# Patient Record
Sex: Female | Born: 1955
Health system: Southern US, Community
[De-identification: ages and names within clinical notes are randomized; demographics above are authoritative.]

## PROBLEM LIST (undated history)

## (undated) DIAGNOSIS — N950 Postmenopausal bleeding: Secondary | ICD-10-CM

## (undated) DIAGNOSIS — R9389 Abnormal findings on diagnostic imaging of other specified body structures: Secondary | ICD-10-CM

## (undated) DIAGNOSIS — Z780 Asymptomatic menopausal state: Secondary | ICD-10-CM

## (undated) DIAGNOSIS — C801 Malignant (primary) neoplasm, unspecified: Secondary | ICD-10-CM

## (undated) DIAGNOSIS — Z9221 Personal history of antineoplastic chemotherapy: Secondary | ICD-10-CM

## (undated) DIAGNOSIS — Z9889 Other specified postprocedural states: Secondary | ICD-10-CM

## (undated) DIAGNOSIS — J383 Other diseases of vocal cords: Secondary | ICD-10-CM

## (undated) DIAGNOSIS — B009 Herpesviral infection, unspecified: Secondary | ICD-10-CM

## (undated) DIAGNOSIS — Z8719 Personal history of other diseases of the digestive system: Secondary | ICD-10-CM

## (undated) DIAGNOSIS — F419 Anxiety disorder, unspecified: Secondary | ICD-10-CM

## (undated) DIAGNOSIS — M8580 Other specified disorders of bone density and structure, unspecified site: Secondary | ICD-10-CM

## (undated) DIAGNOSIS — R112 Nausea with vomiting, unspecified: Secondary | ICD-10-CM

## (undated) DIAGNOSIS — Z923 Personal history of irradiation: Secondary | ICD-10-CM

## (undated) HISTORY — DX: Other diseases of vocal cords: J38.3

## (undated) HISTORY — PX: ANAL FISTULECTOMY: SHX1139

## (undated) HISTORY — PX: KNEE SURGERY: SHX244

## (undated) HISTORY — PX: ENDOMETRIAL BIOPSY: SHX622

## (undated) HISTORY — DX: Abnormal findings on diagnostic imaging of other specified body structures: R93.89

## (undated) HISTORY — PX: OTHER SURGICAL HISTORY: SHX169

## (undated) HISTORY — DX: Other specified disorders of bone density and structure, unspecified site: M85.80

## (undated) HISTORY — DX: Asymptomatic menopausal state: Z78.0

## (undated) HISTORY — DX: Anxiety disorder, unspecified: F41.9

## (undated) HISTORY — DX: Postmenopausal bleeding: N95.0

## (undated) HISTORY — DX: Herpesviral infection, unspecified: B00.9

---

## 2005-01-12 ENCOUNTER — Ambulatory Visit: Payer: Self-pay | Admitting: Gynecology

## 2005-10-20 DIAGNOSIS — Z780 Asymptomatic menopausal state: Secondary | ICD-10-CM

## 2005-10-20 HISTORY — DX: Asymptomatic menopausal state: Z78.0

## 2006-06-18 ENCOUNTER — Ambulatory Visit: Payer: Self-pay | Admitting: Gynecology

## 2006-06-18 ENCOUNTER — Encounter (INDEPENDENT_AMBULATORY_CARE_PROVIDER_SITE_OTHER): Payer: Self-pay | Admitting: Gynecology

## 2006-06-29 ENCOUNTER — Ambulatory Visit: Payer: Self-pay | Admitting: Family Medicine

## 2006-07-13 ENCOUNTER — Ambulatory Visit: Payer: Self-pay | Admitting: Gynecology

## 2007-09-16 ENCOUNTER — Ambulatory Visit: Payer: Self-pay | Admitting: Gynecology

## 2007-09-16 ENCOUNTER — Encounter: Payer: Self-pay | Admitting: Family Medicine

## 2007-10-10 ENCOUNTER — Ambulatory Visit: Payer: Self-pay | Admitting: Gynecology

## 2009-03-02 ENCOUNTER — Ambulatory Visit: Payer: Self-pay | Admitting: Family Medicine

## 2009-03-02 ENCOUNTER — Encounter: Payer: Self-pay | Admitting: Family Medicine

## 2009-03-18 ENCOUNTER — Ambulatory Visit: Payer: Self-pay

## 2010-09-08 ENCOUNTER — Ambulatory Visit: Payer: BC Managed Care – PPO | Admitting: Obstetrics & Gynecology

## 2010-09-08 DIAGNOSIS — Z01419 Encounter for gynecological examination (general) (routine) without abnormal findings: Secondary | ICD-10-CM

## 2010-09-08 DIAGNOSIS — Z1272 Encounter for screening for malignant neoplasm of vagina: Secondary | ICD-10-CM
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
## 2010-10-04 NOTE — Assessment & Plan Note (Signed)
NAMEMANPREET, KEMMER               ACCOUNT NO.:  [id number]   MEDICAL RECORD NO.:  [id number]          PATIENT TYPE:  POB   LOCATION:  [facility] at [facility]         FACILITY:  [facility]   PHYSICIAN:  Ginger Carne, MD DATE OF BIRTH:  March 03, 1956   DATE OF SERVICE:  09/16/2007                                  CLINIC NOTE   Ms. Kimberly Clarke returns today for routine gynecological evaluation.  Her  principal complaint has been hot flashes with her last menses about 6-8  months ago. Preceding this time frame, she was having menses about every  3 months.  She also has vaginal dryness.  She has done some reading  regarding hormone replacement therapy and requested further information.  I had an extensive discussion with said patient regarding pros and cons  of hormone replacement therapy. Literature provided to said patient.  I  will order an FSH, LH and TSH as well, and she will get back to [place] if  she is interested she has no specific cardiac, GU or GI complaints.  She  is due for mammogram which will be scheduled.   The patient's physical examination revealed no specific findings, and  the reader is referred to the check off list.  The patient also was re-  prescribed Xanax 0.25 mg up to one a day as needed for anxiety, #30 with  four refills.  She was also counseled regarding osteoporosis and  recommendation to at least begin with 500 mg daily of calcium carbonate  or calcium citrate.  If she decides not to proceed with hormone  replacement therapy, I will discuss with the patient the option of  bisphosphonates and increasing her calcium to 500 mg with vitamin D  twice a day.           ______________________________  Ginger Carne, MD     SHB/MEDQ  D:  09/16/2007  T:  09/16/2007  Job:  [id number]

## 2010-10-12 ENCOUNTER — Other Ambulatory Visit (INDEPENDENT_AMBULATORY_CARE_PROVIDER_SITE_OTHER): Payer: BC Managed Care – PPO | Admitting: Family Medicine

## 2010-10-12 DIAGNOSIS — N950 Postmenopausal bleeding: Secondary | ICD-10-CM

## 2010-12-05 ENCOUNTER — Ambulatory Visit: Payer: Self-pay

## 2010-12-09 NOTE — Assessment & Plan Note (Signed)
Kimberly Clarke, Kimberly Clarke NO.:  [id number]  MEDICAL RECORD NO.:  [id number]          PATIENT TYPE:  LOCATION:  [facility] at [facility]           FACILITY:  PHYSICIAN:  Tinnie Gens, MD        DATE OF BIRTH:  [birth year]  DATE OF SERVICE:  10/12/2010                                 CLINIC NOTE  CHIEF COMPLAINT:  Postmenopausal bleeding.  HISTORY OF PRESENT ILLNESS:  The patient is a 55 year old nulliparous patient who has previously seen several weeks ago and had postmenopausal bleeding in February 2012.  She has been on HRT for 5 years and then had come off with that.  On her annual visit, she was noted to have a polyp coming from the cervix.  Biopsy of this revealed to be an endometrial polyp that was benign in nature.  She had bleeding from this for several days following removal of that, and we scheduled her back for completion of full endometrial sampling.  On exam, her vitals are as noted in the chart.  She is a well-developed and well-nourished female in no acute stress.  Abdomen soft, nontender, and nondistended.  GU:  Normal external female genitalia.  BUS is normal.  Vagina is pink and rugated. Cervix is nulliparous.  There are no lesions anymore.  PROCEDURE:  Cervix is cleaned with Betadine x2, grasped anteriorly with a single-tooth tenaculum.  It sounded to approximately 6 cm.  Pipelle was passed twice with minimal amount of tissue obtained probably from atrophy.  IMPRESSION:  Postmenopausal bleeding likely related to endometrial polyp, which has been removed, status post endometrial sampling today.  PLAN:  We will call the patient with results of this biopsy.          ______________________________ Tinnie Gens, MD    TP/MEDQ  D:  10/12/2010  T:  10/13/2010  Job:  [id number]
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
## 2012-03-04 ENCOUNTER — Ambulatory Visit (INDEPENDENT_AMBULATORY_CARE_PROVIDER_SITE_OTHER): Payer: BC Managed Care – PPO | Admitting: Family Medicine

## 2012-03-04 ENCOUNTER — Encounter: Payer: Self-pay | Admitting: Family Medicine

## 2012-03-04 VITALS — BP 115/78 | HR 75 | Ht 68.0 in | Wt 157.0 lb

## 2012-03-04 DIAGNOSIS — Z01419 Encounter for gynecological examination (general) (routine) without abnormal findings: Secondary | ICD-10-CM

## 2012-03-04 DIAGNOSIS — Z124 Encounter for screening for malignant neoplasm of cervix: Secondary | ICD-10-CM

## 2012-03-04 DIAGNOSIS — F411 Generalized anxiety disorder: Secondary | ICD-10-CM

## 2012-03-04 DIAGNOSIS — Z1151 Encounter for screening for human papillomavirus (HPV): Secondary | ICD-10-CM

## 2012-03-04 DIAGNOSIS — F419 Anxiety disorder, unspecified: Secondary | ICD-10-CM

## 2012-03-04 MED ORDER — ALPRAZOLAM 0.5 MG PO TABS: 0.2500 mg | ORAL_TABLET | Freq: Every evening | ORAL | Status: DC | PRN

## 2012-03-04 NOTE — Patient Instructions (Signed)
Preventive Care for Adults, Female A healthy lifestyle and preventive care can promote health and wellness. Preventive health guidelines for women include the following key practices.  A routine yearly physical is a good way to check with your caregiver about your health and preventive screening. It is a chance to share any concerns and updates on your health, and to receive a thorough exam.  Visit your dentist for a routine exam and preventive care every 6 months. Brush your teeth twice a day and floss once a day. Good oral hygiene prevents tooth decay and gum disease.  The frequency of eye exams is based on your age, health, family medical history, use of contact lenses, and other factors. Follow your caregiver's recommendations for frequency of eye exams.  Eat a healthy diet. Foods like vegetables, fruits, whole grains, low-fat dairy products, and lean protein foods contain the nutrients you need without too many calories. Decrease your intake of foods high in solid fats, added sugars, and salt. Eat the right amount of calories for you.Get information about a proper diet from your caregiver, if necessary.  Regular physical exercise is one of the most important things you can do for your health. Most adults should get at least 150 minutes of moderate-intensity exercise (any activity that increases your heart rate and causes you to sweat) each week. In addition, most adults need muscle-strengthening exercises on 2 or more days a week.  Maintain a healthy weight. The body mass index (BMI) is a screening tool to identify possible weight problems. It provides an estimate of body fat based on height and weight. Your caregiver can help determine your BMI, and can help you achieve or maintain a healthy weight.For adults 20 years and older:  A BMI below 18.5 is considered underweight.  A BMI of 18.5 to 24.9 is normal.  A BMI of 25 to 29.9 is considered overweight.  A BMI of 30 and above is  considered obese.  Maintain normal blood lipids and cholesterol levels by exercising and minimizing your intake of saturated fat. Eat a balanced diet with plenty of fruit and vegetables. Blood tests for lipids and cholesterol should begin at age 20 and be repeated every 5 years. If your lipid or cholesterol levels are high, you are over 50, or you are at high risk for heart disease, you may need your cholesterol levels checked more frequently.Ongoing high lipid and cholesterol levels should be treated with medicines if diet and exercise are not effective.  If you smoke, find out from your caregiver how to quit. If you do not use tobacco, do not start.  If you are pregnant, do not drink alcohol. If you are breastfeeding, be very cautious about drinking alcohol. If you are not pregnant and choose to drink alcohol, do not exceed 1 drink per day. One drink is considered to be 12 ounces (355 mL) of beer, 5 ounces (148 mL) of wine, or 1.5 ounces (44 mL) of liquor.  Avoid use of street drugs. Do not share needles with anyone. Ask for help if you need support or instructions about stopping the use of drugs.  High blood pressure causes heart disease and increases the risk of stroke. Your blood pressure should be checked at least every 1 to 2 years. Ongoing high blood pressure should be treated with medicines if weight loss and exercise are not effective.  If you are 55 to 54 years old, ask your caregiver if you should take aspirin to prevent strokes.  Diabetes   screening involves taking a blood sample to check your fasting blood sugar level. This should be done once every 3 years, after age 45, if you are within normal weight and without risk factors for diabetes. Testing should be considered at a younger age or be carried out more frequently if you are overweight and have at least 1 risk factor for diabetes.  Breast cancer screening is essential preventive care for women. You should practice "breast  self-awareness." This means understanding the normal appearance and feel of your breasts and may include breast self-examination. Any changes detected, no matter how small, should be reported to a caregiver. Women in their 20s and 30s should have a clinical breast exam (CBE) by a caregiver as part of a regular health exam every 1 to 3 years. After age 40, women should have a CBE every year. Starting at age 40, women should consider having a mammography (breast X-ray test) every year. Women who have a family history of breast cancer should talk to their caregiver about genetic screening. Women at a high risk of breast cancer should talk to their caregivers about having magnetic resonance imaging (MRI) and a mammography every year.  The Pap test is a screening test for cervical cancer. A Pap test can show cell changes on the cervix that might become cervical cancer if left untreated. A Pap test is a procedure in which cells are obtained and examined from the lower end of the uterus (cervix).  Women should have a Pap test starting at age 21.  Between ages 21 and 29, Pap tests should be repeated every 2 years.  Beginning at age 30, you should have a Pap test every 3 years as long as the past 3 Pap tests have been normal.  Some women have medical problems that increase the chance of getting cervical cancer. Talk to your caregiver about these problems. It is especially important to talk to your caregiver if a new problem develops soon after your last Pap test. In these cases, your caregiver may recommend more frequent screening and Pap tests.  The above recommendations are the same for women who have or have not gotten the vaccine for human papillomavirus (HPV).  If you had a hysterectomy for a problem that was not cancer or a condition that could lead to cancer, then you no longer need Pap tests. Even if you no longer need a Pap test, a regular exam is a good idea to make sure no other problems are  starting.  If you are between ages 65 and 70, and you have had normal Pap tests going back 10 years, you no longer need Pap tests. Even if you no longer need a Pap test, a regular exam is a good idea to make sure no other problems are starting.  If you have had past treatment for cervical cancer or a condition that could lead to cancer, you need Pap tests and screening for cancer for at least 20 years after your treatment.  If Pap tests have been discontinued, risk factors (such as a new sexual partner) need to be reassessed to determine if screening should be resumed.  The HPV test is an additional test that may be used for cervical cancer screening. The HPV test looks for the virus that can cause the cell changes on the cervix. The cells collected during the Pap test can be tested for HPV. The HPV test could be used to screen women aged 30 years and older, and should   be used in women of any age who have unclear Pap test results. After the age of 30, women should have HPV testing at the same frequency as a Pap test.  Colorectal cancer can be detected and often prevented. Most routine colorectal cancer screening begins at the age of 50 and continues through age 75. However, your caregiver may recommend screening at an earlier age if you have risk factors for colon cancer. On a yearly basis, your caregiver may provide home test kits to check for hidden blood in the stool. Use of a small camera at the end of a tube, to directly examine the colon (sigmoidoscopy or colonoscopy), can detect the earliest forms of colorectal cancer. Talk to your caregiver about this at age 50, when routine screening begins. Direct examination of the colon should be repeated every 5 to 10 years through age 75, unless early forms of pre-cancerous polyps or small growths are found.  Hepatitis C blood testing is recommended for all people born from 1945 through 1965 and any individual with known risks for hepatitis C.  Practice  safe sex. Use condoms and avoid high-risk sexual practices to reduce the spread of sexually transmitted infections (STIs). STIs include gonorrhea, chlamydia, syphilis, trichomonas, herpes, HPV, and human immunodeficiency virus (HIV). Herpes, HIV, and HPV are viral illnesses that have no cure. They can result in disability, cancer, and death. Sexually active women aged 25 and younger should be checked for chlamydia. Older women with new or multiple partners should also be tested for chlamydia. Testing for other STIs is recommended if you are sexually active and at increased risk.  Osteoporosis is a disease in which the bones lose minerals and strength with aging. This can result in serious bone fractures. The risk of osteoporosis can be identified using a bone density scan. Women ages 65 and over and women at risk for fractures or osteoporosis should discuss screening with their caregivers. Ask your caregiver whether you should take a calcium supplement or vitamin D to reduce the rate of osteoporosis.  Menopause can be associated with physical symptoms and risks. Hormone replacement therapy is available to decrease symptoms and risks. You should talk to your caregiver about whether hormone replacement therapy is right for you.  Use sunscreen with sun protection factor (SPF) of 30 or more. Apply sunscreen liberally and repeatedly throughout the day. You should seek shade when your shadow is shorter than you. Protect yourself by wearing long sleeves, pants, a wide-brimmed hat, and sunglasses year round, whenever you are outdoors.  Once a month, do a whole body skin exam, using a mirror to look at the skin on your back. Notify your caregiver of new moles, moles that have irregular borders, moles that are larger than a pencil eraser, or moles that have changed in shape or color.  Stay current with required immunizations.  Influenza. You need a dose every fall (or winter). The composition of the flu vaccine  changes each year, so being vaccinated once is not enough.  Pneumococcal polysaccharide. You need 1 to 2 doses if you smoke cigarettes or if you have certain chronic medical conditions. You need 1 dose at age 65 (or older) if you have never been vaccinated.  Tetanus, diphtheria, pertussis (Tdap, Td). Get 1 dose of Tdap vaccine if you are younger than age 65, are over 65 and have contact with an infant, are a healthcare worker, are pregnant, or simply want to be protected from whooping cough. After that, you need a Td   booster dose every 10 years. Consult your caregiver if you have not had at least 3 tetanus and diphtheria-containing shots sometime in your life or have a deep or dirty wound.  HPV. You need this vaccine if you are a woman age 26 or younger. The vaccine is given in 3 doses over 6 months.  Measles, mumps, rubella (MMR). You need at least 1 dose of MMR if you were born in 1957 or later. You may also need a second dose.  Meningococcal. If you are age 19 to 21 and a first-year college student living in a residence hall, or have one of several medical conditions, you need to get vaccinated against meningococcal disease. You may also need additional booster doses.  Zoster (shingles). If you are age 60 or older, you should get this vaccine.  Varicella (chickenpox). If you have never had chickenpox or you were vaccinated but received only 1 dose, talk to your caregiver to find out if you need this vaccine.  Hepatitis A. You need this vaccine if you have a specific risk factor for hepatitis A virus infection or you simply wish to be protected from this disease. The vaccine is usually given as 2 doses, 6 to 18 months apart.  Hepatitis B. You need this vaccine if you have a specific risk factor for hepatitis B virus infection or you simply wish to be protected from this disease. The vaccine is given in 3 doses, usually over 6 months. Preventive Services / Frequency Ages 19 to 39  Blood  pressure check.** / Every 1 to 2 years.  Lipid and cholesterol check.** / Every 5 years beginning at age 20.  Clinical breast exam.** / Every 3 years for women in their 20s and 30s.  Pap test.** / Every 2 years from ages 21 through 29. Every 3 years starting at age 30 through age 65 or 70 with a history of 3 consecutive normal Pap tests.  HPV screening.** / Every 3 years from ages 30 through ages 65 to 70 with a history of 3 consecutive normal Pap tests.  Hepatitis C blood test.** / For any individual with known risks for hepatitis C.  Skin self-exam. / Monthly.  Influenza immunization.** / Every year.  Pneumococcal polysaccharide immunization.** / 1 to 2 doses if you smoke cigarettes or if you have certain chronic medical conditions.  Tetanus, diphtheria, pertussis (Tdap, Td) immunization. / A one-time dose of Tdap vaccine. After that, you need a Td booster dose every 10 years.  HPV immunization. / 3 doses over 6 months, if you are 26 and younger.  Measles, mumps, rubella (MMR) immunization. / You need at least 1 dose of MMR if you were born in 1957 or later. You may also need a second dose.  Meningococcal immunization. / 1 dose if you are age 19 to 21 and a first-year college student living in a residence hall, or have one of several medical conditions, you need to get vaccinated against meningococcal disease. You may also need additional booster doses.  Varicella immunization.** / Consult your caregiver.  Hepatitis A immunization.** / Consult your caregiver. 2 doses, 6 to 18 months apart.  Hepatitis B immunization.** / Consult your caregiver. 3 doses usually over 6 months. Ages 40 to 64  Blood pressure check.** / Every 1 to 2 years.  Lipid and cholesterol check.** / Every 5 years beginning at age 20.  Clinical breast exam.** / Every year after age 40.  Mammogram.** / Every year beginning at age 40   and continuing for as long as you are in good health. Consult with your  caregiver.  Pap test.** / Every 3 years starting at age 30 through age 65 or 70 with a history of 3 consecutive normal Pap tests.  HPV screening.** / Every 3 years from ages 30 through ages 65 to 70 with a history of 3 consecutive normal Pap tests.  Fecal occult blood test (FOBT) of stool. / Every year beginning at age 50 and continuing until age 75. You may not need to do this test if you get a colonoscopy every 10 years.  Flexible sigmoidoscopy or colonoscopy.** / Every 5 years for a flexible sigmoidoscopy or every 10 years for a colonoscopy beginning at age 50 and continuing until age 75.  Hepatitis C blood test.** / For all people born from 1945 through 1965 and any individual with known risks for hepatitis C.  Skin self-exam. / Monthly.  Influenza immunization.** / Every year.  Pneumococcal polysaccharide immunization.** / 1 to 2 doses if you smoke cigarettes or if you have certain chronic medical conditions.  Tetanus, diphtheria, pertussis (Tdap, Td) immunization.** / A one-time dose of Tdap vaccine. After that, you need a Td booster dose every 10 years.  Measles, mumps, rubella (MMR) immunization. / You need at least 1 dose of MMR if you were born in 1957 or later. You may also need a second dose.  Varicella immunization.** / Consult your caregiver.  Meningococcal immunization.** / Consult your caregiver.  Hepatitis A immunization.** / Consult your caregiver. 2 doses, 6 to 18 months apart.  Hepatitis B immunization.** / Consult your caregiver. 3 doses, usually over 6 months. Ages 65 and over  Blood pressure check.** / Every 1 to 2 years.  Lipid and cholesterol check.** / Every 5 years beginning at age 20.  Clinical breast exam.** / Every year after age 40.  Mammogram.** / Every year beginning at age 40 and continuing for as long as you are in good health. Consult with your caregiver.  Pap test.** / Every 3 years starting at age 30 through age 65 or 70 with a 3  consecutive normal Pap tests. Testing can be stopped between 65 and 70 with 3 consecutive normal Pap tests and no abnormal Pap or HPV tests in the past 10 years.  HPV screening.** / Every 3 years from ages 30 through ages 65 or 70 with a history of 3 consecutive normal Pap tests. Testing can be stopped between 65 and 70 with 3 consecutive normal Pap tests and no abnormal Pap or HPV tests in the past 10 years.  Fecal occult blood test (FOBT) of stool. / Every year beginning at age 50 and continuing until age 75. You may not need to do this test if you get a colonoscopy every 10 years.  Flexible sigmoidoscopy or colonoscopy.** / Every 5 years for a flexible sigmoidoscopy or every 10 years for a colonoscopy beginning at age 50 and continuing until age 75.  Hepatitis C blood test.** / For all people born from 1945 through 1965 and any individual with known risks for hepatitis C.  Osteoporosis screening.** / A one-time screening for women ages 65 and over and women at risk for fractures or osteoporosis.  Skin self-exam. / Monthly.  Influenza immunization.** / Every year.  Pneumococcal polysaccharide immunization.** / 1 dose at age 65 (or older) if you have never been vaccinated.  Tetanus, diphtheria, pertussis (Tdap, Td) immunization. / A one-time dose of Tdap vaccine if you are over   65 and have contact with an infant, are a healthcare worker, or simply want to be protected from whooping cough. After that, you need a Td booster dose every 10 years.  Varicella immunization.** / Consult your caregiver.  Meningococcal immunization.** / Consult your caregiver.  Hepatitis A immunization.** / Consult your caregiver. 2 doses, 6 to 18 months apart.  Hepatitis B immunization.** / Check with your caregiver. 3 doses, usually over 6 months. ** Family history and personal history of risk and conditions may change your caregiver's recommendations. Document Released: 07/04/2001 Document Revised: 07/31/2011  Document Reviewed: 10/03/2010 [employer] Patient Information 2013 [employer].  

## 2012-03-04 NOTE — Progress Notes (Signed)
  Subjective:     Kimberly Clarke is a 56 y.o. female and is here for a comprehensive physical exam. The patient reports no problems.  No cycles for some time.  Removed cervical polyp at last year's annual exam.  Has some hemorrhoids, on fiber and stool softener.  History   Social History  . Marital Status: Married    Spouse Name: N/A    Number of Children: N/A  . Years of Education: N/A   Occupational History  . Not on file.   Social History Main Topics  . Smoking status: Never Smoker   . Smokeless tobacco: Not on file  . Alcohol Use: Not on file     occasion  . Drug Use: No     In early 20  . Sexually Active: Yes -- Female partner(s)    Birth Control/ Protection: Post-menopausal   Other Topics Concern  . Not on file   Social History Narrative  . No narrative on file   Health Maintenance  Topic Date Due  . Pap Smear  02/29/1976  . Tetanus/tdap  02/28/1977  . Mammogram  02/29/2008  . Colonoscopy  02/29/2008  . Influenza Vaccine  01/21/2012    The following portions of the patient's history were reviewed and updated as appropriate: allergies, current medications, past family history, past medical history, past social history, past surgical history and problem list.  Review of Systems A comprehensive review of systems was negative.   Objective:    BP 115/78  Pulse 75  Ht 5\' 8"  (1.727 m)  Wt 157 lb (71.215 kg)  BMI 23.87 kg/m2 General appearance: alert, cooperative and appears stated age Neck: no adenopathy, supple, symmetrical, trachea midline and thyroid not enlarged, symmetric, no tenderness/mass/nodules Lungs: clear to auscultation bilaterally Breasts: normal appearance, no masses or tenderness Heart: regular rate and rhythm, S1, S2 normal, no murmur, click, rub or gallop Abdomen: soft, non-tender; bowel sounds normal; no masses,  no organomegaly Pelvic: cervix normal in appearance, external genitalia normal, no adnexal masses or tenderness, no cervical motion  tenderness, uterus normal size, shape, and consistency and vagina normal without discharge Extremities: extremities normal, atraumatic, no cyanosis or edema Pulses: 2+ and symmetric Skin: Skin color, texture, turgor normal. No rashes or lesions Lymph nodes: Cervical, supraclavicular, and axillary nodes normal. Neurologic: Grossly normal    Assessment:    Healthy female exam. Postmenopausal     Plan:    Pap smear Mammogram Referral for colonoscopy See After Visit Summary for Counseling Recommendations

## 2012-03-19 ENCOUNTER — Ambulatory Visit: Payer: Self-pay

## 2012-03-20 ENCOUNTER — Encounter: Payer: Self-pay | Admitting: Family Medicine

## 2013-03-11 ENCOUNTER — Telehealth: Payer: Self-pay | Admitting: *Deleted

## 2013-03-11 NOTE — Telephone Encounter (Signed)
Patient would like rx for xanax called back into her pharmacy as she never had to get it filled from her last visit and it has now expired.  It was last filled in 2012 according to the pharmacist.  She is very upset as her husband passed away suddenly about two months ago from a heart attack while they were at home.

## 2013-10-07 ENCOUNTER — Ambulatory Visit (INDEPENDENT_AMBULATORY_CARE_PROVIDER_SITE_OTHER): Payer: BC Managed Care – PPO | Admitting: Obstetrics & Gynecology

## 2013-10-07 ENCOUNTER — Encounter: Payer: Self-pay | Admitting: Obstetrics & Gynecology

## 2013-10-07 VITALS — BP 137/86 | HR 80 | Resp 20 | Ht 68.0 in | Wt 161.0 lb

## 2013-10-07 DIAGNOSIS — G4700 Insomnia, unspecified: Secondary | ICD-10-CM

## 2013-10-07 DIAGNOSIS — Z01419 Encounter for gynecological examination (general) (routine) without abnormal findings: Secondary | ICD-10-CM

## 2013-10-07 DIAGNOSIS — Z1151 Encounter for screening for human papillomavirus (HPV): Secondary | ICD-10-CM

## 2013-10-07 DIAGNOSIS — Z124 Encounter for screening for malignant neoplasm of cervix: Secondary | ICD-10-CM

## 2013-10-07 MED ORDER — ALPRAZOLAM 0.5 MG PO TABS: 0.2500 mg | ORAL_TABLET | Freq: Every evening | ORAL | Status: DC | PRN

## 2013-10-07 NOTE — Progress Notes (Signed)
    GYNECOLOGY CLINIC ANNUAL PREVENTATIVE CARE ENCOUNTER NOTE  Subjective:     Kimberly Clarke is a 58 y.o. G0P0000 female here for a routine annual gynecologic exam.  Current complaints: no GYN concerns.  Lost her husband in August 2014; having a hard time dealing with this. Requests Xanax refill for help with anxiety and insomnia. Went to grief counseling, declines any further help.   Gynecologic History No LMP recorded. Patient is postmenopausal. Last Pap: 03/04/12. Results were: normal Last mammogram: 2013. Results were: normal Has not scheduled colonoscopy yet, does not referral now.  The following portions of the patient's history were reviewed and updated as appropriate: allergies, current medications, past family history, past medical history, past social history, past surgical history and problem list.   Review of Systems Pertinent items are noted in HPI.    Objective:   BP 137/86  Pulse 80  Resp 20  Ht 5\' 8"  (1.727 m)  Wt 161 lb (73.029 kg)  BMI 24.49 kg/m2 GENERAL: Well-developed, well-nourished female in no acute distress.  HEENT: Normocephalic, atraumatic. Sclerae anicteric.  NECK: Supple. Normal thyroid.  LUNGS: Clear to auscultation bilaterally.  HEART: Regular rate and rhythm. BREASTS: Symmetric in size. No masses, skin changes, nipple drainage, or lymphadenopathy. ABDOMEN: Soft, nontender, nondistended. No organomegaly. PELVIC: Normal external female genitalia. Vagina is pink and rugated.  Normal discharge. Normal cervix contour. Pap smear obtained. Uterus is normal in size. No adnexal mass or tenderness.  EXTREMITIES: No cyanosis, clubbing, or edema, 2+ distal pulses.   Assessment:   Annual gynecologic examination   Plan:   Pap done, will follow up results and manage accordingly. Mammogram scheduled Xanax refilled. Patient told to call if she decides to see a counselor. Routine preventative health maintenance measures emphasized   Verita Schneiders, MD,  [place] [facility] [place], [place]

## 2013-10-07 NOTE — Patient Instructions (Addendum)
RE: MyChart  Dear Ms. Kimberly Clarke  We are excited to introduce MyChart, a new best-in-class service that provides you online access to important information in your electronic medical record. We want to make it easier for you to view your health information - all in one secure location - when and where you need it. We expect MyChart will enhance the quality of care and service we provide. Use the activation code below to enroll in MyChart online at [email or web address]  When you register for MyChart, you can:    View your test results.   Communicate securely with your physician's office.    View your medical history, allergies, medications, and immunizations.   Conveniently print information such as your medication lists.  If you are age 56 or older and want a member of your family to have access to your record, you must provide written consent by completing a proxy form available at our facility. Please speak to our clinical staff about guidelines regarding accounts for patients younger than age 88.  As you activate your MyChart account and need any technical assistance, please call the MyChart technical support line at [phone]) or email your question to [email or web address]. If you email your question(s), please include your name, a return phone number and the best time to reach you.  Thank you for using MyChart as your new health and wellness resource!  MyChart Activation Code:  [email or web address] Expires: 12/06/2013  2:31 PM      Preventive Care for Adults, Female A healthy lifestyle and preventive care can promote health and wellness. Preventive health guidelines for women include the following key practices.  A routine yearly physical is a good way to check with your health care provider about your health and preventive screening. It is a chance to share any concerns and updates on your health and to receive a thorough exam.  Visit your dentist for a  routine exam and preventive care every 6 months. Brush your teeth twice a day and floss once a day. Good oral hygiene prevents tooth decay and gum disease.  The frequency of eye exams is based on your age, health, family medical history, use of contact lenses, and other factors. Follow your health care provider's recommendations for frequency of eye exams.  Eat a healthy diet. Foods like vegetables, fruits, whole grains, low-fat dairy products, and lean protein foods contain the nutrients you need without too many calories. Decrease your intake of foods high in solid fats, added sugars, and salt. Eat the right amount of calories for you.Get information about a proper diet from your health care provider, if necessary.  Regular physical exercise is one of the most important things you can do for your health. Most adults should get at least 150 minutes of moderate-intensity exercise (any activity that increases your heart rate and causes you to sweat) each week. In addition, most adults need muscle-strengthening exercises on 2 or more days a week.  Maintain a healthy weight. The body mass index (BMI) is a screening tool to identify possible weight problems. It provides an estimate of body fat based on height and weight. Your health care provider can find your BMI, and can help you achieve or maintain a healthy weight.For adults 20 years and older:  A BMI below 18.5 is considered underweight.  A BMI of 18.5 to 24.9 is normal.  A BMI of 25 to 29.9 is considered overweight.  A BMI of 30 and above is considered obese.  Maintain normal blood  lipids and cholesterol levels by exercising and minimizing your intake of saturated fat. Eat a balanced diet with plenty of fruit and vegetables. Blood tests for lipids and cholesterol should begin at age 56 and be repeated every 5 years. If your lipid or cholesterol levels are high, you are over 50, or you are at high risk for heart disease, you may need your  cholesterol levels checked more frequently.Ongoing high lipid and cholesterol levels should be treated with medicines if diet and exercise are not working.  If you smoke, find out from your health care provider how to quit. If you do not use tobacco, do not start.  Lung cancer screening is recommended for adults aged 56 80 years who are at high risk for developing lung cancer because of a history of smoking. A yearly low-dose CT scan of the lungs is recommended for people who have at least a 30-pack-year history of smoking and are a current smoker or have quit within the past 15 years. A pack year of smoking is smoking an average of 1 pack of cigarettes a day for 1 year (for example: 1 pack a day for 30 years or 2 packs a day for 15 years). Yearly screening should continue until the smoker has stopped smoking for at least 15 years. Yearly screening should be stopped for people who develop a health problem that would prevent them from having lung cancer treatment.  If you are pregnant, do not drink alcohol. If you are breastfeeding, be very cautious about drinking alcohol. If you are not pregnant and choose to drink alcohol, do not have more than 1 drink per day. One drink is considered to be 12 ounces (355 mL) of beer, 5 ounces (148 mL) of wine, or 1.5 ounces (44 mL) of liquor.  Avoid use of street drugs. Do not share needles with anyone. Ask for help if you need support or instructions about stopping the use of drugs.  High blood pressure causes heart disease and increases the risk of stroke. Your blood pressure should be checked at least every 1 to 2 years. Ongoing high blood pressure should be treated with medicines if weight loss and exercise do not work.  If you are 56 56 years old, ask your health care provider if you should take aspirin to prevent strokes.  Diabetes screening involves taking a blood sample to check your fasting blood sugar level. This should be done once every 3 years, after  age 14, if you are within normal weight and without risk factors for diabetes. Testing should be considered at a younger age or be carried out more frequently if you are overweight and have at least 1 risk factor for diabetes.  Breast cancer screening is essential preventive care for women. You should practice "breast self-awareness." This means understanding the normal appearance and feel of your breasts and may include breast self-examination. Any changes detected, no matter how small, should be reported to a health care provider. Women in their 3s and 30s should have a clinical breast exam (CBE) by a health care provider as part of a regular health exam every 1 to 3 years. After age 80, women should have a CBE every year. Starting at age 30, women should consider having a mammogram (breast X-ray test) every year. Women who have a family history of breast cancer should talk to their health care provider about genetic screening. Women at a high risk of breast cancer should talk to their health care providers about  having an MRI and a mammogram every year.  Breast cancer gene (BRCA)-related cancer risk assessment is recommended for women who have family members with BRCA-related cancers. BRCA-related cancers include breast, ovarian, tubal, and peritoneal cancers. Having family members with these cancers may be associated with an increased risk for harmful changes (mutations) in the breast cancer genes BRCA1 and BRCA2. Results of the assessment will determine the need for genetic counseling and BRCA1 and BRCA2 testing.  The Pap test is a screening test for cervical cancer. A Pap test can show cell changes on the cervix that might become cervical cancer if left untreated. A Pap test is a procedure in which cells are obtained and examined from the lower end of the uterus (cervix).  Women should have a Pap test starting at age 25.  Between ages 28 and 33, Pap tests should be repeated every 2  years.  Beginning at age 27, you should have a Pap test every 3 years as long as the past 3 Pap tests have been normal.  Some women have medical problems that increase the chance of getting cervical cancer. Talk to your health care provider about these problems. It is especially important to talk to your health care provider if a new problem develops soon after your last Pap test. In these cases, your health care provider may recommend more frequent screening and Pap tests.  The above recommendations are the same for women who have or have not gotten the vaccine for human papillomavirus (HPV).  If you had a hysterectomy for a problem that was not cancer or a condition that could lead to cancer, then you no longer need Pap tests. Even if you no longer need a Pap test, a regular exam is a good idea to make sure no other problems are starting.  If you are between ages 29 and 45 years, and you have had normal Pap tests going back 10 years, you no longer need Pap tests. Even if you no longer need a Pap test, a regular exam is a good idea to make sure no other problems are starting.  If you have had past treatment for cervical cancer or a condition that could lead to cancer, you need Pap tests and screening for cancer for at least 20 years after your treatment.  If Pap tests have been discontinued, risk factors (such as a new sexual partner) need to be reassessed to determine if screening should be resumed.  The HPV test is an additional test that may be used for cervical cancer screening. The HPV test looks for the virus that can cause the cell changes on the cervix. The cells collected during the Pap test can be tested for HPV. The HPV test could be used to screen women aged 36 years and older, and should be used in women of any age who have unclear Pap test results. After the age of 60, women should have HPV testing at the same frequency as a Pap test.  Colorectal cancer can be detected and often  prevented. Most routine colorectal cancer screening begins at the age of 80 years and continues through age 72 years. However, your health care provider may recommend screening at an earlier age if you have risk factors for colon cancer. On a yearly basis, your health care provider may provide home test kits to check for hidden blood in the stool. Use of a small camera at the end of a tube, to directly examine the colon (sigmoidoscopy or  colonoscopy), can detect the earliest forms of colorectal cancer. Talk to your health care provider about this at age 46, when routine screening begins. Direct exam of the colon should be repeated every 5 10 years through age 27 years, unless early forms of pre-cancerous polyps or small growths are found.  People who are at an increased risk for hepatitis B should be screened for this virus. You are considered at high risk for hepatitis B if:  You were born in a country where hepatitis B occurs often. Talk with your health care provider about which countries are considered high risk.  Your parents were born in a high-risk country and you have not received a shot to protect against hepatitis B (hepatitis B vaccine).  You have HIV or AIDS.  You use needles to inject street drugs.  You live with, or have sex with, someone who has Hepatitis B.  You get hemodialysis treatment.  You take certain medicines for conditions like cancer, organ transplantation, and autoimmune conditions.  Hepatitis C blood testing is recommended for all people born from [birth year] through 1963 and any individual with known risks for hepatitis C.  Practice safe sex. Use condoms and avoid high-risk sexual practices to reduce the spread of sexually transmitted infections (STIs). STIs include gonorrhea, chlamydia, syphilis, trichomonas, herpes, HPV, and human immunodeficiency virus (HIV). Herpes, HIV, and HPV are viral illnesses that have no cure. They can result in disability, cancer, and death.  Sexually active women aged 17 years and younger should be checked for chlamydia. Older women with new or multiple partners should also be tested for chlamydia. Testing for other STIs is recommended if you are sexually active and at increased risk.  Osteoporosis is a disease in which the bones lose minerals and strength with aging. This can result in serious bone fractures or breaks. The risk of osteoporosis can be identified using a bone density scan. Women ages 38 years and over and women at risk for fractures or osteoporosis should discuss screening with their health care providers. Ask your health care provider whether you should take a calcium supplement or vitamin D to reduce the rate of osteoporosis.  Menopause can be associated with physical symptoms and risks. Hormone replacement therapy is available to decrease symptoms and risks. You should talk to your health care provider about whether hormone replacement therapy is right for you.  Use sunscreen. Apply sunscreen liberally and repeatedly throughout the day. You should seek shade when your shadow is shorter than you. Protect yourself by wearing long sleeves, pants, a wide-brimmed hat, and sunglasses year round, whenever you are outdoors.  Once a month, do a whole body skin exam, using a mirror to look at the skin on your back. Tell your health care provider of new moles, moles that have irregular borders, moles that are larger than a pencil eraser, or moles that have changed in shape or color.  Stay current with required vaccines (immunizations).  Influenza vaccine. All adults should be immunized every year.  Tetanus, diphtheria, and acellular pertussis (Td, Tdap) vaccine. Pregnant women should receive 1 dose of Tdap vaccine during each pregnancy. The dose should be obtained regardless of the length of time since the last dose. Immunization is preferred during the 27th 36th week of gestation. An adult who has not previously received Tdap or  who does not know her vaccine status should receive 1 dose of Tdap. This initial dose should be followed by tetanus and diphtheria toxoids (Td) booster doses every 10  years. Adults with an unknown or incomplete history of completing a 3-dose immunization series with Td-containing vaccines should begin or complete a primary immunization series including a Tdap dose. Adults should receive a Td booster every 10 years.  Varicella vaccine. An adult without evidence of immunity to varicella should receive 2 doses or a second dose if she has previously received 1 dose. Pregnant females who do not have evidence of immunity should receive the first dose after pregnancy. This first dose should be obtained before leaving the health care facility. The second dose should be obtained 4 8 weeks after the first dose.  Human papillomavirus (HPV) vaccine. Females aged 63 26 years who have not received the vaccine previously should obtain the 3-dose series. The vaccine is not recommended for use in pregnant females. However, pregnancy testing is not needed before receiving a dose. If a female is found to be pregnant after receiving a dose, no treatment is needed. In that case, the remaining doses should be delayed until after the pregnancy. Immunization is recommended for any person with an immunocompromised condition through the age of 95 years if she did not get any or all doses earlier. During the 3-dose series, the second dose should be obtained 4 8 weeks after the first dose. The third dose should be obtained 24 weeks after the first dose and 16 weeks after the second dose.  Zoster vaccine. One dose is recommended for adults aged 32 years or older unless certain conditions are present.  Measles, mumps, and rubella (MMR) vaccine. Adults born before [birth year] generally are considered immune to measles and mumps. Adults born in [birth year] or later should have 1 or more doses of MMR vaccine unless there is a contraindication to the  vaccine or there is laboratory evidence of immunity to each of the three diseases. A routine second dose of MMR vaccine should be obtained at least 28 days after the first dose for students attending postsecondary schools, health care workers, or international travelers. People who received inactivated measles vaccine or an unknown type of measles vaccine during 1963 1967 should receive 2 doses of MMR vaccine. People who received inactivated mumps vaccine or an unknown type of mumps vaccine before 1979 and are at high risk for mumps infection should consider immunization with 2 doses of MMR vaccine. For females of childbearing age, rubella immunity should be determined. If there is no evidence of immunity, females who are not pregnant should be vaccinated. If there is no evidence of immunity, females who are pregnant should delay immunization until after pregnancy. Unvaccinated health care workers born before [birth year] who lack laboratory evidence of measles, mumps, or rubella immunity or laboratory confirmation of disease should consider measles and mumps immunization with 2 doses of MMR vaccine or rubella immunization with 1 dose of MMR vaccine.  Pneumococcal 13-valent conjugate (PCV13) vaccine. When indicated, a person who is uncertain of her immunization history and has no record of immunization should receive the PCV13 vaccine. An adult aged 29 years or older who has certain medical conditions and has not been previously immunized should receive 1 dose of PCV13 vaccine. This PCV13 should be followed with a dose of pneumococcal polysaccharide (PPSV23) vaccine. The PPSV23 vaccine dose should be obtained at least 8 weeks after the dose of PCV13 vaccine. An adult aged 67 years or older who has certain medical conditions and previously received 1 or more doses of PPSV23 vaccine should receive 1 dose of PCV13. The PCV13 vaccine dose should be obtained  1 or more years after the last PPSV23 vaccine dose.  Pneumococcal  polysaccharide (PPSV23) vaccine. When PCV13 is also indicated, PCV13 should be obtained first. All adults aged 31 years and older should be immunized. An adult younger than age 17 years who has certain medical conditions should be immunized. Any person who resides in a nursing home or long-term care facility should be immunized. An adult smoker should be immunized. People with an immunocompromised condition and certain other conditions should receive both PCV13 and PPSV23 vaccines. People with human immunodeficiency virus (HIV) infection should be immunized as soon as possible after diagnosis. Immunization during chemotherapy or radiation therapy should be avoided. Routine use of PPSV23 vaccine is not recommended for American Indians, [place] Natives, or people younger than 65 years unless there are medical conditions that require PPSV23 vaccine. When indicated, people who have unknown immunization and have no record of immunization should receive PPSV23 vaccine. One-time revaccination 5 years after the first dose of PPSV23 is recommended for people aged 59 64 years who have chronic kidney failure, nephrotic syndrome, asplenia, or immunocompromised conditions. People who received 1 2 doses of PPSV23 before age 58 years should receive another dose of PPSV23 vaccine at age 33 years or later if at least 5 years have passed since the previous dose. Doses of PPSV23 are not needed for people immunized with PPSV23 at or after age 77 years.  Meningococcal vaccine. Adults with asplenia or persistent complement component deficiencies should receive 2 doses of quadrivalent meningococcal conjugate (MenACWY-D) vaccine. The doses should be obtained at least 2 months apart. Microbiologists working with certain meningococcal bacteria, [employer] recruits, people at risk during an outbreak, and people who travel to or live in countries with a high rate of meningitis should be immunized. A first-year college student up through age 48  years who is living in a residence hall should receive a dose if she did not receive a dose on or after her 16th birthday. Adults who have certain high-risk conditions should receive one or more doses of vaccine.  Hepatitis A vaccine. Adults who wish to be protected from this disease, have certain high-risk conditions, work with hepatitis A-infected animals, work in hepatitis A research labs, or travel to or work in countries with a high rate of hepatitis A should be immunized. Adults who were previously unvaccinated and who anticipate close contact with an international adoptee during the first 60 days after arrival in the [place] from a country with a high rate of hepatitis A should be immunized.  Hepatitis B vaccine. Adults who wish to be protected from this disease, have certain high-risk conditions, may be exposed to blood or other infectious body fluids, are household contacts or sex partners of hepatitis B positive people, are clients or workers in certain care facilities, or travel to or work in countries with a high rate of hepatitis B should be immunized.  Haemophilus influenzae type b (Hib) vaccine. A previously unvaccinated person with asplenia or sickle cell disease or having a scheduled splenectomy should receive 1 dose of Hib vaccine. Regardless of previous immunization, a recipient of a hematopoietic stem cell transplant should receive a 3-dose series 6 12 months after her successful transplant. Hib vaccine is not recommended for adults with HIV infection. Preventive Services / Frequency Ages 30 to 39years  Blood pressure check.** / Every 1 to 2 years.  Lipid and cholesterol check.** / Every 5 years beginning at age 26.  Clinical breast exam.** / Every 3 years for  women in their 54s and 8s.  BRCA-related cancer risk assessment.** / For women who have family members with a BRCA-related cancer (breast, ovarian, tubal, or peritoneal cancers).  Pap test.** / Every 2 years from  ages 7 through 33. Every 3 years starting at age 25 through age 82 or 32 with a history of 3 consecutive normal Pap tests.  HPV screening.** / Every 3 years from ages 6 through ages 4 to 80 with a history of 3 consecutive normal Pap tests.  Hepatitis C blood test.** / For any individual with known risks for hepatitis C.  Skin self-exam. / Monthly.  Influenza vaccine. / Every year.  Tetanus, diphtheria, and acellular pertussis (Tdap, Td) vaccine.** / Consult your health care provider. Pregnant women should receive 1 dose of Tdap vaccine during each pregnancy. 1 dose of Td every 10 years.  Varicella vaccine.** / Consult your health care provider. Pregnant females who do not have evidence of immunity should receive the first dose after pregnancy.  HPV vaccine. / 3 doses over 6 months, if 70 and younger. The vaccine is not recommended for use in pregnant females. However, pregnancy testing is not needed before receiving a dose.  Measles, mumps, rubella (MMR) vaccine.** / You need at least 1 dose of MMR if you were born in 1957 or later. You may also need a 2nd dose. For females of childbearing age, rubella immunity should be determined. If there is no evidence of immunity, females who are not pregnant should be vaccinated. If there is no evidence of immunity, females who are pregnant should delay immunization until after pregnancy.  Pneumococcal 13-valent conjugate (PCV13) vaccine.** / Consult your health care provider.  Pneumococcal polysaccharide (PPSV23) vaccine.** / 1 to 2 doses if you smoke cigarettes or if you have certain conditions.  Meningococcal vaccine.** / 1 dose if you are age 69 to 64 years and a Market researcher living in a residence hall, or have one of several medical conditions, you need to get vaccinated against meningococcal disease. You may also need additional booster doses.  Hepatitis A vaccine.** / Consult your health care provider.  Hepatitis B vaccine.** /  Consult your health care provider.  Haemophilus influenzae type b (Hib) vaccine.** / Consult your health care provider. Ages 82 to 64years  Blood pressure check.** / Every 1 to 2 years.  Lipid and cholesterol check.** / Every 5 years beginning at age 20 years.  Lung cancer screening. / Every year if you are aged 50 80 years and have a 30-pack-year history of smoking and currently smoke or have quit within the past 15 years. Yearly screening is stopped once you have quit smoking for at least 15 years or develop a health problem that would prevent you from having lung cancer treatment.  Clinical breast exam.** / Every year after age 39 years.  BRCA-related cancer risk assessment.** / For women who have family members with a BRCA-related cancer (breast, ovarian, tubal, or peritoneal cancers).  Mammogram.** / Every year beginning at age 28 years and continuing for as long as you are in good health. Consult with your health care provider.  Pap test.** / Every 3 years starting at age 49 years through age 46 or 34 years with a history of 3 consecutive normal Pap tests.  HPV screening.** / Every 3 years from ages 66 years through ages 36 to 58 years with a history of 3 consecutive normal Pap tests.  Fecal occult blood test (FOBT) of stool. / Every year  beginning at age 104 years and continuing until age 28 years. You may not need to do this test if you get a colonoscopy every 10 years.  Flexible sigmoidoscopy or colonoscopy.** / Every 5 years for a flexible sigmoidoscopy or every 10 years for a colonoscopy beginning at age 88 years and continuing until age 16 years.  Hepatitis C blood test.** / For all people born from [birth year] through 1963 and any individual with known risks for hepatitis C.  Skin self-exam. / Monthly.  Influenza vaccine. / Every year.  Tetanus, diphtheria, and acellular pertussis (Tdap/Td) vaccine.** / Consult your health care provider. Pregnant women should receive 1 dose of  Tdap vaccine during each pregnancy. 1 dose of Td every 10 years.  Varicella vaccine.** / Consult your health care provider. Pregnant females who do not have evidence of immunity should receive the first dose after pregnancy.  Zoster vaccine.** / 1 dose for adults aged 57 years or older.  Measles, mumps, rubella (MMR) vaccine.** / You need at least 1 dose of MMR if you were born in 1957 or later. You may also need a 2nd dose. For females of childbearing age, rubella immunity should be determined. If there is no evidence of immunity, females who are not pregnant should be vaccinated. If there is no evidence of immunity, females who are pregnant should delay immunization until after pregnancy.  Pneumococcal 13-valent conjugate (PCV13) vaccine.** / Consult your health care provider.  Pneumococcal polysaccharide (PPSV23) vaccine.** / 1 to 2 doses if you smoke cigarettes or if you have certain conditions.  Meningococcal vaccine.** / Consult your health care provider.  Hepatitis A vaccine.** / Consult your health care provider.  Hepatitis B vaccine.** / Consult your health care provider.  Haemophilus influenzae type b (Hib) vaccine.** / Consult your health care provider. Ages 69 years and over  Blood pressure check.** / Every 1 to 2 years.  Lipid and cholesterol check.** / Every 5 years beginning at age 10 years.  Lung cancer screening. / Every year if you are aged 45 80 years and have a 30-pack-year history of smoking and currently smoke or have quit within the past 15 years. Yearly screening is stopped once you have quit smoking for at least 15 years or develop a health problem that would prevent you from having lung cancer treatment.  Clinical breast exam.** / Every year after age 31 years.  BRCA-related cancer risk assessment.** / For women who have family members with a BRCA-related cancer (breast, ovarian, tubal, or peritoneal cancers).  Mammogram.** / Every year beginning at age 70  years and continuing for as long as you are in good health. Consult with your health care provider.  Pap test.** / Every 3 years starting at age 36 years through age 59 or 60 years with 3 consecutive normal Pap tests. Testing can be stopped between 65 and 70 years with 3 consecutive normal Pap tests and no abnormal Pap or HPV tests in the past 10 years.  HPV screening.** / Every 3 years from ages 34 years through ages 59 or 80 years with a history of 3 consecutive normal Pap tests. Testing can be stopped between 65 and 70 years with 3 consecutive normal Pap tests and no abnormal Pap or HPV tests in the past 10 years.  Fecal occult blood test (FOBT) of stool. / Every year beginning at age 62 years and continuing until age 59 years. You may not need to do this test if you get a colonoscopy every  10 years.  Flexible sigmoidoscopy or colonoscopy.** / Every 5 years for a flexible sigmoidoscopy or every 10 years for a colonoscopy beginning at age 54 years and continuing until age 58 years.  Hepatitis C blood test.** / For all people born from [birth year] through 1963 and any individual with known risks for hepatitis C.  Osteoporosis screening.** / A one-time screening for women ages 44 years and over and women at risk for fractures or osteoporosis.  Skin self-exam. / Monthly.  Influenza vaccine. / Every year.  Tetanus, diphtheria, and acellular pertussis (Tdap/Td) vaccine.** / 1 dose of Td every 10 years.  Varicella vaccine.** / Consult your health care provider.  Zoster vaccine.** / 1 dose for adults aged 33 years or older.  Pneumococcal 13-valent conjugate (PCV13) vaccine.** / Consult your health care provider.  Pneumococcal polysaccharide (PPSV23) vaccine.** / 1 dose for all adults aged 22 years and older.  Meningococcal vaccine.** / Consult your health care provider.  Hepatitis A vaccine.** / Consult your health care provider.  Hepatitis B vaccine.** / Consult your health care  provider.  Haemophilus influenzae type b (Hib) vaccine.** / Consult your health care provider. ** Family history and personal history of risk and conditions may change your health care provider's recommendations. Document Released: 07/04/2001 Document Revised: 02/26/2013 Document Reviewed: 10/03/2010 [facility] Patient Information 2014 [place], [place].

## 2014-01-23 ENCOUNTER — Ambulatory Visit: Payer: Self-pay | Admitting: Family Medicine

## 2014-04-13 ENCOUNTER — Other Ambulatory Visit: Payer: Self-pay | Admitting: *Deleted

## 2014-04-13 DIAGNOSIS — G4700 Insomnia, unspecified: Secondary | ICD-10-CM

## 2014-04-13 MED ORDER — ALPRAZOLAM 0.5 MG PO TABS: 0.2500 mg | ORAL_TABLET | Freq: Every evening | ORAL | Status: DC | PRN

## 2014-04-13 NOTE — Telephone Encounter (Signed)
Ok per Dr. Harolyn Rutherford to refill xanax for patient.

## 2015-03-23 ENCOUNTER — Ambulatory Visit (INDEPENDENT_AMBULATORY_CARE_PROVIDER_SITE_OTHER): Payer: Commercial Managed Care - HMO | Admitting: Family Medicine

## 2015-03-23 ENCOUNTER — Encounter: Payer: Self-pay | Admitting: Family Medicine

## 2015-03-23 VITALS — BP 133/79 | HR 51 | Ht 69.0 in | Wt 167.0 lb

## 2015-03-23 DIAGNOSIS — Z124 Encounter for screening for malignant neoplasm of cervix: Secondary | ICD-10-CM | POA: Diagnosis not present

## 2015-03-23 DIAGNOSIS — Z1151 Encounter for screening for human papillomavirus (HPV): Secondary | ICD-10-CM

## 2015-03-23 DIAGNOSIS — Z01419 Encounter for gynecological examination (general) (routine) without abnormal findings: Secondary | ICD-10-CM

## 2015-03-23 DIAGNOSIS — G4700 Insomnia, unspecified: Secondary | ICD-10-CM

## 2015-03-23 DIAGNOSIS — B009 Herpesviral infection, unspecified: Secondary | ICD-10-CM

## 2015-03-23 LAB — COMPREHENSIVE METABOLIC PANEL
ALT: 25 U/L (ref 6–29)
AST: 25 U/L (ref 10–35)
Albumin: 4.5 g/dL (ref 3.6–5.1)
Alkaline Phosphatase: 117 U/L (ref 33–130)
BILIRUBIN TOTAL: 0.4 mg/dL (ref 0.2–1.2)
BUN: 15 mg/dL (ref 7–25)
CALCIUM: 9.5 mg/dL (ref 8.6–10.4)
CO2: 29 mmol/L (ref 20–31)
Chloride: 103 mmol/L (ref 98–110)
Creat: 0.91 mg/dL (ref 0.50–1.05)
GLUCOSE: 89 mg/dL (ref 65–99)
POTASSIUM: 4.4 mmol/L (ref 3.5–5.3)
Sodium: 140 mmol/L (ref 135–146)
Total Protein: 7.4 g/dL (ref 6.1–8.1)

## 2015-03-23 LAB — CBC
HCT: 41.8 % (ref 36.0–46.0)
Hemoglobin: 14.3 g/dL (ref 12.0–15.0)
MCH: 29.9 pg (ref 26.0–34.0)
MCHC: 34.2 g/dL (ref 30.0–36.0)
MCV: 87.3 fL (ref 78.0–100.0)
MPV: 10.4 fL (ref 8.6–12.4)
PLATELETS: 335 10*3/uL (ref 150–400)
RBC: 4.79 MIL/uL (ref 3.87–5.11)
RDW: 13.2 % (ref 11.5–15.5)
WBC: 8.7 10*3/uL (ref 4.0–10.5)

## 2015-03-23 LAB — LIPID PANEL
CHOLESTEROL: 195 mg/dL (ref 125–200)
HDL: 52 mg/dL (ref >=46)
LDL CALC: 104 mg/dL (ref <130)
TRIGLYCERIDES: 193 mg/dL — AB (ref <150)
Total CHOL/HDL Ratio: 3.8 Ratio (ref <=5.0)
VLDL: 39 mg/dL — ABNORMAL HIGH (ref <30)

## 2015-03-23 MED ORDER — VALACYCLOVIR HCL 1 G PO TABS: 500.0000 mg | ORAL_TABLET | Freq: Every day | ORAL | Status: DC

## 2015-03-23 MED ORDER — ALPRAZOLAM 0.25 MG PO TABS: 0.2500 mg | ORAL_TABLET | Freq: Every evening | ORAL | Status: DC | PRN

## 2015-03-23 NOTE — Progress Notes (Signed)
Subjective:     Kimberly Clarke is a 59 y.o. female and is here for a comprehensive physical exam. The patient reports no problems. Her husband died of a massive heart attack a little more than 2 years ago.  She has started dating and has HSV. Has infrequent outbreaks, but is thinking of beginning a sexual relationship and is interested in suppression.  She continues to need Xanax 0.25 mg prn for her anxiety and needs a refill.  She is on Lexapro and has sought grief counseling.  She is tearful on exam today.  Social History   Social History  . Marital Status: Widowed    Spouse Name: N/A  . Number of Children: N/A  . Years of Education: N/A   Occupational History  . Not on file.   Social History Main Topics  . Smoking status: Never Smoker   . Smokeless tobacco: Not on file  . Alcohol Use: Not on file     Comment: occasion  . Drug Use: No     Comment: In early 66  . Sexual Activity:    Partners: Male    Birth Control/ Protection: Post-menopausal   Other Topics Concern  . Not on file   Social History Narrative   Health Maintenance  Topic Date Due  . Hepatitis C Screening  06/25/1955  . HIV Screening  02/28/1973  . TETANUS/TDAP  02/28/1977  . MAMMOGRAM  02/29/2008  . COLONOSCOPY  02/29/2008  . INFLUENZA VACCINE  12/21/2014  . PAP SMEAR  10/07/2016    The following portions of the patient's history were reviewed and updated as appropriate: allergies, current medications, past family history, past medical history, past social history, past surgical history and problem list.  Review of Systems Pertinent items noted in HPI and remainder of comprehensive ROS otherwise negative.   Objective:    BP 133/79 mmHg  Pulse 51  Ht 5\' 9"  (1.753 m)  Wt 167 lb (75.751 kg)  BMI 24.65 kg/m2 General appearance: alert, cooperative and appears stated age Head: Normocephalic, without obvious abnormality, atraumatic Neck: no adenopathy, supple, symmetrical, trachea midline and thyroid  not enlarged, symmetric, no tenderness/mass/nodules Lungs: clear to auscultation bilaterally Breasts: normal appearance, no masses or tenderness Heart: regular rate and rhythm, S1, S2 normal, no murmur, click, rub or gallop Abdomen: soft, non-tender; bowel sounds normal; no masses,  no organomegaly Pelvic: cervix normal in appearance, external genitalia normal, no adnexal masses or tenderness, no cervical motion tenderness, uterus normal size, shape, and consistency, vagina normal without discharge and vaginal atrophy Extremities: extremities normal, atraumatic, no cyanosis or edema Pulses: 2+ and symmetric Skin: Skin color, texture, turgor normal. No rashes or lesions Lymph nodes: Cervical, supraclavicular, and axillary nodes normal. Neurologic: Grossly normal    Assessment:    Healthy female exam.      Plan:   Problem List Items Addressed This Visit      Unprioritized   HSV-2 (herpes simplex virus 2) infection   Relevant Medications   valACYclovir (VALTREX) 1000 MG tablet   Insomnia   Relevant Medications   ALPRAZolam (XANAX) 0.25 MG tablet    Other Visit Diagnoses    Encounter for routine gynecological examination    -  Primary    Relevant Orders    Cytology - PAP    CBC    Comprehensive metabolic panel    Lipid panel    TSH    MM DIGITAL SCREENING BILATERAL    Ambulatory referral to Gastroenterology    HIV  antibody    Hepatitis C antibody    Screening for malignant neoplasm of cervix        Relevant Orders    Cytology - PAP      For colonoscopy Declines flu shot Has yearly mammogram at [facility]   See After Visit Summary for Counseling Recommendations

## 2015-03-23 NOTE — Patient Instructions (Signed)
Preventive Care for Adults, Female A healthy lifestyle and preventive care can promote health and wellness. Preventive health guidelines for women include the following key practices.  A routine yearly physical is a good way to check with your health care provider about your health and preventive screening. It is a chance to share any concerns and updates on your health and to receive a thorough exam.  Visit your dentist for a routine exam and preventive care every 6 months. Brush your teeth twice a day and floss once a day. Good oral hygiene prevents tooth decay and gum disease.  The frequency of eye exams is based on your age, health, family medical history, use of contact lenses, and other factors. Follow your health care provider's recommendations for frequency of eye exams.  Eat a healthy diet. Foods like vegetables, fruits, whole grains, low-fat dairy products, and lean protein foods contain the nutrients you need without too many calories. Decrease your intake of foods high in solid fats, added sugars, and salt. Eat the right amount of calories for you.Get information about a proper diet from your health care provider, if necessary.  Regular physical exercise is one of the most important things you can do for your health. Most adults should get at least 150 minutes of moderate-intensity exercise (any activity that increases your heart rate and causes you to sweat) each week. In addition, most adults need muscle-strengthening exercises on 2 or more days a week.  Maintain a healthy weight. The body mass index (BMI) is a screening tool to identify possible weight problems. It provides an estimate of body fat based on height and weight. Your health care provider can find your BMI and can help you achieve or maintain a healthy weight.For adults 20 years and older:  A BMI below 18.5 is considered underweight.  A BMI of 18.5 to 24.9 is normal.  A BMI of 25 to 29.9 is considered overweight.  A  BMI of 30 and above is considered obese.  Maintain normal blood lipids and cholesterol levels by exercising and minimizing your intake of saturated fat. Eat a balanced diet with plenty of fruit and vegetables. Blood tests for lipids and cholesterol should begin at age 45 and be repeated every 5 years. If your lipid or cholesterol levels are high, you are over 50, or you are at high risk for heart disease, you may need your cholesterol levels checked more frequently.Ongoing high lipid and cholesterol levels should be treated with medicines if diet and exercise are not working.  If you smoke, find out from your health care provider how to quit. If you do not use tobacco, do not start.  Lung cancer screening is recommended for adults aged 45-80 years who are at high risk for developing lung cancer because of a history of smoking. A yearly low-dose CT scan of the lungs is recommended for people who have at least a 30-pack-year history of smoking and are a current smoker or have quit within the past 15 years. A pack year of smoking is smoking an average of 1 pack of cigarettes a day for 1 year (for example: 1 pack a day for 30 years or 2 packs a day for 15 years). Yearly screening should continue until the smoker has stopped smoking for at least 15 years. Yearly screening should be stopped for people who develop a health problem that would prevent them from having lung cancer treatment.  If you are pregnant, do not drink alcohol. If you are  breastfeeding, be very cautious about drinking alcohol. If you are not pregnant and choose to drink alcohol, do not have more than 1 drink per day. One drink is considered to be 12 ounces (355 mL) of beer, 5 ounces (148 mL) of wine, or 1.5 ounces (44 mL) of liquor.  Avoid use of street drugs. Do not share needles with anyone. Ask for help if you need support or instructions about stopping the use of drugs.  High blood pressure causes heart disease and increases the risk  of stroke. Your blood pressure should be checked at least every 1 to 2 years. Ongoing high blood pressure should be treated with medicines if weight loss and exercise do not work.  If you are 55-79 years old, ask your health care provider if you should take aspirin to prevent strokes.  Diabetes screening is done by taking a blood sample to check your blood glucose level after you have not eaten for a certain period of time (fasting). If you are not overweight and you do not have risk factors for diabetes, you should be screened once every 3 years starting at age 45. If you are overweight or obese and you are 40-70 years of age, you should be screened for diabetes every year as part of your cardiovascular risk assessment.  Breast cancer screening is essential preventive care for women. You should practice "breast self-awareness." This means understanding the normal appearance and feel of your breasts and may include breast self-examination. Any changes detected, no matter how small, should be reported to a health care provider. Women in their 20s and 30s should have a clinical breast exam (CBE) by a health care provider as part of a regular health exam every 1 to 3 years. After age 40, women should have a CBE every year. Starting at age 40, women should consider having a mammogram (breast X-ray test) every year. Women who have a family history of breast cancer should talk to their health care provider about genetic screening. Women at a high risk of breast cancer should talk to their health care providers about having an MRI and a mammogram every year.  Breast cancer gene (BRCA)-related cancer risk assessment is recommended for women who have family members with BRCA-related cancers. BRCA-related cancers include breast, ovarian, tubal, and peritoneal cancers. Having family members with these cancers may be associated with an increased risk for harmful changes (mutations) in the breast cancer genes BRCA1 and  BRCA2. Results of the assessment will determine the need for genetic counseling and BRCA1 and BRCA2 testing.  Your health care provider may recommend that you be screened regularly for cancer of the pelvic organs (ovaries, uterus, and vagina). This screening involves a pelvic examination, including checking for microscopic changes to the surface of your cervix (Pap test). You may be encouraged to have this screening done every 3 years, beginning at age 21.  For women ages 30-65, health care providers may recommend pelvic exams and Pap testing every 3 years, or they may recommend the Pap and pelvic exam, combined with testing for human papilloma virus (HPV), every 5 years. Some types of HPV increase your risk of cervical cancer. Testing for HPV may also be done on women of any age with unclear Pap test results.  Other health care providers may not recommend any screening for nonpregnant women who are considered low risk for pelvic cancer and who do not have symptoms. Ask your health care provider if a screening pelvic exam is right for   you.  If you have had past treatment for cervical cancer or a condition that could lead to cancer, you need Pap tests and screening for cancer for at least 20 years after your treatment. If Pap tests have been discontinued, your risk factors (such as having a new sexual partner) need to be reassessed to determine if screening should resume. Some women have medical problems that increase the chance of getting cervical cancer. In these cases, your health care provider may recommend more frequent screening and Pap tests.  Colorectal cancer can be detected and often prevented. Most routine colorectal cancer screening begins at the age of 50 years and continues through age 75 years. However, your health care provider may recommend screening at an earlier age if you have risk factors for colon cancer. On a yearly basis, your health care provider may provide home test kits to check  for hidden blood in the stool. Use of a small camera at the end of a tube, to directly examine the colon (sigmoidoscopy or colonoscopy), can detect the earliest forms of colorectal cancer. Talk to your health care provider about this at age 50, when routine screening begins. Direct exam of the colon should be repeated every 5-10 years through age 75 years, unless early forms of precancerous polyps or small growths are found.  People who are at an increased risk for hepatitis B should be screened for this virus. You are considered at high risk for hepatitis B if:  You were born in a country where hepatitis B occurs often. Talk with your health care provider about which countries are considered high risk.  Your parents were born in a high-risk country and you have not received a shot to protect against hepatitis B (hepatitis B vaccine).  You have HIV or AIDS.  You use needles to inject street drugs.  You live with, or have sex with, someone who has hepatitis B.  You get hemodialysis treatment.  You take certain medicines for conditions like cancer, organ transplantation, and autoimmune conditions.  Hepatitis C blood testing is recommended for all people born from 1945 through 1965 and any individual with known risks for hepatitis C.  Practice safe sex. Use condoms and avoid high-risk sexual practices to reduce the spread of sexually transmitted infections (STIs). STIs include gonorrhea, chlamydia, syphilis, trichomonas, herpes, HPV, and human immunodeficiency virus (HIV). Herpes, HIV, and HPV are viral illnesses that have no cure. They can result in disability, cancer, and death.  You should be screened for sexually transmitted illnesses (STIs) including gonorrhea and chlamydia if:  You are sexually active and are younger than 24 years.  You are older than 24 years and your health care provider tells you that you are at risk for this type of infection.  Your sexual activity has changed  since you were last screened and you are at an increased risk for chlamydia or gonorrhea. Ask your health care provider if you are at risk.  If you are at risk of being infected with HIV, it is recommended that you take a prescription medicine daily to prevent HIV infection. This is called preexposure prophylaxis (PrEP). You are considered at risk if:  You are sexually active and do not regularly use condoms or know the HIV status of your partner(s).  You take drugs by injection.  You are sexually active with a partner who has HIV.  Talk with your health care provider about whether you are at high risk of being infected with HIV. If   you choose to begin PrEP, you should first be tested for HIV. You should then be tested every 3 months for as long as you are taking PrEP.  Osteoporosis is a disease in which the bones lose minerals and strength with aging. This can result in serious bone fractures or breaks. The risk of osteoporosis can be identified using a bone density scan. Women ages 67 years and over and women at risk for fractures or osteoporosis should discuss screening with their health care providers. Ask your health care provider whether you should take a calcium supplement or vitamin D to reduce the rate of osteoporosis.  Menopause can be associated with physical symptoms and risks. Hormone replacement therapy is available to decrease symptoms and risks. You should talk to your health care provider about whether hormone replacement therapy is right for you.  Use sunscreen. Apply sunscreen liberally and repeatedly throughout the day. You should seek shade when your shadow is shorter than you. Protect yourself by wearing long sleeves, pants, a wide-brimmed hat, and sunglasses year round, whenever you are outdoors.  Once a month, do a whole body skin exam, using a mirror to look at the skin on your back. Tell your health care provider of new moles, moles that have irregular borders, moles that  are larger than a pencil eraser, or moles that have changed in shape or color.  Stay current with required vaccines (immunizations).  Influenza vaccine. All adults should be immunized every year.  Tetanus, diphtheria, and acellular pertussis (Td, Tdap) vaccine. Pregnant women should receive 1 dose of Tdap vaccine during each pregnancy. The dose should be obtained regardless of the length of time since the last dose. Immunization is preferred during the 27th-36th week of gestation. An adult who has not previously received Tdap or who does not know her vaccine status should receive 1 dose of Tdap. This initial dose should be followed by tetanus and diphtheria toxoids (Td) booster doses every 10 years. Adults with an unknown or incomplete history of completing a 3-dose immunization series with Td-containing vaccines should begin or complete a primary immunization series including a Tdap dose. Adults should receive a Td booster every 10 years.  Varicella vaccine. An adult without evidence of immunity to varicella should receive 2 doses or a second dose if she has previously received 1 dose. Pregnant females who do not have evidence of immunity should receive the first dose after pregnancy. This first dose should be obtained before leaving the health care facility. The second dose should be obtained 4-8 weeks after the first dose.  Human papillomavirus (HPV) vaccine. Females aged 13-26 years who have not received the vaccine previously should obtain the 3-dose series. The vaccine is not recommended for use in pregnant females. However, pregnancy testing is not needed before receiving a dose. If a female is found to be pregnant after receiving a dose, no treatment is needed. In that case, the remaining doses should be delayed until after the pregnancy. Immunization is recommended for any person with an immunocompromised condition through the age of 61 years if she did not get any or all doses earlier. During the  3-dose series, the second dose should be obtained 4-8 weeks after the first dose. The third dose should be obtained 24 weeks after the first dose and 16 weeks after the second dose.  Zoster vaccine. One dose is recommended for adults aged 30 years or older unless certain conditions are present.  Measles, mumps, and rubella (MMR) vaccine. Adults born  before 1957 generally are considered immune to measles and mumps. Adults born in 1957 or later should have 1 or more doses of MMR vaccine unless there is a contraindication to the vaccine or there is laboratory evidence of immunity to each of the three diseases. A routine second dose of MMR vaccine should be obtained at least 28 days after the first dose for students attending postsecondary schools, health care workers, or international travelers. People who received inactivated measles vaccine or an unknown type of measles vaccine during 1963-1967 should receive 2 doses of MMR vaccine. People who received inactivated mumps vaccine or an unknown type of mumps vaccine before 1979 and are at high risk for mumps infection should consider immunization with 2 doses of MMR vaccine. For females of childbearing age, rubella immunity should be determined. If there is no evidence of immunity, females who are not pregnant should be vaccinated. If there is no evidence of immunity, females who are pregnant should delay immunization until after pregnancy. Unvaccinated health care workers born before 1957 who lack laboratory evidence of measles, mumps, or rubella immunity or laboratory confirmation of disease should consider measles and mumps immunization with 2 doses of MMR vaccine or rubella immunization with 1 dose of MMR vaccine.  Pneumococcal 13-valent conjugate (PCV13) vaccine. When indicated, a person who is uncertain of his immunization history and has no record of immunization should receive the PCV13 vaccine. All adults 65 years of age and older should receive this  vaccine. An adult aged 19 years or older who has certain medical conditions and has not been previously immunized should receive 1 dose of PCV13 vaccine. This PCV13 should be followed with a dose of pneumococcal polysaccharide (PPSV23) vaccine. Adults who are at high risk for pneumococcal disease should obtain the PPSV23 vaccine at least 8 weeks after the dose of PCV13 vaccine. Adults older than 57 years of age who have normal immune system function should obtain the PPSV23 vaccine dose at least 1 year after the dose of PCV13 vaccine.  Pneumococcal polysaccharide (PPSV23) vaccine. When PCV13 is also indicated, PCV13 should be obtained first. All adults aged 65 years and older should be immunized. An adult younger than age 65 years who has certain medical conditions should be immunized. Any person who resides in a nursing home or long-term care facility should be immunized. An adult smoker should be immunized. People with an immunocompromised condition and certain other conditions should receive both PCV13 and PPSV23 vaccines. People with human immunodeficiency virus (HIV) infection should be immunized as soon as possible after diagnosis. Immunization during chemotherapy or radiation therapy should be avoided. Routine use of PPSV23 vaccine is not recommended for American Indians, [place] Natives, or people younger than 65 years unless there are medical conditions that require PPSV23 vaccine. When indicated, people who have unknown immunization and have no record of immunization should receive PPSV23 vaccine. One-time revaccination 5 years after the first dose of PPSV23 is recommended for people aged 19-64 years who have chronic kidney failure, nephrotic syndrome, asplenia, or immunocompromised conditions. People who received 1-2 doses of PPSV23 before age 65 years should receive another dose of PPSV23 vaccine at age 65 years or later if at least 5 years have passed since the previous dose. Doses of PPSV23 are not  needed for people immunized with PPSV23 at or after age 65 years.  Meningococcal vaccine. Adults with asplenia or persistent complement component deficiencies should receive 2 doses of quadrivalent meningococcal conjugate (MenACWY-D) vaccine. The doses should be obtained   at least 2 months apart. Microbiologists working with certain meningococcal bacteria, [place] recruits, people at risk during an outbreak, and people who travel to or live in countries with a high rate of meningitis should be immunized. A first-year college student up through age 34 years who is living in a residence hall should receive a dose if she did not receive a dose on or after her 16th birthday. Adults who have certain high-risk conditions should receive one or more doses of vaccine.  Hepatitis A vaccine. Adults who wish to be protected from this disease, have certain high-risk conditions, work with hepatitis A-infected animals, work in hepatitis A research labs, or travel to or work in countries with a high rate of hepatitis A should be immunized. Adults who were previously unvaccinated and who anticipate close contact with an international adoptee during the first 60 days after arrival in the [place] from a country with a high rate of hepatitis A should be immunized.  Hepatitis B vaccine. Adults who wish to be protected from this disease, have certain high-risk conditions, may be exposed to blood or other infectious body fluids, are household contacts or sex partners of hepatitis B positive people, are clients or workers in certain care facilities, or travel to or work in countries with a high rate of hepatitis B should be immunized.  Haemophilus influenzae type b (Hib) vaccine. A previously unvaccinated person with asplenia or sickle cell disease or having a scheduled splenectomy should receive 1 dose of Hib vaccine. Regardless of previous immunization, a recipient of a hematopoietic stem cell transplant should receive a  3-dose series 6-12 months after her successful transplant. Hib vaccine is not recommended for adults with HIV infection. Preventive Services / Frequency Ages 35 to 4 years  Blood pressure check.** / Every 3-5 years.  Lipid and cholesterol check.** / Every 5 years beginning at age 60.  Clinical breast exam.** / Every 3 years for women in their 71s and 10s.  BRCA-related cancer risk assessment.** / For women who have family members with a BRCA-related cancer (breast, ovarian, tubal, or peritoneal cancers).  Pap test.** / Every 2 years from ages 76 through 26. Every 3 years starting at age 61 through age 76 or 93 with a history of 3 consecutive normal Pap tests.  HPV screening.** / Every 3 years from ages 37 through ages 60 to 51 with a history of 3 consecutive normal Pap tests.  Hepatitis C blood test.** / For any individual with known risks for hepatitis C.  Skin self-exam. / Monthly.  Influenza vaccine. / Every year.  Tetanus, diphtheria, and acellular pertussis (Tdap, Td) vaccine.** / Consult your health care provider. Pregnant women should receive 1 dose of Tdap vaccine during each pregnancy. 1 dose of Td every 10 years.  Varicella vaccine.** / Consult your health care provider. Pregnant females who do not have evidence of immunity should receive the first dose after pregnancy.  HPV vaccine. / 3 doses over 6 months, if 93 and younger. The vaccine is not recommended for use in pregnant females. However, pregnancy testing is not needed before receiving a dose.  Measles, mumps, rubella (MMR) vaccine.** / You need at least 1 dose of MMR if you were born in 1957 or later. You may also need a 2nd dose. For females of childbearing age, rubella immunity should be determined. If there is no evidence of immunity, females who are not pregnant should be vaccinated. If there is no evidence of immunity, females who are  pregnant should delay immunization until after pregnancy.  Pneumococcal  13-valent conjugate (PCV13) vaccine.** / Consult your health care provider.  Pneumococcal polysaccharide (PPSV23) vaccine.** / 1 to 2 doses if you smoke cigarettes or if you have certain conditions.  Meningococcal vaccine.** / 1 dose if you are age 68 to 8 years and a Market researcher living in a residence hall, or have one of several medical conditions, you need to get vaccinated against meningococcal disease. You may also need additional booster doses.  Hepatitis A vaccine.** / Consult your health care provider.  Hepatitis B vaccine.** / Consult your health care provider.  Haemophilus influenzae type b (Hib) vaccine.** / Consult your health care provider. Ages 7 to 53 years  Blood pressure check.** / Every year.  Lipid and cholesterol check.** / Every 5 years beginning at age 25 years.  Lung cancer screening. / Every year if you are aged 11-80 years and have a 30-pack-year history of smoking and currently smoke or have quit within the past 15 years. Yearly screening is stopped once you have quit smoking for at least 15 years or develop a health problem that would prevent you from having lung cancer treatment.  Clinical breast exam.** / Every year after age 48 years.  BRCA-related cancer risk assessment.** / For women who have family members with a BRCA-related cancer (breast, ovarian, tubal, or peritoneal cancers).  Mammogram.** / Every year beginning at age 41 years and continuing for as long as you are in good health. Consult with your health care provider.  Pap test.** / Every 3 years starting at age 65 years through age 37 or 70 years with a history of 3 consecutive normal Pap tests.  HPV screening.** / Every 3 years from ages 72 years through ages 60 to 40 years with a history of 3 consecutive normal Pap tests.  Fecal occult blood test (FOBT) of stool. / Every year beginning at age 21 years and continuing until age 5 years. You may not need to do this test if you get  a colonoscopy every 10 years.  Flexible sigmoidoscopy or colonoscopy.** / Every 5 years for a flexible sigmoidoscopy or every 10 years for a colonoscopy beginning at age 35 years and continuing until age 48 years.  Hepatitis C blood test.** / For all people born from [birth year] through 1965 and any individual with known risks for hepatitis C.  Skin self-exam. / Monthly.  Influenza vaccine. / Every year.  Tetanus, diphtheria, and acellular pertussis (Tdap/Td) vaccine.** / Consult your health care provider. Pregnant women should receive 1 dose of Tdap vaccine during each pregnancy. 1 dose of Td every 10 years.  Varicella vaccine.** / Consult your health care provider. Pregnant females who do not have evidence of immunity should receive the first dose after pregnancy.  Zoster vaccine.** / 1 dose for adults aged 30 years or older.  Measles, mumps, rubella (MMR) vaccine.** / You need at least 1 dose of MMR if you were born in 1957 or later. You may also need a second dose. For females of childbearing age, rubella immunity should be determined. If there is no evidence of immunity, females who are not pregnant should be vaccinated. If there is no evidence of immunity, females who are pregnant should delay immunization until after pregnancy.  Pneumococcal 13-valent conjugate (PCV13) vaccine.** / Consult your health care provider.  Pneumococcal polysaccharide (PPSV23) vaccine.** / 1 to 2 doses if you smoke cigarettes or if you have certain conditions.  Meningococcal vaccine.** /  Consult your health care provider.  Hepatitis A vaccine.** / Consult your health care provider.  Hepatitis B vaccine.** / Consult your health care provider.  Haemophilus influenzae type b (Hib) vaccine.** / Consult your health care provider. Ages 64 years and over  Blood pressure check.** / Every year.  Lipid and cholesterol check.** / Every 5 years beginning at age 23 years.  Lung cancer screening. / Every year if you  are aged 16-80 years and have a 30-pack-year history of smoking and currently smoke or have quit within the past 15 years. Yearly screening is stopped once you have quit smoking for at least 15 years or develop a health problem that would prevent you from having lung cancer treatment.  Clinical breast exam.** / Every year after age 74 years.  BRCA-related cancer risk assessment.** / For women who have family members with a BRCA-related cancer (breast, ovarian, tubal, or peritoneal cancers).  Mammogram.** / Every year beginning at age 44 years and continuing for as long as you are in good health. Consult with your health care provider.  Pap test.** / Every 3 years starting at age 58 years through age 22 or 39 years with 3 consecutive normal Pap tests. Testing can be stopped between 65 and 70 years with 3 consecutive normal Pap tests and no abnormal Pap or HPV tests in the past 10 years.  HPV screening.** / Every 3 years from ages 64 years through ages 70 or 61 years with a history of 3 consecutive normal Pap tests. Testing can be stopped between 65 and 70 years with 3 consecutive normal Pap tests and no abnormal Pap or HPV tests in the past 10 years.  Fecal occult blood test (FOBT) of stool. / Every year beginning at age 40 years and continuing until age 27 years. You may not need to do this test if you get a colonoscopy every 10 years.  Flexible sigmoidoscopy or colonoscopy.** / Every 5 years for a flexible sigmoidoscopy or every 10 years for a colonoscopy beginning at age 7 years and continuing until age 32 years.  Hepatitis C blood test.** / For all people born from [birth year] through 1963 and any individual with known risks for hepatitis C.  Osteoporosis screening.** / A one-time screening for women ages 30 years and over and women at risk for fractures or osteoporosis.  Skin self-exam. / Monthly.  Influenza vaccine. / Every year.  Tetanus, diphtheria, and acellular pertussis (Tdap/Td)  vaccine.** / 1 dose of Td every 10 years.  Varicella vaccine.** / Consult your health care provider.  Zoster vaccine.** / 1 dose for adults aged 35 years or older.  Pneumococcal 13-valent conjugate (PCV13) vaccine.** / Consult your health care provider.  Pneumococcal polysaccharide (PPSV23) vaccine.** / 1 dose for all adults aged 46 years and older.  Meningococcal vaccine.** / Consult your health care provider.  Hepatitis A vaccine.** / Consult your health care provider.  Hepatitis B vaccine.** / Consult your health care provider.  Haemophilus influenzae type b (Hib) vaccine.** / Consult your health care provider. ** Family history and personal history of risk and conditions may change your health care provider's recommendations.   This information is not intended to replace advice given to you by your health care provider. Make sure you discuss any questions you have with your health care provider.   Document Released: 07/04/2001 Document Revised: 05/29/2014 Document Reviewed: 10/03/2010 Elsevier Interactive Patient Education [employer].

## 2015-03-24 LAB — TSH: TSH: 1.201 u[IU]/mL (ref 0.350–4.500)

## 2015-03-24 LAB — HEPATITIS C ANTIBODY: HCV AB: NEGATIVE

## 2015-03-24 LAB — HIV ANTIBODY (ROUTINE TESTING W REFLEX): HIV 1&2 Ab, 4th Generation: NONREACTIVE

## 2015-03-26 ENCOUNTER — Telehealth: Payer: Self-pay | Admitting: *Deleted

## 2015-03-26 ENCOUNTER — Telehealth: Payer: Self-pay

## 2015-03-26 NOTE — Telephone Encounter (Signed)
-----   Message from [name], MD sent at 03/25/2015  4:55 PM EDT ----- Labs are good. Please inform pt

## 2015-03-26 NOTE — Telephone Encounter (Signed)
Called pt, left message that labs were WDL.

## 2015-03-26 NOTE — Telephone Encounter (Signed)
Called to try and follow up with scheduling an appt for a referral we put in for a colonoscopy. No answer, left a voice message.

## 2015-03-29 ENCOUNTER — Encounter: Payer: Self-pay | Admitting: *Deleted

## 2015-03-29 ENCOUNTER — Telehealth: Payer: Self-pay

## 2015-03-29 LAB — CYTOLOGY - PAP

## 2015-03-29 NOTE — Telephone Encounter (Signed)
Called patient to try and coordinate a referral order that was placed on 03/23/2015. Patient declined at this time, states she will call back with the name of a facility in [place] that she wants to go to.

## 2015-04-01 ENCOUNTER — Ambulatory Visit
Admission: RE | Admit: 2015-04-01 | Discharge: 2015-04-01 | Disposition: A | Discharge status: Home / Self Care | Payer: Commercial Managed Care - HMO | Source: Ambulatory Visit | Attending: Family Medicine | Admitting: Family Medicine

## 2015-04-01 DIAGNOSIS — Z1231 Encounter for screening mammogram for malignant neoplasm of breast: Secondary | ICD-10-CM | POA: Diagnosis present

## 2015-04-01 DIAGNOSIS — Z01419 Encounter for gynecological examination (general) (routine) without abnormal findings: Secondary | ICD-10-CM

## 2015-11-22 ENCOUNTER — Telehealth: Payer: Self-pay | Admitting: *Deleted

## 2015-11-22 DIAGNOSIS — B009 Herpesviral infection, unspecified: Secondary | ICD-10-CM

## 2015-11-22 MED ORDER — VALACYCLOVIR HCL 1 G PO TABS: 500.0000 mg | ORAL_TABLET | Freq: Every day | ORAL | Status: DC

## 2015-11-22 NOTE — Telephone Encounter (Signed)
Received fax from [facility] requesting refill on Valtrex 500mg  daily, sent rx to pharmacy per Dr Kennon Rounds order.

## 2015-12-25 ENCOUNTER — Other Ambulatory Visit: Payer: Self-pay | Admitting: Family Medicine

## 2015-12-25 DIAGNOSIS — G4700 Insomnia, unspecified: Secondary | ICD-10-CM

## 2015-12-27 NOTE — Telephone Encounter (Signed)
Phone rx to noted pharmacy.

## 2016-04-18 ENCOUNTER — Encounter: Payer: Self-pay | Admitting: Family Medicine

## 2016-04-18 ENCOUNTER — Ambulatory Visit (INDEPENDENT_AMBULATORY_CARE_PROVIDER_SITE_OTHER): Payer: Commercial Managed Care - HMO | Admitting: Family Medicine

## 2016-04-18 VITALS — BP 126/84 | HR 76 | Ht 68.5 in | Wt 159.0 lb

## 2016-04-18 DIAGNOSIS — N6310 Unspecified lump in the right breast, unspecified quadrant: Secondary | ICD-10-CM

## 2016-04-18 DIAGNOSIS — G4700 Insomnia, unspecified: Secondary | ICD-10-CM | POA: Diagnosis not present

## 2016-04-18 MED ORDER — ALPRAZOLAM 0.25 MG PO TABS: 0.2500 mg | ORAL_TABLET | Freq: Every day | ORAL | 0 refills | Status: DC

## 2016-04-18 NOTE — Progress Notes (Signed)
CLINIC ENCOUNTER NOTE  History:  60 y.o. G0P0000 here today for right breast mass, noted on her self exam 2 weekends ago. No pain/ttp. Reports it "feels large" and she is worried because her mother had BC. She denies nipple retraction, discharge, changes to skin. She denies any abnormal vaginal discharge, bleeding, pelvic pain or other concerns.   Past Medical History:  Diagnosis Date  . Anxiety   . HSV infection    HISTORY OF HSV  . Postmenopause bleeding     Past Surgical History:  Procedure Laterality Date  . ANAL FISTULECTOMY    . ENDOMETRIAL BIOPSY    . KNEE SURGERY     snow skating    The following portions of the patient's history were reviewed and updated as appropriate: allergies, current medications, past family history, past medical history, past social history, past surgical history and problem list.   Health Maintenance:  Normal pap and negative HRHPV on 03/23/2015.  Normal mammogram on 04/01/2015 that was Birads 1   Review of Systems:  Pertinent items noted in HPI and remainder of comprehensive ROS otherwise negative.   Objective:  Physical Exam BP 126/84 (BP Location: Left Arm, Cuff Size: Small)   Pulse 76   Ht 5' 8.5" (1.74 m)   Wt 159 lb (72.1 kg)   BMI 23.82 kg/m  CONSTITUTIONAL: Well-developed, well-nourished female in no acute distress.  HENT:  Normocephalic, atraumatic. External right and left ear normal. Oropharynx is clear and moist EYES: Conjunctivae and EOM are normal. Pupils are equal, round, and reactive to light. No scleral icterus.  NECK: Normal range of motion, supple, no masses SKIN: Skin is warm and dry. No rash noted. Not diaphoretic. No erythema. No pallor. [place]: Alert and oriented to person, place, and time. Normal reflexes, muscle tone coordination. No cranial nerve deficit noted. PSYCHIATRIC: Normal mood and affect. Normal behavior. Normal judgment and thought content. CARDIOVASCULAR: Normal heart rate noted RESPIRATORY: Effort  and breath sounds normal, no problems with respiration noted ABDOMEN: Soft, no distention noted.   PELVIC: Deferred MUSCULOSKELETAL: Normal range of motion. No edema noted.  Physical Exam  Pulmonary/Chest: Right breast exhibits mass. Right breast exhibits no inverted nipple, no nipple discharge, no skin change and no tenderness. Left breast exhibits no inverted nipple, no mass, no nipple discharge, no skin change and no tenderness.       Labs and Imaging  Last mammogram in 03/2015 - Birads 1, negative  Assessment & Plan:  1. Insomnia, unspecified type - patient with multiple stressors including sudden death of husband 3 yrs ago, exaggerated by holiday season.  - ALPRAZolam (XANAX) 0.25 MG tablet; Take 1 tablet (0.25 mg total) by mouth at bedtime.  Dispense: 30 tablet; Refill: 0  2. Breast mass, right - Firm non-mobile mass felt at 9 o'clock in high risk patient (mother with BC and >50). - Addressed concerns of patient given her family history and reviewed that breast cancer on the differential and we must do the appropriate evaluation.  - MM Digital Diagnostic Bilat; Future - US Breast bilateral   Routine preventative health maintenance measures emphasized. Please refer to After Visit Summary for other counseling recommendations.   Return in about 4 weeks (around 05/16/2016) for after mammogram.  Future Appointments Date Time Provider [place]  04/21/2016 10:20 AM [facility]  04/21/2016 10:40 AM [facility] [place] [facility]  04/21/2016 10:50 AM [facility] [place] [facility]  05/23/2016 2:45 PM [name], MD [facility]  Total face-to-face time with patient: 30 minutes. Over 50% of encounter was spent on counseling and coordination of care.

## 2016-04-18 NOTE — Progress Notes (Signed)
Patient complaining of right side breast lump that she noticed one week ago. Kathrene Alu RNBSN

## 2016-04-21 ENCOUNTER — Other Ambulatory Visit: Payer: Self-pay | Admitting: Family Medicine

## 2016-04-21 ENCOUNTER — Other Ambulatory Visit (HOSPITAL_COMMUNITY): Payer: Self-pay | Admitting: Family Medicine

## 2016-04-21 ENCOUNTER — Ambulatory Visit
Admission: RE | Admit: 2016-04-21 | Discharge: 2016-04-21 | Disposition: A | Discharge status: Home / Self Care | Payer: 59 | Source: Ambulatory Visit | Attending: Obstetrics & Gynecology | Admitting: Obstetrics & Gynecology

## 2016-04-21 ENCOUNTER — Ambulatory Visit
Admission: RE | Admit: 2016-04-21 | Discharge: 2016-04-21 | Disposition: A | Discharge status: Home / Self Care | Payer: 59 | Source: Ambulatory Visit | Attending: Family Medicine | Admitting: Family Medicine

## 2016-04-21 ENCOUNTER — Other Ambulatory Visit (HOSPITAL_COMMUNITY): Payer: Self-pay | Admitting: Obstetrics & Gynecology

## 2016-04-21 DIAGNOSIS — N6310 Unspecified lump in the right breast, unspecified quadrant: Secondary | ICD-10-CM

## 2016-04-21 DIAGNOSIS — D0511 Intraductal carcinoma in situ of right breast: Secondary | ICD-10-CM | POA: Insufficient documentation

## 2016-04-21 DIAGNOSIS — C773 Secondary and unspecified malignant neoplasm of axilla and upper limb lymph nodes: Secondary | ICD-10-CM | POA: Insufficient documentation

## 2016-04-21 DIAGNOSIS — R928 Other abnormal and inconclusive findings on diagnostic imaging of breast: Secondary | ICD-10-CM

## 2016-04-24 DIAGNOSIS — C801 Malignant (primary) neoplasm, unspecified: Secondary | ICD-10-CM

## 2016-04-24 HISTORY — DX: Malignant (primary) neoplasm, unspecified: C80.1

## 2016-04-26 ENCOUNTER — Other Ambulatory Visit: Payer: Self-pay | Admitting: Family Medicine

## 2016-04-26 DIAGNOSIS — C50419 Malignant neoplasm of upper-outer quadrant of unspecified female breast: Secondary | ICD-10-CM | POA: Insufficient documentation

## 2016-04-26 DIAGNOSIS — N63 Unspecified lump in unspecified breast: Secondary | ICD-10-CM | POA: Insufficient documentation

## 2016-04-26 NOTE — Progress Notes (Signed)
Contacted by Koren Shiver- Navigator regarding referrals to [facility] and Medical oncology. Placed referrals

## 2016-04-26 NOTE — Progress Notes (Signed)
Yes the referrals are in!  Thank you so much.

## 2016-04-26 NOTE — Progress Notes (Deleted)
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  INTERVAL HISTORY: ***  REVIEW OF SYSTEMS:   ROS  As per HPI. Otherwise, a complete review of systems is negative.  PAST MEDICAL HISTORY: Past Medical History:  Diagnosis Date  . Anxiety   . HSV infection    HISTORY OF HSV  . Postmenopause bleeding     PAST SURGICAL HISTORY: Past Surgical History:  Procedure Laterality Date  . ANAL FISTULECTOMY    . ENDOMETRIAL BIOPSY    . KNEE SURGERY     snow skating    FAMILY HISTORY: Family History  Problem Relation Age of Onset  . Cancer Mother     BREAST AND BONES  . Breast cancer Mother 27  . Emphysema Father   . Cancer Father   . Cancer Maternal Aunt     ADVANCED DIRECTIVES (Y/N):  N  HEALTH MAINTENANCE: Social History  Substance Use Topics  . Smoking status: Never Smoker  . Smokeless tobacco: Not on file  . Alcohol use Not on file     Comment: occasion     Colonoscopy:  PAP:  Bone density:  Lipid panel:  Allergies  Allergen Reactions  . Penicillins     Current Outpatient Prescriptions  Medication Sig Dispense Refill  . ALPRAZolam (XANAX) 0.25 MG tablet Take 1 tablet (0.25 mg total) by mouth at bedtime. 30 tablet 0  . Calcium Polycarbophil (FIBERCON PO) Take by mouth.    . citalopram (CELEXA) 20 MG tablet     . Docusate Sodium (COLACE PO) Take by mouth.    . escitalopram (LEXAPRO) 5 MG tablet   0  . Multiple Vitamin (MULTIVITAMIN) capsule Take 1 capsule by mouth daily.    . valACYclovir (VALTREX) 1000 MG tablet Take 0.5 tablets (500 mg total) by mouth daily. 30 tablet 6   No current facility-administered medications for this visit.      OBJECTIVE: There were no vitals filed for this visit.   There is no height or weight on file to calculate BMI.    ECOG FS:{CHL ONC [id number]  General: Well-developed, well-nourished, no acute distress. Eyes: Pink conjunctiva, anicteric sclera. HEENT: Normocephalic, moist mucous membranes, clear oropharnyx. Lungs: Clear to auscultation bilaterally. Heart: Regular rate and rhythm. No rubs, murmurs, or gallops. Abdomen: Soft, nontender, nondistended. No organomegaly noted, normoactive bowel sounds. Musculoskeletal: No edema, cyanosis, or clubbing. Neuro: Alert, answering all questions appropriately. Cranial nerves grossly intact. Skin: No rashes or petechiae noted. Psych: Normal affect. Lymphatics: No cervical, calvicular, axillary or inguinal LAD.   LAB RESULTS:  Lab Results  Component Value Date   NA 140 03/23/2015   K 4.4 03/23/2015   CL 103 03/23/2015   CO2 29 03/23/2015   GLUCOSE 89 03/23/2015   BUN 15 03/23/2015   CREATININE 0.91 03/23/2015   CALCIUM 9.5 03/23/2015   PROT 7.4 03/23/2015   ALBUMIN 4.5 03/23/2015   AST 25 03/23/2015   ALT 25 03/23/2015   ALKPHOS 117 03/23/2015   BILITOT 0.4 03/23/2015    Lab Results  Component Value Date   WBC 8.7 03/23/2015   HGB 14.3 03/23/2015   HCT 41.8 03/23/2015   MCV 87.3 03/23/2015   PLT 335 03/23/2015     STUDIES:  US Breast Ltd Uni Right Inc Axilla  Result Date: 04/21/2016 CLINICAL DATA:  60 year old female with new palpable lump in the upper-outer right breast discovered on self-examination. EXAM: 2D DIGITAL DIAGNOSTIC BILATERAL MAMMOGRAM WITH CAD AND ADJUNCT TOMO ULTRASOUND RIGHT BREAST COMPARISON:  Previous exam(s). ACR Breast Density Category b: There are scattered areas of fibroglandular density. FINDINGS: A new spiculated mass containing suspicious calcifications within the upper-outer right breast is identified. No other worrisome mass, distortion or suspicious calcifications are noted bilaterally.  Mammographic images were processed with CAD. On physical exam, a firm palpable mass identified at the 10 o'clock position of the right breast 4 cm from the nipple. Targeted ultrasound is performed, showing a 1.3 x 1.7 x 1.7 cm irregular hypoechoic mass at the 10 o'clock position of the right breast 4 cm from the nipple and containing calcifications. A right axillary lymph node with focal cortical thickening measuring up to 4.5 mm is identified. IMPRESSION: Highly suspicious 1.7 cm mass in the upper-outer right breast with borderline right axillary lymph node. Tissue sampling is recommended. RECOMMENDATION: Ultrasound-guided right breast and right axillary biopsies, which will be arranged. I have discussed the findings and recommendations with the patient. Results were also provided in writing at the conclusion of the visit. If applicable, a reminder letter will be sent to the patient regarding the next appointment. BI-RADS CATEGORY  5: Highly suggestive of malignancy. Electronically Signed   By: Margarette Canada M.D.   On: 04/21/2016 11:27   Mm Diag Breast Tomo Bilateral  Result Date: 04/21/2016 CLINICAL DATA:  60 year old female with new palpable lump in the upper-outer right breast discovered on self-examination. EXAM: 2D DIGITAL DIAGNOSTIC BILATERAL MAMMOGRAM WITH CAD AND ADJUNCT TOMO ULTRASOUND RIGHT BREAST COMPARISON:  Previous exam(s). ACR Breast Density Category b: There are scattered areas of fibroglandular density. FINDINGS: A new spiculated mass containing suspicious calcifications within the upper-outer right breast is identified. No other worrisome mass, distortion or suspicious calcifications are noted bilaterally. Mammographic images were processed with CAD. On physical exam, a firm palpable mass identified at the 10 o'clock position of the right breast 4 cm from the nipple. Targeted ultrasound is performed, showing a 1.3 x 1.7 x 1.7 cm irregular hypoechoic mass at the 10 o'clock position of the right  breast 4 cm from the nipple and containing calcifications. A right axillary lymph node with focal cortical thickening measuring up to 4.5 mm is identified. IMPRESSION: Highly suspicious 1.7 cm mass in the upper-outer right breast with borderline right axillary lymph node. Tissue sampling is recommended. RECOMMENDATION: Ultrasound-guided right breast and right axillary biopsies, which will be arranged. I have discussed the findings and recommendations with the patient. Results were also provided in writing at the conclusion of the visit. If applicable, a reminder letter will be sent to the patient regarding the next appointment. BI-RADS CATEGORY  5: Highly suggestive of malignancy. Electronically Signed   By: Margarette Canada M.D.   On: 04/21/2016 11:27   Mm Clip Placement Right  Result Date: 04/21/2016 CLINICAL DATA:  Evaluate clip placement following ultrasound-guided right breast biopsies. EXAM: DIAGNOSTIC RIGHT MAMMOGRAM POST ULTRASOUND BIOPSY COMPARISON:  Previous exam(s). FINDINGS: Mammographic images were obtained following ultrasound guided biopsy of mass in the upper-outer right breast and right axillary lymph node. The coil shaped clip is in satisfactory position corresponding to the biopsied mass in the upper-outer right breast. The biopsy clip corresponding to the right axillary lymph node biopsy is not visualized mammographically due to location. IMPRESSION: Satisfactory clip placement following  ultrasound-guided right breast biopsy. Final Assessment: Post Procedure Mammograms for Marker Placement Electronically Signed   By: Margarette Canada M.D.   On: 04/21/2016 13:02   [place] Rt Breast Bx W Loc Dev 1st Lesion Img Bx Spec US Guide  Addendum Date: 04/26/2016   ADDENDUM REPORT: 04/26/2016 12:01 ADDENDUM: Pathology of the right breast biopsy revealed INVASIVE MAMMARY CARCINOMA, GRADE 2 AND DUCTAL CARCINOMA IN SITU, GRADE 2. This was found to be concordant by Dr. Brigitte Pulse. Recommendation:  Surgical and oncology  referrals. At the patient's request, results and recommendations were relayed to the patient's sister, Lolita Patella by phone by Dr. Brigitte Pulse. Ms. Melany Guernsey stated her sister has done well following the biopsy with no bleeding or hematoma. All of her questions were answered. She requested that the referral be made to see Dr. Tamala Julian. She will be contacted by the nurse navigators from [facility] with appointment information. She was encouraged to call the [place] [facility] with any further questions or concerns. Appointments were made with Dr. Tamala Julian on 04/27/16 at 9:00 AM and with Dr. Grayland Ormond, oncologist on 04/27/16 at 11:00 AM by Al Pimple, RN, nurse navigator. The information was relayed to the patient's sister, Lolita Patella. Addendum by Jetta Lout, RRA on 04/26/16. Electronically Signed   By: Margarette Canada M.D.   On: 04/26/2016 12:01   Result Date: 04/26/2016 CLINICAL DATA:  60 year old female with highly suspicious mass in the upper-outer right breast and mildly enlarged right axillary lymph node. For tissue sampling of suspicious mass in the upper-outer right breast. EXAM: ULTRASOUND GUIDED RIGHT BREAST CORE NEEDLE BIOPSY COMPARISON:  Previous exam(s). FINDINGS: I met with the patient and we discussed the procedure of ultrasound-guided biopsy, including benefits and alternatives. We discussed the high likelihood of a successful procedure. We discussed the risks of the procedure, including infection, bleeding, tissue injury, clip migration, and inadequate sampling. Informed written consent was given. The usual time-out protocol was performed immediately prior to the procedure. Using sterile technique and 1% Lidocaine as local anesthetic, under direct ultrasound visualization, a 14 gauge spring-loaded device was used to perform biopsy of the 1.7 cm mass at the 10 o'clock position of the right breast 4 cm from the nipple using a  lateral approach. At the conclusion of the procedure a coil shaped tissue marker clip was deployed into the biopsy cavity. Follow up 2 view mammogram was performed and dictated separately. IMPRESSION: Ultrasound guided biopsy of suspicious mass in the upper-outer right breast. No apparent complications. Pathology will be followed. Electronically Signed: By: Margarette Canada M.D. On: 04/21/2016 13:03   [place] Rt Breast Bx W Loc Dev Ea Add Lesion Img Bx Spec US Guide  Addendum Date: 04/26/2016   ADDENDUM REPORT: 04/26/2016 12:00 ADDENDUM: Pathology of the right axillary lymph node biopsy revealed LYMPH NODE WITH METASTATIC CANCER. This was found to be concordant by Dr. Brigitte Pulse. Recommendation:  Surgical and oncology referrals. At the patient's request, results and recommendations were relayed to the patient's sister, Lolita Patella by phone by Dr. Brigitte Pulse on 04/24/16. Ms. Melany Guernsey stated her sister has done well following the biopsy. All of her questions were answered. She requested that the referral be made to Dr. Tamala Julian. She was encouraged to call the [place] of [facility] with any further questions or concerns. Referrals were made with Dr. Tamala Julian on 04/27/16 at 9:00 AM and with Dr. Grayland Ormond, oncologist on 2/7/7 at 11:00 AM by Al Pimple, RN, nurse  navigator for [facility]. Appointment information was relayed to the patient's sister, Lolita Patella. Addendum by Jetta Lout, RRA on 04/26/16. Electronically Signed   By: Margarette Canada M.D.   On: 04/26/2016 12:00   Result Date: 04/26/2016 CLINICAL DATA:  60 year old female with highly suspicious mass in the upper-outer right breast and mildly enlarged right axillary lymph node. For tissue sampling of mildly enlarged right axillary lymph node. EXAM: ULTRASOUND GUIDED CORE NEEDLE BIOPSY OF A RIGHT AXILLARY NODE COMPARISON:  Previous exam(s). FINDINGS: I met with the patient and we discussed the procedure  of ultrasound-guided biopsy, including benefits and alternatives. We discussed the high likelihood of a successful procedure. We discussed the risks of the procedure, including infection, bleeding, tissue injury, clip migration, and inadequate sampling. Informed written consent was given. The usual time-out protocol was performed immediately prior to the procedure. Using sterile technique and 1% Lidocaine as local anesthetic, under direct ultrasound visualization, a 14 gauge spring-loaded device was used to perform biopsy of the mildly enlarged right axillary lymph node with 4.5 mm cortical thickening using a inferior approach. At the conclusion of the procedure a tissue marker clip was deployed into the biopsy cavity with placement confirmed sonographically. IMPRESSION: Ultrasound guided biopsy of mildly enlarged right axillary lymph node. No apparent complications. Pathology will be followed. Electronically Signed: By: Margarette Canada M.D. 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  PLAN:    1. 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 She also understands that She can call clinic at any time with any questions, concerns, or complaints.   Primary cancer of upper outer quadrant of right female breast [facility])   Staging form: Breast, AJCC 7th Edition   - Clinical stage from 04/26/2016: Stage IA (T1c, N0, M0) - Signed by Lloyd Huger, MD on 04/26/2016  Lloyd Huger, MD   04/26/2016 9:27 PM

## 2016-04-27 ENCOUNTER — Encounter: Payer: Self-pay | Admitting: Oncology

## 2016-04-27 ENCOUNTER — Inpatient Hospital Stay: Payer: Commercial Managed Care - HMO | Attending: Oncology | Admitting: Oncology

## 2016-04-27 ENCOUNTER — Encounter (INDEPENDENT_AMBULATORY_CARE_PROVIDER_SITE_OTHER): Payer: Self-pay

## 2016-04-27 ENCOUNTER — Inpatient Hospital Stay (HOSPITAL_BASED_OUTPATIENT_CLINIC_OR_DEPARTMENT_OTHER): Payer: Commercial Managed Care - HMO | Admitting: Oncology

## 2016-04-27 VITALS — BP 137/83 | HR 89 | Temp 98.4°F | Resp 18 | Wt 160.1 lb

## 2016-04-27 DIAGNOSIS — Z808 Family history of malignant neoplasm of other organs or systems: Secondary | ICD-10-CM | POA: Diagnosis not present

## 2016-04-27 DIAGNOSIS — C50411 Malignant neoplasm of upper-outer quadrant of right female breast: Secondary | ICD-10-CM | POA: Diagnosis not present

## 2016-04-27 DIAGNOSIS — F419 Anxiety disorder, unspecified: Secondary | ICD-10-CM | POA: Diagnosis not present

## 2016-04-27 DIAGNOSIS — Z803 Family history of malignant neoplasm of breast: Secondary | ICD-10-CM | POA: Insufficient documentation

## 2016-04-27 DIAGNOSIS — R590 Localized enlarged lymph nodes: Secondary | ICD-10-CM | POA: Diagnosis not present

## 2016-04-27 DIAGNOSIS — Z79899 Other long term (current) drug therapy: Secondary | ICD-10-CM | POA: Diagnosis not present

## 2016-04-27 DIAGNOSIS — Z170 Estrogen receptor positive status [ER+]: Secondary | ICD-10-CM | POA: Insufficient documentation

## 2016-04-27 NOTE — Progress Notes (Signed)
New evaluation for breast cancer. Offers no complaints. 

## 2016-04-27 NOTE — Progress Notes (Signed)
  Oncology Nurse Navigator Documentation  Navigator Location: [facility]-Med Onc (04/25/16 1647)   )Navigator Encounter Type: Introductory phone call;Education;Telephone (04/25/16 1647) Telephone: Incoming Call;Outgoing Call;Appt Confirmation/Clarification;Education (04/25/16 1647) Abnormal Finding Date: 04/21/16 (04/25/16 1647) Confirmed Diagnosis Date: 04/25/16 (04/25/16 1647)         Multidisiplinary Clinic Date: 04/27/16 (04/25/16 1647)     Patient Visit Type: Initial (04/25/16 1647)                              Time Spent with Patient: 90 (04/25/16 1647)  Phoned patient to introduce navigation service, and schedule surgical, and Med/Onc appointments.  Worked with patient ,and her sister Lelon Frohlich.  Will meet at appointments on 04/27/16.  Notified Dr. Ernestina Patches at [facility] of appointments.

## 2016-04-27 NOTE — Progress Notes (Signed)
[place]  [phone] [phone]  ID: Kimberly Clarke OB: 1955/09/17  MR#: [id number]  JSH#:[id number]  [name], MD as PCP - General (Family Medicine)  CHIEF COMPLAINT: Clinical stage IIa ER/PR positive, HER-2 2+ invasive carcinoma of the upper outer quadrant of the right breast.  INTERVAL HISTORY: Patient is a 60 year old female who recently was noted to have an abnormal screening mammogram. Subsequent ultrasound biopsy revealed the above stated breast cancer. She is highly anxious, but otherwise feels well. She has no neurologic complaints. She denies any recent fevers or illnesses. She has good appetite and denies weight loss. She has no chest pain or shortness of breath. She denies any nausea, vomiting, constipation, or diarrhea. She has no urinary complaints. Patient otherwise feels well and offers no further specific complaints.   REVIEW OF SYSTEMS:   Review of Systems  Constitutional: Negative.  Negative for fever, malaise/fatigue and weight loss.  Respiratory: Negative.  Negative for cough.   Cardiovascular: Negative for chest pain and leg swelling.  Gastrointestinal: Negative.   Genitourinary: Negative.   Musculoskeletal: Negative.   Neurological: Negative.   Psychiatric/Behavioral: Positive for depression. The patient is nervous/anxious.     As per HPI. Otherwise, a complete review of systems is negative.  PAST MEDICAL HISTORY: Past Medical History:  Diagnosis Date  . Anxiety   . HSV infection    HISTORY OF HSV  . Postmenopause bleeding     PAST SURGICAL HISTORY: Past Surgical History:  Procedure Laterality Date  . ANAL FISTULECTOMY    . ENDOMETRIAL BIOPSY    . KNEE SURGERY     snow skating    FAMILY HISTORY: Family History  Problem Relation Age of Onset  . Cancer Mother     BREAST AND BONES  . Breast cancer Mother 70  . Emphysema Father   . Cancer Father   . Cancer Maternal Aunt       ADVANCED DIRECTIVES (Y/N):  N  HEALTH MAINTENANCE: Social History  Substance Use Topics  . Smoking status: Never Smoker  . Smokeless tobacco: Never Used  . Alcohol use Not on file     Comment: occasion     Colonoscopy:  PAP:  Bone density:  Lipid panel:  Allergies  Allergen Reactions  . Penicillins     Current Outpatient Prescriptions  Medication Sig Dispense Refill  . ALPRAZolam (XANAX) 0.25 MG tablet Take 1 tablet (0.25 mg total) by mouth at bedtime. (Patient taking differently: Take 0.25 mg by mouth at bedtime as needed. ) 30 tablet 0  . citalopram (CELEXA) 20 MG tablet Take 20 mg by mouth daily.      No current facility-administered medications for this visit.     OBJECTIVE: Vitals:   04/27/16 1401  BP: 137/83  Pulse: 89  Resp: 18  Temp: 98.4 F (36.9 C)     Body mass index is 23.98 kg/m.    ECOG FS:0 - Asymptomatic  General: Well-developed, well-nourished, no acute distress. Eyes: Pink conjunctiva, anicteric sclera. HEENT: Normocephalic, moist mucous membranes, clear oropharnyx. Breasts: Patient requested exam be deferred today. Lungs: Clear to auscultation bilaterally. Heart: Regular rate and rhythm. No rubs, murmurs, or gallops. Abdomen: Soft, nontender, nondistended. No organomegaly noted, normoactive bowel sounds. Musculoskeletal: No edema, cyanosis, or clubbing. Neuro: Alert, answering all questions appropriately. Cranial nerves grossly intact. Skin: No rashes or petechiae noted. Psych: Normal affect. Lymphatics: No cervical, calvicular, axillary or inguinal LAD.   LAB RESULTS:  Lab Results  Component Value Date   NA 140 03/23/2015   K 4.4 03/23/2015   CL 103 03/23/2015   CO2 29 03/23/2015   GLUCOSE 89 03/23/2015   BUN 15 03/23/2015   CREATININE 0.91 03/23/2015   CALCIUM 9.5 03/23/2015   PROT 7.4 03/23/2015   ALBUMIN 4.5 03/23/2015   AST 25 03/23/2015   ALT 25 03/23/2015   ALKPHOS 117 03/23/2015   BILITOT 0.4 03/23/2015    Lab  Results  Component Value Date   WBC 8.7 03/23/2015   HGB 14.3 03/23/2015   HCT 41.8 03/23/2015   MCV 87.3 03/23/2015   PLT 335 03/23/2015     STUDIES: US Breast Ltd Uni Right Inc Axilla  Result Date: 04/21/2016 CLINICAL DATA:  60 year old female with new palpable lump in the upper-outer right breast discovered on self-examination. EXAM: 2D DIGITAL DIAGNOSTIC BILATERAL MAMMOGRAM WITH CAD AND ADJUNCT TOMO ULTRASOUND RIGHT BREAST COMPARISON:  Previous exam(s). ACR Breast Density Category b: There are scattered areas of fibroglandular density. FINDINGS: A new spiculated mass containing suspicious calcifications within the upper-outer right breast is identified. No other worrisome mass, distortion or suspicious calcifications are noted bilaterally. Mammographic images were processed with CAD. On physical exam, a firm palpable mass identified at the 10 o'clock position of the right breast 4 cm from the nipple. Targeted ultrasound is performed, showing a 1.3 x 1.7 x 1.7 cm irregular hypoechoic mass at the 10 o'clock position of the right breast 4 cm from the nipple and containing calcifications. A right axillary lymph node with focal cortical thickening measuring up to 4.5 mm is identified. IMPRESSION: Highly suspicious 1.7 cm mass in the upper-outer right breast with borderline right axillary lymph node. Tissue sampling is recommended. RECOMMENDATION: Ultrasound-guided right breast and right axillary biopsies, which will be arranged. I have discussed the findings and recommendations with the patient. Results were also provided in writing at the conclusion of the visit. If applicable, a reminder letter will be sent to the patient regarding the next appointment. BI-RADS CATEGORY  5: Highly suggestive of malignancy. Electronically Signed   By: Margarette Canada M.D.   On: 04/21/2016 11:27   Mm Diag Breast Tomo Bilateral  Result Date: 04/21/2016 CLINICAL DATA:  60 year old female with new palpable lump in the  upper-outer right breast discovered on self-examination. EXAM: 2D DIGITAL DIAGNOSTIC BILATERAL MAMMOGRAM WITH CAD AND ADJUNCT TOMO ULTRASOUND RIGHT BREAST COMPARISON:  Previous exam(s). ACR Breast Density Category b: There are scattered areas of fibroglandular density. FINDINGS: A new spiculated mass containing suspicious calcifications within the upper-outer right breast is identified. No other worrisome mass, distortion or suspicious calcifications are noted bilaterally. Mammographic images were processed with CAD. On physical exam, a firm palpable mass identified at the 10 o'clock position of the right breast 4 cm from the nipple. Targeted ultrasound is performed, showing a 1.3 x 1.7 x 1.7 cm irregular hypoechoic mass at the 10 o'clock position of the right breast 4 cm from the nipple and containing calcifications. A right axillary lymph node with focal cortical thickening measuring up to 4.5 mm is identified. IMPRESSION: Highly suspicious 1.7 cm mass in the upper-outer right breast with borderline right axillary lymph node. Tissue sampling is recommended. RECOMMENDATION: Ultrasound-guided right breast and right axillary biopsies, which will be arranged. I have discussed the findings and recommendations with the patient. Results were also provided in writing at the conclusion of the visit. If applicable, a reminder letter will be sent to the patient regarding the next appointment. BI-RADS CATEGORY  5:  Highly suggestive of malignancy. Electronically Signed   By: Margarette Canada M.D.   On: 04/21/2016 11:27   Mm Clip Placement Right  Result Date: 04/21/2016 CLINICAL DATA:  Evaluate clip placement following ultrasound-guided right breast biopsies. EXAM: DIAGNOSTIC RIGHT MAMMOGRAM POST ULTRASOUND BIOPSY COMPARISON:  Previous exam(s). FINDINGS: Mammographic images were obtained following ultrasound guided biopsy of mass in the upper-outer right breast and right axillary lymph node. The coil shaped clip is in  satisfactory position corresponding to the biopsied mass in the upper-outer right breast. The biopsy clip corresponding to the right axillary lymph node biopsy is not visualized mammographically due to location. IMPRESSION: Satisfactory clip placement following ultrasound-guided right breast biopsy. Final Assessment: Post Procedure Mammograms for Marker Placement Electronically Signed   By: Margarette Canada M.D.   On: 04/21/2016 13:02   [place] Rt Breast Bx W Loc Dev 1st Lesion Img Bx Spec US Guide  Addendum Date: 04/26/2016   ADDENDUM REPORT: 04/26/2016 12:01 ADDENDUM: Pathology of the right breast biopsy revealed INVASIVE MAMMARY CARCINOMA, GRADE 2 AND DUCTAL CARCINOMA IN SITU, GRADE 2. This was found to be concordant by Dr. Brigitte Pulse. Recommendation:  Surgical and oncology referrals. At the patient's request, results and recommendations were relayed to the patient's sister, Lolita Patella by phone by Dr. Brigitte Pulse. Ms. Kimberly Clarke stated her sister has done well following the biopsy with no bleeding or hematoma. All of her questions were answered. She requested that the referral be made to see Dr. Tamala Julian. She will be contacted by the nurse navigators from [facility] with appointment information. She was encouraged to call the [place] [facility] with any further questions or concerns. Appointments were made with Dr. Tamala Julian on 04/27/16 at 9:00 AM and with Dr. Grayland Ormond, oncologist on 04/27/16 at 11:00 AM by Al Pimple, RN, nurse navigator. The information was relayed to the patient's sister, Lolita Patella. Addendum by Jetta Lout, RRA on 04/26/16. Electronically Signed   By: Margarette Canada M.D.   On: 04/26/2016 12:01   Result Date: 04/26/2016 CLINICAL DATA:  60 year old female with highly suspicious mass in the upper-outer right breast and mildly enlarged right axillary lymph node. For tissue sampling of suspicious mass in the upper-outer right breast.  EXAM: ULTRASOUND GUIDED RIGHT BREAST CORE NEEDLE BIOPSY COMPARISON:  Previous exam(s). FINDINGS: I met with the patient and we discussed the procedure of ultrasound-guided biopsy, including benefits and alternatives. We discussed the high likelihood of a successful procedure. We discussed the risks of the procedure, including infection, bleeding, tissue injury, clip migration, and inadequate sampling. Informed written consent was given. The usual time-out protocol was performed immediately prior to the procedure. Using sterile technique and 1% Lidocaine as local anesthetic, under direct ultrasound visualization, a 14 gauge spring-loaded device was used to perform biopsy of the 1.7 cm mass at the 10 o'clock position of the right breast 4 cm from the nipple using a lateral approach. At the conclusion of the procedure a coil shaped tissue marker clip was deployed into the biopsy cavity. Follow up 2 view mammogram was performed and dictated separately. IMPRESSION: Ultrasound guided biopsy of suspicious mass in the upper-outer right breast. No apparent complications. Pathology will be followed. Electronically Signed: By: Margarette Canada M.D. On: 04/21/2016 13:03   [place] Rt Breast Bx W Loc Dev Ea Add Lesion Img Bx Spec US Guide  Addendum Date: 04/26/2016   ADDENDUM REPORT: 04/26/2016 12:00 ADDENDUM: Pathology of the right axillary lymph node biopsy revealed LYMPH NODE WITH  METASTATIC CANCER. This was found to be concordant by Dr. Brigitte Pulse. Recommendation:  Surgical and oncology referrals. At the patient's request, results and recommendations were relayed to the patient's sister, Lolita Patella by phone by Dr. Brigitte Pulse on 04/24/16. Ms. Kimberly Clarke stated her sister has done well following the biopsy. All of her questions were answered. She requested that the referral be made to Dr. Tamala Julian. She was encouraged to call the [place] [facility] with any further questions or concerns.  Referrals were made with Dr. Tamala Julian on 04/27/16 at 9:00 AM and with Dr. Grayland Ormond, oncologist on 2/7/7 at 11:00 AM by Al Pimple, RN, nurse navigator for [facility]. Appointment information was relayed to the patient's sister, Lolita Patella. Addendum by Jetta Lout, RRA on 04/26/16. Electronically Signed   By: Margarette Canada M.D.   On: 04/26/2016 12:00   Result Date: 04/26/2016 CLINICAL DATA:  60 year old female with highly suspicious mass in the upper-outer right breast and mildly enlarged right axillary lymph node. For tissue sampling of mildly enlarged right axillary lymph node. EXAM: ULTRASOUND GUIDED CORE NEEDLE BIOPSY OF A RIGHT AXILLARY NODE COMPARISON:  Previous exam(s). FINDINGS: I met with the patient and we discussed the procedure of ultrasound-guided biopsy, including benefits and alternatives. We discussed the high likelihood of a successful procedure. We discussed the risks of the procedure, including infection, bleeding, tissue injury, clip migration, and inadequate sampling. Informed written consent was given. The usual time-out protocol was performed immediately prior to the procedure. Using sterile technique and 1% Lidocaine as local anesthetic, under direct ultrasound visualization, a 14 gauge spring-loaded device was used to perform biopsy of the mildly enlarged right axillary lymph node with 4.5 mm cortical thickening using a inferior approach. At the conclusion of the procedure a tissue marker clip was deployed into the biopsy cavity with placement confirmed sonographically. IMPRESSION: Ultrasound guided biopsy of mildly enlarged right axillary lymph node. No apparent complications. Pathology will be followed. Electronically Signed: By: Margarette Canada M.D. On: 04/21/2016 13:04    ASSESSMENT: Clinical stage IIa ER/PR positive, HER-2 2+ invasive carcinoma of the upper outer quadrant of the right breast.  PLAN:    1. Clinical stage IIa ER/PR positive, HER-2 2+  invasive carcinoma of the upper outer quadrant of the right breast: Given the size of patient's tumor and stage of disease, have recommended lumpectomy with axillary node removal but will further discuss with surgery prior to making a final decision. Patient will also require chemotherapy, but are waiting HER-2 status to determine which treatment regimen would most benefit her. Patient will require a port placement. She will require adjuvant XRT at the conclusion of her chemotherapy. She will also benefit from an aromatase inhibitor for 5 years given the ER/PR status of her tumor. Have ordered a MUGA test as well as a CT of the chest to complete the staging workup and in preparation for chemotherapy. No follow-up has been scheduled at this time. Will discuss with surgery early next week to determine whether to proceed with neoadjuvant chemotherapy or proceed with lumpectomy first. Patient will then be called with the appropriate follow-up appointments.  Approximately 45 minutes was spent in discussion of which greater than 50% was consultation.  Patient expressed understanding and was in agreement with this plan. She also understands that She can call clinic at any time with any questions, concerns, or complaints.   Primary cancer of upper outer quadrant of right female breast [facility])   Staging form:  Breast, AJCC 7th Edition   - Clinical stage from 04/26/2016: Stage IIA (T1c, N1, M0) - Signed by Lloyd Huger, MD on 04/28/2016  Lloyd Huger, MD   04/28/2016 4:59 PM

## 2016-04-27 NOTE — Progress Notes (Signed)
  Oncology Nurse Navigator Documentation  Navigator Location: [facility]-Med Onc (04/27/16 1500) Referral date to RadOnc/MedOnc: 04/27/16 (04/27/16 1500) )Navigator Encounter Type: Clinic/MDC (04/27/16 1500)                     Patient Visit Type: Initial;MedOnc (04/27/16 1500) Treatment Phase: Pre-Tx/Tx Discussion (04/27/16 1500)                            Time Spent with Patient: 120 (04/27/16 1500)  Met with patient, sister Judeen Hammans, at initial Med Onc visit.   Given Breast Cancer Treatment Handbook/folder with hospital services.  Dr. Grayland Ormond reviewed pathology, and discussed plans for treatment, dependent on discussion with Dr. Tamala Julian, and pending pathology results.

## 2016-05-01 LAB — SURGICAL PATHOLOGY

## 2016-05-02 NOTE — Progress Notes (Signed)
  Oncology Nurse Navigator Documentation  Navigator Location: [facility]-Med Onc (05/02/16 1600)   )Navigator Encounter Type: Telephone (05/02/16 1600) Telephone: Incoming Call;Outgoing Call;Appt Confirmation/Clarification;Education (05/02/16 1600)                     Treatment Phase: Pre-Tx/Tx Discussion (05/02/16 1600)                            Time Spent with Patient: 60 (05/02/16 1600)  Per Dr. Grayland Ormond, patient has choice between neo-adjuvant, and adjuvant chemotherapy treatment.  Patient notified to make decision, and Dr. Grayland Ormond, and Dr. Tamala Julian will be notified, so follow-up can be planned.  Per patient's request , also spoke to sister Lelon Frohlich.  Reminded of upcoming  MUGA , and CT scan appointments.

## 2016-05-04 ENCOUNTER — Ambulatory Visit: Payer: Commercial Managed Care - HMO

## 2016-05-04 ENCOUNTER — Encounter: Payer: Self-pay | Admitting: Diagnostic Radiology

## 2016-05-04 ENCOUNTER — Other Ambulatory Visit: Payer: Commercial Managed Care - HMO

## 2016-05-04 ENCOUNTER — Encounter: Payer: Self-pay | Admitting: *Deleted

## 2016-05-04 NOTE — Progress Notes (Signed)
  Oncology Nurse Navigator Documentation  Navigator Location: [facility]-Med Onc (05/04/16 1100)   )Navigator Encounter Type: Telephone (05/04/16 1100) Telephone: Lahoma Crocker Call;Incoming Call;Appt Confirmation/Clarification;Education (05/04/16 1100)                       Barriers/Navigation Needs: Coordination of Care;Education;Financial (05/04/16 1100)                          Time Spent with Patient: 60 (05/04/16 1100)  Patient requests information about MUGA ,and CT appointment dates, time, and place.  Sent schedule to patient.  Spoke with Colletta Maryland in [place] at [place].  She will meet with patient tomorrow when patient is there for MUGA, to review CT prep.  Patient instructed to meet Tanya Nones RN in [place] at 2:30, after MUGA.  Patient also has questions about insurance ,and prior authorization.  Given info to Marita Kansas, Patient Financial Advocate, who will ontact patient to address concerns.

## 2016-05-04 NOTE — Progress Notes (Signed)
  Oncology Nurse Navigator Documentation  Navigator Location: [facility]-Med Onc (05/03/16 1130)   )Navigator Encounter Type: Telephone (05/03/16 1130) Telephone: Lahoma Crocker Call;Incoming Call (05/03/16 1130)                     Treatment Phase: Pre-Tx/Tx Discussion (05/03/16 1130)                            Time Spent with Patient: 60 (05/03/16 1130)  Patient has multiple questions about treatment , and benefits of treatment sequence.  Spoke with patient, and Dr. Grayland Ormond.  Explained to patient  it would be beneficial for  her to return to [place] to review her questions, and concerns about treatment.  Scheduled patient to meet with Tanya Nones RN navigator on 05/05/16 at 2:30.  Reminded patient of MUGA scan sheduled 05/05/16, and CT on 05/08/16.

## 2016-05-05 ENCOUNTER — Encounter
Admission: RE | Admit: 2016-05-05 | Discharge: 2016-05-05 | Disposition: A | Discharge status: Home / Self Care | Payer: Commercial Managed Care - HMO | Source: Ambulatory Visit | Attending: Oncology | Admitting: Oncology

## 2016-05-05 ENCOUNTER — Encounter: Payer: Self-pay | Admitting: *Deleted

## 2016-05-05 DIAGNOSIS — C50411 Malignant neoplasm of upper-outer quadrant of right female breast: Secondary | ICD-10-CM | POA: Diagnosis not present

## 2016-05-05 DIAGNOSIS — Z170 Estrogen receptor positive status [ER+]: Secondary | ICD-10-CM | POA: Insufficient documentation

## 2016-05-05 MED ORDER — TECHNETIUM TC 99M-LABELED RED BLOOD CELLS IV KIT
20.0000 | PACK | Freq: Once | INTRAVENOUS | Status: AC | PRN
  Administered 2016-05-05: 22 via INTRAVENOUS

## 2016-05-05 NOTE — Progress Notes (Signed)
  Oncology Nurse Navigator Documentation  Navigator Location: [facility]Med Onc (05/05/16 1600)   )Navigator Encounter Type: Other (05/05/16 1600)    Met with patient and her friend today.  Patient had multiple questions concerning treatment, surgery, and side effects of treatment.  Reviewed treatments in general for breast cancer since she does not have a treatment plan yet.  She is still undecided whether to have neoadjuvant or adjuvant chemo.  She is going to decide and email myself and Webb Silversmith on Monday with a decision so we can move forward with treatment planning.  She is to call if she has any questions or needs.    Treatment Phase: Pre-Tx/Tx Discussion (05/05/16 1600) Barriers/Navigation Needs: Education (05/05/16 1600) Education: Understanding Cancer/ Treatment Options;Coping with Diagnosis/ Prognosis (05/05/16 1600)                        Time Spent with Patient: 90 (05/05/16 1600)

## 2016-05-08 ENCOUNTER — Ambulatory Visit
Admission: RE | Admit: 2016-05-08 | Discharge: 2016-05-08 | Disposition: A | Discharge status: Home / Self Care | Payer: 59 | Source: Ambulatory Visit | Attending: Oncology | Admitting: Oncology

## 2016-05-08 DIAGNOSIS — R918 Other nonspecific abnormal finding of lung field: Secondary | ICD-10-CM | POA: Insufficient documentation

## 2016-05-08 DIAGNOSIS — R590 Localized enlarged lymph nodes: Secondary | ICD-10-CM | POA: Diagnosis not present

## 2016-05-08 DIAGNOSIS — Z170 Estrogen receptor positive status [ER+]: Secondary | ICD-10-CM | POA: Diagnosis not present

## 2016-05-08 DIAGNOSIS — Z780 Asymptomatic menopausal state: Secondary | ICD-10-CM | POA: Insufficient documentation

## 2016-05-08 DIAGNOSIS — C50411 Malignant neoplasm of upper-outer quadrant of right female breast: Secondary | ICD-10-CM | POA: Insufficient documentation

## 2016-05-08 MED ORDER — IOPAMIDOL (ISOVUE-300) INJECTION 61%
100.0000 mL | Freq: Once | INTRAVENOUS | Status: AC | PRN
  Administered 2016-05-08: 100 mL via INTRAVENOUS

## 2016-05-09 ENCOUNTER — Other Ambulatory Visit: Payer: Self-pay | Admitting: *Deleted

## 2016-05-09 DIAGNOSIS — R935 Abnormal findings on diagnostic imaging of other abdominal regions, including retroperitoneum: Secondary | ICD-10-CM

## 2016-05-10 ENCOUNTER — Telehealth: Payer: Self-pay | Admitting: *Deleted

## 2016-05-10 ENCOUNTER — Other Ambulatory Visit: Payer: Self-pay | Admitting: Oncology

## 2016-05-10 DIAGNOSIS — R9389 Abnormal findings on diagnostic imaging of other specified body structures: Secondary | ICD-10-CM

## 2016-05-10 NOTE — Telephone Encounter (Signed)
Kimberly Clarke tried to call her yesterday.

## 2016-05-10 NOTE — Telephone Encounter (Signed)
Called pt again and pt did not answer.

## 2016-05-10 NOTE — Telephone Encounter (Signed)
Patient would like to have her CT results

## 2016-05-10 NOTE — Telephone Encounter (Signed)
Attempted to call patient with CT results. Pt did not answer and unable to leave message. Will try again later today.

## 2016-05-11 ENCOUNTER — Telehealth: Payer: Self-pay | Admitting: *Deleted

## 2016-05-11 DIAGNOSIS — R9389 Abnormal findings on diagnostic imaging of other specified body structures: Secondary | ICD-10-CM

## 2016-05-11 NOTE — Telephone Encounter (Signed)
Upon placing order for [place], was flagged that there was already an existing order for the Pelvic US which was ordered by Dr Grayland Ormond, will await results.  If [place] still shows thickened endometrium pt can schedule EMB with Dr Kennon Rounds.

## 2016-05-11 NOTE — Telephone Encounter (Signed)
-----   Message from [name], MD sent at 05/10/2016  1:17 PM EST ----- Regarding: RE: [employer] [facility]. Go ahead and order the u/s--I do not see results. We can add on for EMB anytime--- ----- Message ----- From: Ricka Burdock, RN Sent: 05/09/2016   2:48 PM To: [name], MD Subject: Melton Alar: [employer].                                 Please advise, thanks.  ----- Message ----- From: Francia Greaves Sent: 05/09/2016   2:34 PM To: Ricka Burdock, RN Subject: [employer].                                     Dr. Grayland Ormond (med oncology) called about this patient, he feels like its a good idea for this patient to have a pelvic ultrasound before she starts chemo, CT that showed thickened endometrium (which is unusual for a PM woman)  1st chemo treatment on 01/04  Schedule w/ Kennon Rounds on 01/02  Koren Shiver from [facility] Water quality scientist) @ [place] [phone] or [phone]

## 2016-05-12 NOTE — Patient Instructions (Signed)
Doxorubicin injection What is this medicine? DOXORUBICIN (dox oh ROO bi sin) is a chemotherapy drug. It is used to treat many kinds of cancer like leukemia, lymphoma, neuroblastoma, sarcoma, and Wilms' tumor. It is also used to treat bladder cancer, breast cancer, lung cancer, ovarian cancer, stomach cancer, and thyroid cancer. This medicine may be used for other purposes; ask your health care provider or pharmacist if you have questions. COMMON BRAND NAME(S): Adriamycin, Adriamycin PFS, Adriamycin RDF, Rubex What should I tell my health care provider before I take this medicine? They need to know if you have any of these conditions: -heart disease -history of low blood counts caused by a medicine -liver disease -recent or ongoing radiation therapy -an unusual or allergic reaction to doxorubicin, other chemotherapy agents, other medicines, foods, dyes, or preservatives -pregnant or trying to get pregnant -breast-feeding How should I use this medicine? This drug is given as an infusion into a vein. It is administered in a hospital or clinic by a specially trained health care professional. If you have pain, swelling, burning or any unusual feeling around the site of your injection, tell your health care professional right away. Talk to your pediatrician regarding the use of this medicine in children. Special care may be needed. Overdosage: If you think you have taken too much of this medicine contact a poison control center or emergency room at once. NOTE: This medicine is only for you. Do not share this medicine with others. What if I miss a dose? It is important not to miss your dose. Call your doctor or health care professional if you are unable to keep an appointment. What may interact with this medicine? This medicine may interact with the following medications: -6-mercaptopurine -paclitaxel -phenytoin -St. John's Wort -trastuzumab -verapamil This list may not describe all possible  interactions. Give your health care provider a list of all the medicines, herbs, non-prescription drugs, or dietary supplements you use. Also tell them if you smoke, drink alcohol, or use illegal drugs. Some items may interact with your medicine. What should I watch for while using this medicine? This drug may make you feel generally unwell. This is not uncommon, as chemotherapy can affect healthy cells as well as cancer cells. Report any side effects. Continue your course of treatment even though you feel ill unless your doctor tells you to stop. There is a maximum amount of this medicine you should receive throughout your life. The amount depends on the medical condition being treated and your overall health. Your doctor will watch how much of this medicine you receive in your lifetime. Tell your doctor if you have taken this medicine before. You may need blood work done while you are taking this medicine. Your urine may turn red for a few days after your dose. This is not blood. If your urine is dark or brown, call your doctor. In some cases, you may be given additional medicines to help with side effects. Follow all directions for their use. Call your doctor or health care professional for advice if you get a fever, chills or sore throat, or other symptoms of a cold or flu. Do not treat yourself. This drug decreases your body's ability to fight infections. Try to avoid being around people who are sick. This medicine may increase your risk to bruise or bleed. Call your doctor or health care professional if you notice any unusual bleeding. Talk to your doctor about your risk of cancer. You may be more at risk for certain   types of cancers if you take this medicine. Do not become pregnant while taking this medicine or for 6 months after stopping it. Women should inform their doctor if they wish to become pregnant or think they might be pregnant. Men should not father a child while taking this medicine and  for 6 months after stopping it. There is a potential for serious side effects to an unborn child. Talk to your health care professional or pharmacist for more information. Do not breast-feed an infant while taking this medicine. This medicine has caused ovarian failure in some women and reduced sperm counts in some men This medicine may interfere with the ability to have a child. Talk with your doctor or health care professional if you are concerned about your fertility. What side effects may I notice from receiving this medicine? Side effects that you should report to your doctor or health care professional as soon as possible: -allergic reactions like skin rash, itching or hives, swelling of the face, lips, or tongue -breathing problems -chest pain -fast or irregular heartbeat -low blood counts - this medicine may decrease the number of white blood cells, red blood cells and platelets. You may be at increased risk for infections and bleeding. -pain, redness, or irritation at site where injected -signs of infection - fever or chills, cough, sore throat, pain or difficulty passing urine -signs of decreased platelets or bleeding - bruising, pinpoint red spots on the skin, black, tarry stools, blood in the urine -swelling of the ankles, feet, hands -tiredness -weakness Side effects that usually do not require medical attention (report to your doctor or health care professional if they continue or are bothersome): -diarrhea -hair loss -mouth sores -nail discoloration or damage -nausea -red colored urine -vomiting This list may not describe all possible side effects. Call your doctor for medical advice about side effects. You may report side effects to FDA at 1-800-FDA-1088. Where should I keep my medicine? This drug is given in a hospital or clinic and will not be stored at home. NOTE: This sheet is a summary. It may not cover all possible information. If you have questions about this  medicine, talk to your doctor, pharmacist, or health care provider.  2017 Elsevier/Gold Standard (2015-07-05 11:28:51) Cyclophosphamide injection What is this medicine? CYCLOPHOSPHAMIDE (sye kloe FOSS fa mide) is a chemotherapy drug. It slows the growth of cancer cells. This medicine is used to treat many types of cancer like lymphoma, myeloma, leukemia, breast cancer, and ovarian cancer, to name a few. This medicine may be used for other purposes; ask your health care provider or pharmacist if you have questions. COMMON BRAND NAME(S): Cytoxan, Neosar What should I tell my health care provider before I take this medicine? They need to know if you have any of these conditions: -blood disorders -history of other chemotherapy -infection -kidney disease -liver disease -recent or ongoing radiation therapy -tumors in the bone marrow -an unusual or allergic reaction to cyclophosphamide, other chemotherapy, other medicines, foods, dyes, or preservatives -pregnant or trying to get pregnant -breast-feeding How should I use this medicine? This drug is usually given as an injection into a vein or muscle or by infusion into a vein. It is administered in a hospital or clinic by a specially trained health care professional. Talk to your pediatrician regarding the use of this medicine in children. Special care may be needed. Overdosage: If you think you have taken too much of this medicine contact a poison control center or emergency room at  once. NOTE: This medicine is only for you. Do not share this medicine with others. What if I miss a dose? It is important not to miss your dose. Call your doctor or health care professional if you are unable to keep an appointment. What may interact with this medicine? This medicine may interact with the following medications: -amiodarone -amphotericin B -azathioprine -certain antiviral medicines for HIV or AIDS such as protease inhibitors (e.g., indinavir,  ritonavir) and zidovudine -certain blood pressure medications such as benazepril, captopril, enalapril, fosinopril, lisinopril, moexipril, monopril, perindopril, quinapril, ramipril, trandolapril -certain cancer medications such as anthracyclines (e.g., daunorubicin, doxorubicin), busulfan, cytarabine, paclitaxel, pentostatin, tamoxifen, trastuzumab -certain diuretics such as chlorothiazide, chlorthalidone, hydrochlorothiazide, indapamide, metolazone -certain medicines that treat or prevent blood clots like warfarin -certain muscle relaxants such as succinylcholine -cyclosporine -etanercept -indomethacin -medicines to increase blood counts like filgrastim, pegfilgrastim, sargramostim -medicines used as general anesthesia -metronidazole -natalizumab This list may not describe all possible interactions. Give your health care provider a list of all the medicines, herbs, non-prescription drugs, or dietary supplements you use. Also tell them if you smoke, drink alcohol, or use illegal drugs. Some items may interact with your medicine. What should I watch for while using this medicine? Visit your doctor for checks on your progress. This drug may make you feel generally unwell. This is not uncommon, as chemotherapy can affect healthy cells as well as cancer cells. Report any side effects. Continue your course of treatment even though you feel ill unless your doctor tells you to stop. Drink water or other fluids as directed. Urinate often, even at night. In some cases, you may be given additional medicines to help with side effects. Follow all directions for their use. Call your doctor or health care professional for advice if you get a fever, chills or sore throat, or other symptoms of a cold or flu. Do not treat yourself. This drug decreases your body's ability to fight infections. Try to avoid being around people who are sick. This medicine may increase your risk to bruise or bleed. Call your doctor or  health care professional if you notice any unusual bleeding. Be careful brushing and flossing your teeth or using a toothpick because you may get an infection or bleed more easily. If you have any dental work done, tell your dentist you are receiving this medicine. You may get drowsy or dizzy. Do not drive, use machinery, or do anything that needs mental alertness until you know how this medicine affects you. Do not become pregnant while taking this medicine or for 1 year after stopping it. Women should inform their doctor if they wish to become pregnant or think they might be pregnant. Men should not father a child while taking this medicine and for 4 months after stopping it. There is a potential for serious side effects to an unborn child. Talk to your health care professional or pharmacist for more information. Do not breast-feed an infant while taking this medicine. This medicine may interfere with the ability to have a child. This medicine has caused ovarian failure in some women. This medicine has caused reduced sperm counts in some men. You should talk with your doctor or health care professional if you are concerned about your fertility. If you are going to have surgery, tell your doctor or health care professional that you have taken this medicine. What side effects may I notice from receiving this medicine? Side effects that you should report to your doctor or health care professional as   soon as possible: -allergic reactions like skin rash, itching or hives, swelling of the face, lips, or tongue -low blood counts - this medicine may decrease the number of white blood cells, red blood cells and platelets. You may be at increased risk for infections and bleeding. -signs of infection - fever or chills, cough, sore throat, pain or difficulty passing urine -signs of decreased platelets or bleeding - bruising, pinpoint red spots on the skin, black, tarry stools, blood in the urine -signs of  decreased red blood cells - unusually weak or tired, fainting spells, lightheadedness -breathing problems -dark urine -dizziness -palpitations -swelling of the ankles, feet, hands -trouble passing urine or change in the amount of urine -weight gain -yellowing of the eyes or skin Side effects that usually do not require medical attention (report to your doctor or health care professional if they continue or are bothersome): -changes in nail or skin color -hair loss -missed menstrual periods -mouth sores -nausea, vomiting This list may not describe all possible side effects. Call your doctor for medical advice about side effects. You may report side effects to FDA at 1-800-FDA-1088. Where should I keep my medicine? This drug is given in a hospital or clinic and will not be stored at home. NOTE: This sheet is a summary. It may not cover all possible information. If you have questions about this medicine, talk to your doctor, pharmacist, or health care provider.  2017 Elsevier/Gold Standard (2012-03-22 16:22:58) Paclitaxel injection What is this medicine? PACLITAXEL (PAK li TAX el) is a chemotherapy drug. It targets fast dividing cells, like cancer cells, and causes these cells to die. This medicine is used to treat ovarian cancer, breast cancer, and other cancers. This medicine may be used for other purposes; ask your health care provider or pharmacist if you have questions. COMMON BRAND NAME(S): Onxol, Taxol What should I tell my health care provider before I take this medicine? They need to know if you have any of these conditions: -blood disorders -irregular heartbeat -infection (especially a virus infection such as chickenpox, cold sores, or herpes) -liver disease -previous or ongoing radiation therapy -an unusual or allergic reaction to paclitaxel, alcohol, polyoxyethylated castor oil, other chemotherapy agents, other medicines, foods, dyes, or preservatives -pregnant or trying to  get pregnant -breast-feeding How should I use this medicine? This drug is given as an infusion into a vein. It is administered in a hospital or clinic by a specially trained health care professional. Talk to your pediatrician regarding the use of this medicine in children. Special care may be needed. Overdosage: If you think you have taken too much of this medicine contact a poison control center or emergency room at once. NOTE: This medicine is only for you. Do not share this medicine with others. What if I miss a dose? It is important not to miss your dose. Call your doctor or health care professional if you are unable to keep an appointment. What may interact with this medicine? Do not take this medicine with any of the following medications: -disulfiram -metronidazole This medicine may also interact with the following medications: -cyclosporine -diazepam -ketoconazole -medicines to increase blood counts like filgrastim, pegfilgrastim, sargramostim -other chemotherapy drugs like cisplatin, doxorubicin, epirubicin, etoposide, teniposide, vincristine -quinidine -testosterone -vaccines -verapamil Talk to your doctor or health care professional before taking any of these medicines: -acetaminophen -aspirin -ibuprofen -ketoprofen -naproxen This list may not describe all possible interactions. Give your health care provider a list of all the medicines, herbs, non-prescription drugs, or  dietary supplements you use. Also tell them if you smoke, drink alcohol, or use illegal drugs. Some items may interact with your medicine. What should I watch for while using this medicine? Your condition will be monitored carefully while you are receiving this medicine. You will need important blood work done while you are taking this medicine. This medicine can cause serious allergic reactions. To reduce your risk you will need to take other medicine(s) before treatment with this medicine. If you  experience allergic reactions like skin rash, itching or hives, swelling of the face, lips, or tongue, tell your doctor or health care professional right away. In some cases, you may be given additional medicines to help with side effects. Follow all directions for their use. This drug may make you feel generally unwell. This is not uncommon, as chemotherapy can affect healthy cells as well as cancer cells. Report any side effects. Continue your course of treatment even though you feel ill unless your doctor tells you to stop. Call your doctor or health care professional for advice if you get a fever, chills or sore throat, or other symptoms of a cold or flu. Do not treat yourself. This drug decreases your body's ability to fight infections. Try to avoid being around people who are sick. This medicine may increase your risk to bruise or bleed. Call your doctor or health care professional if you notice any unusual bleeding. Be careful brushing and flossing your teeth or using a toothpick because you may get an infection or bleed more easily. If you have any dental work done, tell your dentist you are receiving this medicine. Avoid taking products that contain aspirin, acetaminophen, ibuprofen, naproxen, or ketoprofen unless instructed by your doctor. These medicines may hide a fever. Do not become pregnant while taking this medicine. Women should inform their doctor if they wish to become pregnant or think they might be pregnant. There is a potential for serious side effects to an unborn child. Talk to your health care professional or pharmacist for more information. Do not breast-feed an infant while taking this medicine. Men are advised not to father a child while receiving this medicine. This product may contain alcohol. Ask your pharmacist or healthcare provider if this medicine contains alcohol. Be sure to tell all healthcare providers you are taking this medicine. Certain medicines, like metronidazole  and disulfiram, can cause an unpleasant reaction when taken with alcohol. The reaction includes flushing, headache, nausea, vomiting, sweating, and increased thirst. The reaction can last from 30 minutes to several hours. What side effects may I notice from receiving this medicine? Side effects that you should report to your doctor or health care professional as soon as possible: -allergic reactions like skin rash, itching or hives, swelling of the face, lips, or tongue -low blood counts - This drug may decrease the number of white blood cells, red blood cells and platelets. You may be at increased risk for infections and bleeding. -signs of infection - fever or chills, cough, sore throat, pain or difficulty passing urine -signs of decreased platelets or bleeding - bruising, pinpoint red spots on the skin, black, tarry stools, nosebleeds -signs of decreased red blood cells - unusually weak or tired, fainting spells, lightheadedness -breathing problems -chest pain -high or low blood pressure -mouth sores -nausea and vomiting -pain, swelling, redness or irritation at the injection site -pain, tingling, numbness in the hands or feet -slow or irregular heartbeat -swelling of the ankle, feet, hands Side effects that usually do not  require medical attention (report to your doctor or health care professional if they continue or are bothersome): -bone pain -complete hair loss including hair on your head, underarms, pubic hair, eyebrows, and eyelashes -changes in the color of fingernails -diarrhea -loosening of the fingernails -loss of appetite -muscle or joint pain -red flush to skin -sweating This list may not describe all possible side effects. Call your doctor for medical advice about side effects. You may report side effects to FDA at 1-800-FDA-1088. Where should I keep my medicine? This drug is given in a hospital or clinic and will not be stored at home. NOTE: This sheet is a summary. It  may not cover all possible information. If you have questions about this medicine, talk to your doctor, pharmacist, or health care provider.  2017 Elsevier/Gold Standard (2015-03-09 19:58:00)

## 2016-05-13 ENCOUNTER — Other Ambulatory Visit: Payer: Self-pay | Admitting: Oncology

## 2016-05-13 DIAGNOSIS — C50411 Malignant neoplasm of upper-outer quadrant of right female breast: Secondary | ICD-10-CM

## 2016-05-13 MED ORDER — LIDOCAINE-PRILOCAINE 2.5-2.5 % EX CREA: TOPICAL_CREAM | CUTANEOUS | 3 refills | Status: DC

## 2016-05-13 MED ORDER — PROCHLORPERAZINE MALEATE 10 MG PO TABS: 10.0000 mg | ORAL_TABLET | Freq: Four times a day (QID) | ORAL | 1 refills | Status: DC | PRN

## 2016-05-13 MED ORDER — ONDANSETRON HCL 8 MG PO TABS: 8.0000 mg | ORAL_TABLET | Freq: Two times a day (BID) | ORAL | 1 refills | Status: DC | PRN

## 2016-05-13 NOTE — Progress Notes (Signed)
START ON PATHWAY REGIMEN - Breast  [id number]: Dose-Dense AC-T (Paclitaxel Weekly) - [Doxorubicin + Cyclophosphamide q14 Days x 4 Cycles, Followed by Paclitaxel 80 mg/m2 Weekly x 12 Weeks]  Dose-Dense AC q14 days:   A cycle is every 14 days:     Doxorubicin (Adriamycin(R)) 60 mg/m2 IV push on day 1 only. Dose Mod: None     Cyclophosphamide (Cytoxan(R)) 600 mg/m2 in 250 mL NS IV over 30 minutes on day 1 only. Dose Mod: None     Pegfilgrastim (Neulasta(R)) 6 mg flat dose subcutaneously once on day 2 only.  G-CSF recommended due to data showing a >20% risk of febrile neutropenia. Dose Mod: None  **Always confirm dose/schedule in your pharmacy ordering system**    Paclitaxel 80 mg/m2 Weekly:   Administer weekly:     Paclitaxel (Taxol(R)) 80 mg/m2 in 250 mL NS IV over 1 hour Dose Mod: None  **Always confirm dose/schedule in your pharmacy ordering system**    Patient Characteristics: Neoadjuvant Chemotherapy, HER2/neu Negative/Unknown/Equivocal, ER Positive AJCC Stage Grouping: IIA Current Disease Status: No Distant Mets or Local Recurrence AJCC M Stage: X ER Status: Positive (+) AJCC N Stage: X AJCC T Stage: X HER2/neu: Negative (-) PR Status: Positive (+)  Intent of Therapy: Curative Intent, Discussed with Patient

## 2016-05-18 ENCOUNTER — Inpatient Hospital Stay: Payer: Commercial Managed Care - HMO

## 2016-05-19 ENCOUNTER — Ambulatory Visit
Admission: RE | Admit: 2016-05-19 | Discharge: 2016-05-19 | Disposition: A | Discharge status: Home / Self Care | Payer: Commercial Managed Care - HMO | Source: Ambulatory Visit | Attending: Oncology | Admitting: Oncology

## 2016-05-19 ENCOUNTER — Inpatient Hospital Stay: Payer: Commercial Managed Care - HMO

## 2016-05-19 ENCOUNTER — Encounter: Payer: Self-pay | Admitting: *Deleted

## 2016-05-19 ENCOUNTER — Other Ambulatory Visit: Payer: Self-pay | Admitting: *Deleted

## 2016-05-19 ENCOUNTER — Encounter
Admission: RE | Admit: 2016-05-19 | Discharge: 2016-05-19 | Disposition: A | Discharge status: Home / Self Care | Payer: Commercial Managed Care - HMO | Source: Ambulatory Visit | Attending: Surgery | Admitting: Surgery

## 2016-05-19 ENCOUNTER — Telehealth: Payer: Self-pay | Admitting: *Deleted

## 2016-05-19 DIAGNOSIS — N83202 Unspecified ovarian cyst, left side: Secondary | ICD-10-CM | POA: Insufficient documentation

## 2016-05-19 DIAGNOSIS — Z01818 Encounter for other preprocedural examination: Secondary | ICD-10-CM

## 2016-05-19 DIAGNOSIS — R935 Abnormal findings on diagnostic imaging of other abdominal regions, including retroperitoneum: Secondary | ICD-10-CM | POA: Diagnosis not present

## 2016-05-19 DIAGNOSIS — C50411 Malignant neoplasm of upper-outer quadrant of right female breast: Secondary | ICD-10-CM | POA: Diagnosis not present

## 2016-05-19 DIAGNOSIS — N83201 Unspecified ovarian cyst, right side: Secondary | ICD-10-CM | POA: Insufficient documentation

## 2016-05-19 DIAGNOSIS — Z780 Asymptomatic menopausal state: Secondary | ICD-10-CM | POA: Insufficient documentation

## 2016-05-19 DIAGNOSIS — R9389 Abnormal findings on diagnostic imaging of other specified body structures: Secondary | ICD-10-CM

## 2016-05-19 LAB — CBC WITH DIFFERENTIAL/PLATELET
BASOS PCT: 1 %
Basophils Absolute: 0.1 10*3/uL (ref 0–0.1)
EOS PCT: 2 %
Eosinophils Absolute: 0.1 10*3/uL (ref 0–0.7)
HEMATOCRIT: 40.9 % (ref 35.0–47.0)
Hemoglobin: 13.7 g/dL (ref 12.0–16.0)
Lymphocytes Relative: 22 %
Lymphs Abs: 1.5 10*3/uL (ref 1.0–3.6)
MCH: 30 pg (ref 26.0–34.0)
MCHC: 33.5 g/dL (ref 32.0–36.0)
MCV: 89.5 fL (ref 80.0–100.0)
MONO ABS: 0.5 10*3/uL (ref 0.2–0.9)
MONOS PCT: 7 %
Neutro Abs: 4.6 10*3/uL (ref 1.4–6.5)
Neutrophils Relative %: 68 %
PLATELETS: 306 10*3/uL (ref 150–440)
RBC: 4.57 MIL/uL (ref 3.80–5.20)
RDW: 12.6 % (ref 11.5–14.5)
WBC: 6.8 10*3/uL (ref 3.6–11.0)

## 2016-05-19 LAB — COMPREHENSIVE METABOLIC PANEL
ALBUMIN: 4.2 g/dL (ref 3.5–5.0)
ALT: 21 U/L (ref 14–54)
ANION GAP: 4 — AB (ref 5–15)
AST: 24 U/L (ref 15–41)
Alkaline Phosphatase: 83 U/L (ref 38–126)
BILIRUBIN TOTAL: 0.5 mg/dL (ref 0.3–1.2)
BUN: 15 mg/dL (ref 6–20)
CO2: 30 mmol/L (ref 22–32)
Calcium: 9 mg/dL (ref 8.9–10.3)
Chloride: 103 mmol/L (ref 101–111)
Creatinine, Ser: 0.74 mg/dL (ref 0.44–1.00)
GFR calc Af Amer: >60 mL/min (ref >60)
Glucose, Bld: 73 mg/dL (ref 65–99)
POTASSIUM: 3.7 mmol/L (ref 3.5–5.1)
Sodium: 137 mmol/L (ref 135–145)
TOTAL PROTEIN: 7 g/dL (ref 6.5–8.1)

## 2016-05-19 NOTE — Progress Notes (Signed)
  Oncology Nurse Navigator Documentation  Navigator Location: [facility] (05/19/16 0900)   )Navigator Encounter Type: Letter/Fax/Email (05/19/16 0900) Telephone: Appt Confirmation/Clarification (05/19/16 0900)                     Treatment Phase: Pre-Tx/Tx Discussion (05/19/16 0900) Barriers/Navigation Needs: Coordination of Care (05/19/16 0900) Education: Other (appt) (05/19/16 0900)                        Time Spent with Patient: 60 (05/19/16 0900)

## 2016-05-19 NOTE — Patient Instructions (Signed)
  Your procedure is scheduled TD:[id number] Jan. 2 , 2018. Report to Same Day Surgery at 12:30pm.  Remember: Instructions that are not followed completely may result in serious medical risk, up to and including death, or upon the discretion of your surgeon and anesthesiologist your surgery may need to be rescheduled.    _x___ 1. Do not eat food or drink liquids after midnight. No gum chewing or hard candies.     _x___ 2. No Alcohol for 24 hours before or after surgery.   ____ 3. Bring all medications with you on the day of surgery if instructed.    __x__ 4. Notify your doctor if there is any change in your medical condition     (cold, fever, infections).    _____ 5. No smoking 24 hours prior to surgery.     Do not wear jewelry, make-up, hairpins, clips or nail polish.  Do not wear lotions, powders, or perfumes.   Do not shave 48 hours prior to surgery. Men may shave face and neck.  Do not bring valuables to the hospital.    [facility] is not responsible for any belongings or valuables.               Contacts, dentures or bridgework may not be worn into surgery.  Leave your suitcase in the car. After surgery it may be brought to your room.  For patients admitted to the hospital, discharge time is determined by your treatment team.   Patients discharged the day of surgery will not be allowed to drive home.    Please read over the following fact sheets that you were given:   [facility] Preparing for Surgery  ____ Take these medicines the morning of surgery with A SIP OF WATER: None     ____ Fleet Enema (as directed)   ____ Use CHG Soap as directed on instruction sheet  ____ Use inhalers on the day of surgery and bring to hospital day of surgery  ____ Stop metformin 2 days prior to surgery    ____ Take 1/2 of usual insulin dose the night before surgery and none on the morning of  surgery.   ____ Stop Coumadin/Plavix/aspirin on does not apply  __x__ Stop Anti-inflammatories  such as Advil, Aleve, Ibuprofen, Motrin, Naproxen, Naprosyn, Goodies powders or aspirin products. OK to take Tylenol.   ____ Stop supplements until after surgery.    ____ Bring C-Pap to the hospital.

## 2016-05-19 NOTE — Pre-Procedure Instructions (Addendum)
Unable to contact pt on phone, left message not to eat or drink after midnight and not to be sick for upcoming surgery.  Pt has called in for arrival time.  Requested pt bring in her meds to review day of surgery.

## 2016-05-19 NOTE — Telephone Encounter (Signed)
IMPRESSION: 1. Thickened heterogeneous endometrium with cystic spaces. Endometrial thickness is considered abnormal for an asymptomatic post-menopausal female. Endometrial sampling should be considered to exclude carcinoma. 2. Right ovarian simple appearing cyst measuring 2.3 cm. This is almost certainly benign, but follow up ultrasound is recommended in 1 year according to the Society of Radiologists in [place] Statement (D Clovis Riley et al. Management of Asymptomatic Ovarian and Other Adnexal Cysts Imaged at [place]: Society of Radiologists in [place] Statement 2010. Radiology 256 (Sept 2010): 943-954.). 3. Small left para ovarian cyst.   Electronically Signed   By: Nolon Nations M.D.   On: 05/19/2016 15:01

## 2016-05-23 ENCOUNTER — Ambulatory Visit: Payer: 59 | Admitting: Anesthesiology

## 2016-05-23 ENCOUNTER — Encounter
Admission: RE | Disposition: A | Discharge status: Home / Self Care | Payer: Self-pay | Source: Ambulatory Visit | Attending: Surgery

## 2016-05-23 ENCOUNTER — Ambulatory Visit: Payer: Commercial Managed Care - HMO | Admitting: Family Medicine

## 2016-05-23 ENCOUNTER — Ambulatory Visit
Admission: RE | Admit: 2016-05-23 | Discharge: 2016-05-23 | Disposition: A | Discharge status: Home / Self Care | Payer: 59 | Source: Ambulatory Visit | Attending: Surgery | Admitting: Surgery

## 2016-05-23 ENCOUNTER — Ambulatory Visit: Payer: 59

## 2016-05-23 ENCOUNTER — Encounter: Payer: Self-pay | Admitting: *Deleted

## 2016-05-23 ENCOUNTER — Other Ambulatory Visit: Payer: Commercial Managed Care - HMO

## 2016-05-23 ENCOUNTER — Telehealth: Payer: Self-pay | Admitting: *Deleted

## 2016-05-23 DIAGNOSIS — C50911 Malignant neoplasm of unspecified site of right female breast: Secondary | ICD-10-CM | POA: Insufficient documentation

## 2016-05-23 DIAGNOSIS — F419 Anxiety disorder, unspecified: Secondary | ICD-10-CM | POA: Diagnosis not present

## 2016-05-23 DIAGNOSIS — K219 Gastro-esophageal reflux disease without esophagitis: Secondary | ICD-10-CM | POA: Diagnosis not present

## 2016-05-23 DIAGNOSIS — Z452 Encounter for adjustment and management of vascular access device: Secondary | ICD-10-CM | POA: Diagnosis not present

## 2016-05-23 HISTORY — DX: Other specified postprocedural states: Z98.890

## 2016-05-23 HISTORY — DX: Other specified postprocedural states: R11.2

## 2016-05-23 HISTORY — PX: PORTACATH PLACEMENT: SHX2246

## 2016-05-23 HISTORY — DX: Malignant (primary) neoplasm, unspecified: C80.1

## 2016-05-23 SURGERY — INSERTION, TUNNELED CENTRAL VENOUS DEVICE, WITH PORT
Anesthesia: General

## 2016-05-23 MED ORDER — OXYCODONE HCL 5 MG PO TABS: 5.0000 mg | ORAL_TABLET | Freq: Once | ORAL | Status: DC | PRN

## 2016-05-23 MED ORDER — LACTATED RINGERS IV SOLN
INTRAVENOUS | Status: DC
  Administered 2016-05-23: 10:00:00 via INTRAVENOUS

## 2016-05-23 MED ORDER — PROPOFOL 500 MG/50ML IV EMUL
INTRAVENOUS | Status: AC
  Filled 2016-05-23: qty 50

## 2016-05-23 MED ORDER — LIDOCAINE HCL (PF) 1 % IJ SOLN
INTRAMUSCULAR | Status: DC | PRN
  Administered 2016-05-23: 1 mL
  Administered 2016-05-23: 7 mL

## 2016-05-23 MED ORDER — LIDOCAINE HCL (PF) 1 % IJ SOLN
INTRAMUSCULAR | Status: AC
  Filled 2016-05-23: qty 30

## 2016-05-23 MED ORDER — VANCOMYCIN HCL IN DEXTROSE 1-5 GM/200ML-% IV SOLN
INTRAVENOUS | Status: AC
  Filled 2016-05-23: qty 200

## 2016-05-23 MED ORDER — HYDROCODONE-ACETAMINOPHEN 5-325 MG PO TABS: 1.0000 | ORAL_TABLET | ORAL | 0 refills | Status: DC | PRN

## 2016-05-23 MED ORDER — FENTANYL CITRATE (PF) 100 MCG/2ML IJ SOLN
INTRAMUSCULAR | Status: AC
  Filled 2016-05-23: qty 2

## 2016-05-23 MED ORDER — PROPOFOL 10 MG/ML IV BOLUS
INTRAVENOUS | Status: DC | PRN
  Administered 2016-05-23: 80 mg via INTRAVENOUS

## 2016-05-23 MED ORDER — HYDROCODONE-ACETAMINOPHEN 5-325 MG PO TABS: 1.0000 | ORAL_TABLET | ORAL | Status: DC | PRN

## 2016-05-23 MED ORDER — OXYCODONE HCL 5 MG/5ML PO SOLN: 5.0000 mg | Freq: Once | ORAL | Status: DC | PRN

## 2016-05-23 MED ORDER — MIDAZOLAM HCL 2 MG/2ML IJ SOLN
INTRAMUSCULAR | Status: AC
  Filled 2016-05-23: qty 2

## 2016-05-23 MED ORDER — PROPOFOL 500 MG/50ML IV EMUL
INTRAVENOUS | Status: DC | PRN
  Administered 2016-05-23: 100 ug/kg/min via INTRAVENOUS

## 2016-05-23 MED ORDER — FENTANYL CITRATE (PF) 100 MCG/2ML IJ SOLN
25.0000 ug | INTRAMUSCULAR | Status: DC | PRN
  Administered 2016-05-23: 50 ug via INTRAVENOUS

## 2016-05-23 MED ORDER — HEPARIN SODIUM (PORCINE) 5000 UNIT/ML IJ SOLN
INTRAMUSCULAR | Status: AC
  Filled 2016-05-23: qty 1

## 2016-05-23 MED ORDER — SODIUM CHLORIDE 0.9 % IJ SOLN
INTRAMUSCULAR | Status: AC
  Filled 2016-05-23: qty 50

## 2016-05-23 MED ORDER — PANTOPRAZOLE SODIUM 40 MG PO TBEC
40.0000 mg | DELAYED_RELEASE_TABLET | Freq: Once | ORAL | Status: AC
  Administered 2016-05-23: 40 mg via ORAL
  Filled 2016-05-23: qty 1

## 2016-05-23 MED ORDER — VANCOMYCIN HCL IN DEXTROSE 1-5 GM/200ML-% IV SOLN
1000.0000 mg | Freq: Once | INTRAVENOUS | Status: AC
  Administered 2016-05-23: 1000 mg via INTRAVENOUS

## 2016-05-23 SURGICAL SUPPLY — 24 items
ADH SKN CLS APL DERMABOND .7 (GAUZE/BANDAGES/DRESSINGS) ×1
CANISTER SUCT 1200ML W/VALVE (MISCELLANEOUS) ×3 IMPLANT
CHLORAPREP W/TINT 26ML (MISCELLANEOUS) ×3 IMPLANT
COVER LIGHT HANDLE STERIS (MISCELLANEOUS) ×6 IMPLANT
DERMABOND ADVANCED (GAUZE/BANDAGES/DRESSINGS) ×2
DERMABOND ADVANCED .7 DNX12 (GAUZE/BANDAGES/DRESSINGS) ×1 IMPLANT
DRAPE C-ARM XRAY 36X54 (DRAPES) ×3 IMPLANT
ELECT REM PT RETURN 9FT ADLT (ELECTROSURGICAL) ×3
ELECTRODE REM PT RTRN 9FT ADLT (ELECTROSURGICAL) ×1 IMPLANT
GLOVE BIO SURGEON STRL SZ7.5 (GLOVE) ×3 IMPLANT
GOWN STRL REUS W/ TWL LRG LVL3 (GOWN DISPOSABLE) ×3 IMPLANT
GOWN STRL REUS W/TWL LRG LVL3 (GOWN DISPOSABLE) ×9
KIT PORT POWER 8FR ISP CVUE (Catheter) ×3 IMPLANT
KIT RM TURNOVER STRD PROC AR (KITS) ×3 IMPLANT
LABEL OR SOLS (LABEL) ×3 IMPLANT
NEEDLE FILTER BLUNT 18X 1/2SAF (NEEDLE) ×2
NEEDLE FILTER BLUNT 18X1 1/2 (NEEDLE) ×1 IMPLANT
PACK PORT-A-CATH (MISCELLANEOUS) ×3 IMPLANT
SUT MNCRL+ 5-0 UNDYED PC-3 (SUTURE) ×1 IMPLANT
SUT MONOCRYL 5-0 (SUTURE) ×2
SUT SILK 4 0 SH (SUTURE) ×3 IMPLANT
SUT VIC AB 5-0 RB1 27 (SUTURE) ×3 IMPLANT
SYR 3ML LL SCALE MARK (SYRINGE) ×3 IMPLANT
SYRINGE 10CC LL (SYRINGE) ×3 IMPLANT

## 2016-05-23 NOTE — Anesthesia Preprocedure Evaluation (Addendum)
Anesthesia Evaluation  Patient identified by MRN, date of birth, ID band Patient awake    Reviewed: Allergy & Precautions, H&P , NPO status , Patient's Chart, lab work & pertinent test results  History of Anesthesia Complications (+) PONV and history of anesthetic complications  Airway Mallampati: III  TM Distance: >3 FB Neck ROM: full    Dental  (+) Poor Dentition, Chipped   Pulmonary neg pulmonary ROS, neg shortness of breath,    Pulmonary exam normal breath sounds clear to auscultation       Cardiovascular Exercise Tolerance: Good (-) angina(-) Past MI and (-) DOE negative cardio ROS Normal cardiovascular exam Rhythm:regular Rate:Normal     Neuro/Psych PSYCHIATRIC DISORDERS Anxiety negative neurological ROS     GI/Hepatic negative GI ROS, Neg liver ROS, neg GERD  ,  Endo/Other  negative endocrine ROS  Renal/GU negative Renal ROS  negative genitourinary   Musculoskeletal   Abdominal   Peds  Hematology negative hematology ROS (+)   Anesthesia Other Findings Past Medical History: No date: Anxiety 04/24/2016: Cancer ([place])     Comment: right breast No date: HSV infection     Comment: HISTORY OF HSV No date: PONV (postoperative nausea and vomiting)     Comment: nausea no vomiting 10/20/2005: Post-menopausal No date: Postmenopause bleeding  Past Surgical History: No date: ANAL FISTULECTOMY No date: ENDOMETRIAL BIOPSY No date: KNEE SURGERY     Comment: snow skating     Reproductive/Obstetrics negative OB ROS                            Anesthesia Physical Anesthesia Plan  ASA: III  Anesthesia Plan: General   Post-op Pain Management:    Induction:   Airway Management Planned:   Additional Equipment:   Intra-op Plan:   Post-operative Plan:   Informed Consent: I have reviewed the patients History and Physical, chart, labs and discussed the procedure including the  risks, benefits and alternatives for the proposed anesthesia with the patient or authorized representative who has indicated his/her understanding and acceptance.   Dental Advisory Given  Plan Discussed with: Anesthesiologist, CRNA and Surgeon  Anesthesia Plan Comments:         Anesthesia Quick Evaluation

## 2016-05-23 NOTE — Transfer of Care (Signed)
Immediate Anesthesia Transfer of Care Note  Patient: Kimberly Clarke  Procedure(s) Performed: Procedure(s): INSERTION PORT-A-CATH (N/A)  Patient Location: PACU  Anesthesia Type:General  Level of Consciousness: awake, alert  and oriented  Airway & Oxygen Therapy: Patient Spontanous Breathing  Post-op Assessment: Report given to RN and Post -op Vital signs reviewed and stable  Post vital signs: Reviewed and stable  Last Vitals:  Vitals:   05/23/16 1006 05/23/16 1207  BP: (!) 127/96 106/77  Pulse: 90 84  Resp: 16 16  Temp: (!) 36 C 36.7 C    Last Pain:  Vitals:   05/23/16 1006  TempSrc: Tympanic         Complications: No apparent anesthesia complications

## 2016-05-23 NOTE — H&P (Signed)
  She was recently evaluated in the office with recent findings of cancer of the right breast. Her oncologist has recommended preoperative chemotherapy. She is coming today to have insertion of a left-sided central venous catheter with subcutaneous infusion port.  She reports no change in condition since the day of the office exam.  She had called [facility] metabolic panel on [id number] which was normal. CBC was normal.  The left subclavian area appears typical.  I discussed the plan for insertion of central venous catheter with subcutaneous infusion port. Discussed operation care risk and benefits.

## 2016-05-23 NOTE — Telephone Encounter (Signed)
Met patient's [name] today while patient was in recovery. She had Kimberly port a cath placed today.  I had tried to reach the patient this morning to inform Kimberly of Kimberly appointment for gyn oncology tomorrow at 3:00.  Kimberly Clarke states she will bring Kimberly Clarke to Kimberly appointment tomorrow.  Informed Kimberly Clarke and the patient to call me with any questions.

## 2016-05-23 NOTE — Telephone Encounter (Signed)
Message sent to Kristi to get pt added to GYN schedule for Wednesday.

## 2016-05-23 NOTE — Anesthesia Postprocedure Evaluation (Signed)
Anesthesia Post Note  Patient: Kimberly Clarke  Procedure(s) Performed: Procedure(s) (LRB): INSERTION PORT-A-CATH (N/A)  Patient location during evaluation: PACU Anesthesia Type: General Level of consciousness: awake and alert Pain management: pain level controlled Vital Signs Assessment: post-procedure vital signs reviewed and stable Respiratory status: spontaneous breathing, nonlabored ventilation, respiratory function stable and patient connected to nasal cannula oxygen Cardiovascular status: blood pressure returned to baseline and stable Postop Assessment: no signs of nausea or vomiting Anesthetic complications: no     Last Vitals:  Vitals:   05/23/16 1253 05/23/16 1304  BP: (!) 130/95 135/79  Pulse: (!) 59 67  Resp: 16 16  Temp: 36.4 C 36.6 C    Last Pain:  Vitals:   05/23/16 1304  TempSrc: Temporal  PainSc: 2                  Precious Haws Piscitello

## 2016-05-23 NOTE — Discharge Instructions (Signed)
AMBULATORY SURGERY  DISCHARGE INSTRUCTIONS   1) The drugs that you were given will stay in your system until tomorrow so for the next 24 hours you should not:  A) Drive an automobile B) Make any legal decisions C) Drink any alcoholic beverage   2) You may resume regular meals tomorrow.  Today it is better to start with liquids and gradually work up to solid foods.  You may eat anything you prefer, but it is better to start with liquids, then soup and crackers, and gradually work up to solid foods.   3) Please notify your doctor immediately if you have any unusual bleeding, trouble breathing, redness and pain at the surgery site, drainage, fever, or pain not relieved by medication.    4) Additional Instructions:        Please contact your physician with any problems or Same Day Surgery at [phone], Monday through Friday 6 am to 4 pm, or [place] at [facility] number at [phone].Take Tylenol or Norco if needed for pain.  Should not drive or do anything dangerous when taking Norco.  Gradually resume usual activities as tolerated.  May apply EMLA cream as discussed.  May shower.

## 2016-05-23 NOTE — Op Note (Signed)
OPERATIVE REPORT  PREOP DIAGNOSIS: Breast cancer  POSTOP DIAGNOSIS: Breast cancer  PROCEDURE: Insertion of central venous catheter with subcutaneous infusion port.  ANESTHESIA: Local and monitored anesthesia care  SURGEON: Rochel Brome M.D.  INDICATION: She had recent findings of right breast cancer now needing central venous access for preoperative chemotherapy.  With the patient on the operating table in the supine position she was monitored by the anesthesia staff and sedated. The neck and chest wall were prepared with ChloraPrep and draped in a sterile manner.  The skin beneath the left clavicle was infiltrated with 1% Xylocaine. A transversely oriented 3 cm incision was made and carried down through subcutaneous tissues. Several small bleeding points were cauterized. A subcutaneous pouch was created large enough to admit the ClearView port. The jugular vein was identified with ultrasound. The skin overlying the jugular vein was infiltrated with 1% Xylocaine. A transversely oriented 5 mm incision was made and carried down through subcutaneous tissues. A needle was inserted into the jugular vein using ultrasound guidance. An image was saved for the paper chart. A guidewire was advanced down through the needle into the central circulation. Fluoroscopy was used to demonstrate the location of the guidewire in the vena cava. The dilator and introducer sheath were advanced over the guidewire. The guidewire and dilator were removed. The catheter was placed down through the sheath and the sheath was peeled away. Fluoroscopy was used to demonstrate the tip of the catheter in the superior vena cava. An image was saved for the paper chart. The catheter was tunneled to the subclavian incision and pressure held over the tunnel site. The catheter was cut to fit and attached to the ClearView port and accessed with a Huber needle aspirating a trace of blood and flushing with 5 cc of heparinized saline solution.  The port was placed into the subcutaneous pouch and sutured to the deep fascia with 4-0 silk. Hemostasis was intact. Subcutaneous tissues were approximated with 5-0 Vicryl. Both skin incisions were closed with 5-0 Monocryl subcuticular suture and Dermabond. The patient tolerated surgery satisfactorily and was prepared for transfer to the recovery room.

## 2016-05-23 NOTE — Telephone Encounter (Signed)
GynOnc can see her 1/3 at 1500.

## 2016-05-24 ENCOUNTER — Other Ambulatory Visit: Payer: Self-pay | Admitting: Oncology

## 2016-05-24 ENCOUNTER — Inpatient Hospital Stay: Payer: 59 | Attending: Obstetrics and Gynecology | Admitting: Obstetrics and Gynecology

## 2016-05-24 ENCOUNTER — Encounter: Payer: Self-pay | Admitting: Surgery

## 2016-05-24 VITALS — BP 129/92 | HR 76 | Temp 98.1°F | Ht 69.0 in | Wt 160.5 lb

## 2016-05-24 DIAGNOSIS — N83201 Unspecified ovarian cyst, right side: Secondary | ICD-10-CM

## 2016-05-24 DIAGNOSIS — R918 Other nonspecific abnormal finding of lung field: Secondary | ICD-10-CM | POA: Insufficient documentation

## 2016-05-24 DIAGNOSIS — F419 Anxiety disorder, unspecified: Secondary | ICD-10-CM | POA: Insufficient documentation

## 2016-05-24 DIAGNOSIS — Z7689 Persons encountering health services in other specified circumstances: Secondary | ICD-10-CM | POA: Insufficient documentation

## 2016-05-24 DIAGNOSIS — R5383 Other fatigue: Secondary | ICD-10-CM | POA: Insufficient documentation

## 2016-05-24 DIAGNOSIS — G4700 Insomnia, unspecified: Secondary | ICD-10-CM | POA: Insufficient documentation

## 2016-05-24 DIAGNOSIS — Z79899 Other long term (current) drug therapy: Secondary | ICD-10-CM | POA: Insufficient documentation

## 2016-05-24 DIAGNOSIS — Z780 Asymptomatic menopausal state: Secondary | ICD-10-CM | POA: Diagnosis not present

## 2016-05-24 DIAGNOSIS — Z5111 Encounter for antineoplastic chemotherapy: Secondary | ICD-10-CM | POA: Insufficient documentation

## 2016-05-24 DIAGNOSIS — R938 Abnormal findings on diagnostic imaging of other specified body structures: Secondary | ICD-10-CM | POA: Diagnosis not present

## 2016-05-24 DIAGNOSIS — R51 Headache: Secondary | ICD-10-CM | POA: Insufficient documentation

## 2016-05-24 DIAGNOSIS — N83202 Unspecified ovarian cyst, left side: Secondary | ICD-10-CM | POA: Insufficient documentation

## 2016-05-24 DIAGNOSIS — B009 Herpesviral infection, unspecified: Secondary | ICD-10-CM | POA: Insufficient documentation

## 2016-05-24 DIAGNOSIS — R599 Enlarged lymph nodes, unspecified: Secondary | ICD-10-CM | POA: Insufficient documentation

## 2016-05-24 DIAGNOSIS — Z809 Family history of malignant neoplasm, unspecified: Secondary | ICD-10-CM | POA: Insufficient documentation

## 2016-05-24 DIAGNOSIS — Z8619 Personal history of other infectious and parasitic diseases: Secondary | ICD-10-CM | POA: Insufficient documentation

## 2016-05-24 DIAGNOSIS — Z170 Estrogen receptor positive status [ER+]: Secondary | ICD-10-CM | POA: Insufficient documentation

## 2016-05-24 DIAGNOSIS — R9389 Abnormal findings on diagnostic imaging of other specified body structures: Secondary | ICD-10-CM

## 2016-05-24 DIAGNOSIS — K5900 Constipation, unspecified: Secondary | ICD-10-CM | POA: Insufficient documentation

## 2016-05-24 DIAGNOSIS — R531 Weakness: Secondary | ICD-10-CM | POA: Insufficient documentation

## 2016-05-24 DIAGNOSIS — C50411 Malignant neoplasm of upper-outer quadrant of right female breast: Secondary | ICD-10-CM

## 2016-05-24 DIAGNOSIS — Z803 Family history of malignant neoplasm of breast: Secondary | ICD-10-CM | POA: Insufficient documentation

## 2016-05-24 DIAGNOSIS — F329 Major depressive disorder, single episode, unspecified: Secondary | ICD-10-CM | POA: Insufficient documentation

## 2016-05-24 NOTE — Progress Notes (Signed)
Patient here for initial visit. Very anxious, regarding diagnosis.treatment and diagnosis.

## 2016-05-24 NOTE — Patient Instructions (Signed)

## 2016-05-24 NOTE — Progress Notes (Signed)
  Oncology Nurse Navigator Documentation Chaperoned pelvic exam. Endometrial biopsy obtained and sent to pathology. Educated on endometrial biopsy after care. Navigator Location: [facility] (05/24/16 1600)   )Navigator Encounter Type: Initial GynOnc (05/24/16 1600)   Abnormal Finding Date: 05/08/16 (05/24/16 1600)                 Patient Visit Type: GynOnc;Initial (05/24/16 1600) Treatment Phase: Abnormal Scans (05/24/16 1600) Barriers/Navigation Needs: Education (05/24/16 1600)         Education Method: Verbal;Written (05/24/16 1600)      Acuity: Level 2 (05/24/16 1600)   Acuity Level 2: Educational needs;Ongoing guidance and education throughout treatment as needed;Initial guidance, education and coordination as needed (05/24/16 1600)     Time Spent with Patient: 30 (05/24/16 1600)

## 2016-05-24 NOTE — Progress Notes (Signed)
Gynecologic Oncology Consult Visit   Referring Provider: Dr Grayland Ormond  Chief Concern: Thickened endometrium detected on staging CT scan for breast cancer.   Subjective:  Kimberly Clarke is a 61 y.o. G57 female who is seen in consultation for thickened endometrium. Diagnosed with right breast cancer and staging CT 12/18 showed thickened endometrium up to 1.4 cm and no other abnormalities except the right breast cancer.   Pelvic US 12/19 IMPRESSION: 1. Thickened heterogeneous endometrium with cystic spaces.Thickness: 13 mm.  Heterogeneous with cystic spaces and vascularity. Endometrial thickness is considered abnormal for an asymptomatic post-menopausal female. Endometrial sampling should be considered to exclude carcinoma. 2. Right ovarian simple appearing cyst measuring 2.3 cm. This is almost certainly benign, but follow up ultrasound is recommended in 1 year according to the Society of Radiologists in Ultrasound 2010 Consensus Conference Statement (D Clovis Riley et al.   3. Small left para ovarian cyst.  She is not having any vaginal bleeding and underwent menopause around age 57 with no hormone replacement.  Had a benign cervical polyp removed at another [facility] 4 years ago.  PAP in 11/16 normal.    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  Problem List: Patient Active Problem List   Diagnosis Date Noted  . Breast mass in female 04/26/2016  . Primary cancer of upper outer quadrant of right female breast ([place]) 04/26/2016  . HSV-2 (herpes simplex virus 2) infection 03/23/2015  . Insomnia 03/23/2015  . Anxiety 03/04/2012    Past Medical History: Past Medical History:  Diagnosis Date  . Anxiety   . Cancer ([place]) 04/24/2016   right breast  . HSV infection    HISTORY OF HSV  . PONV (postoperative nausea and vomiting)    nausea no vomiting  . Post-menopausal 10/20/2005  . Postmenopause bleeding     Past Surgical History: Past Surgical History:   Procedure Laterality Date  . ANAL FISTULECTOMY    . ENDOMETRIAL BIOPSY    . KNEE SURGERY     snow skating  . PORTACATH PLACEMENT N/A 05/23/2016   Procedure: INSERTION PORT-A-CATH;  Surgeon: Leonie Green, MD;  Location: [facility] ORS;  Service: General;  Laterality: N/A;    OB History:  OB History  Gravida Para Term Preterm AB Living  0 0 0 0 0 0  SAB TAB Ectopic Multiple Live Births  0 0 0 0          Family History: Family History  Problem Relation Age of Onset  . Cancer Mother     BREAST AND BONES  . Breast cancer Mother 103  . Emphysema Father   . Cancer Father   . Cancer Maternal Aunt     Social History: Social History   Social History  . Marital status: Widowed    Spouse name: N/A  . Number of children: N/A  . Years of education: N/A   Occupational History  . Not on file.   Social History Main Topics  . Smoking status: Never Smoker  . Smokeless tobacco: Never Used  . Alcohol use 3.6 - 4.8 oz/week    3 - 4 Glasses of wine, 3 - 4 Cans of beer per week  . Drug use: No     Comment: In early 72  . Sexual activity: Yes    Partners: Male    Birth control/ protection: Post-menopausal   Other Topics Concern  . Not on file   Social History Narrative  . No narrative on  file    Allergies: Allergies  Allergen Reactions  . Penicillins Anaphylaxis    unknown    Current Medications: Current Outpatient Prescriptions  Medication Sig Dispense Refill  . ALPRAZolam (XANAX) 0.25 MG tablet Take 1 tablet (0.25 mg total) by mouth at bedtime. (Patient taking differently: Take 0.25 mg by mouth at bedtime as needed. ) 30 tablet 0  . citalopram (CELEXA) 20 MG tablet Take 20 mg by mouth daily.     Marland Kitchen HYDROcodone-acetaminophen (NORCO) 5-325 MG tablet Take 1 tablet by mouth every 4 (four) hours as needed for moderate pain. 4 tablet 0  . lidocaine-prilocaine (EMLA) cream Apply to affected area once 30 g 3  . ondansetron (ZOFRAN) 8 MG tablet Take 1 tablet (8 mg total) by  mouth 2 (two) times daily as needed. Start on the third day after chemotherapy. 30 tablet 1  . prochlorperazine (COMPAZINE) 10 MG tablet Take 1 tablet (10 mg total) by mouth every 6 (six) hours as needed (Nausea or vomiting). 30 tablet 1   No current facility-administered medications for this visit.     Review of Systems General: negative for, fevers, chills, fatigue, changes in sleep, changes in weight or appetite Skin: negative for changes in color, texture, moles or lesions Eyes: negative for, changes in vision, pain, diplopia HEENT: negative for, change in hearing, pain, discharge, tinnitus, vertigo, voice changes, sore throat, neck masses Pulmonary: negative for, dyspnea, orthopnea, productive cough Cardiac: negative for, palpitations, syncope, pain, discomfort, pressure Gastrointestinal: negative for, dysphagia, nausea, vomiting, jaundice, pain, constipation, diarrhea, hematemesis, hematochezia Genitourinary/Sexual: negative for, dysuria, discharge, hesitancy, nocturia, retention, stones, infections, STD's, incontinence Ob/Gyn: negative for, irregular bleeding, pain Musculoskeletal: negative for, pain, stiffness, swelling, range of motion limitation Hematology: No coagulation disorder, negative for, easy bruising, bleeding Neurologic/Psych: negative for, headaches, seizures, paralysis, weakness, tremor, change in gait, change in sensation, mood swings, depression, anxiety, change in memory  Objective:  Physical Examination:  BP (!) 129/92 (BP Location: Left Arm, Patient Position: Sitting)   Pulse 76   Temp 98.1 F (36.7 C) (Tympanic)   Ht 5\' 9"  (1.753 m)   Wt 160 lb 8 oz (72.8 kg)   BMI 23.70 kg/m    ECOG Performance Status: 0 - Asymptomatic  General appearance: alert, cooperative and appears stated age HEENT:PERRLA, neck supple with midline trachea and thyroid without masses Lymph node survey: non-palpable, axillary, inguinal, supraclavicular Cardiovascular: regular rate  and rhythm, no murmurs or gallops Respiratory: normal air entry, lungs clear to auscultation and no rales, rhonchi or wheezing Breast exam: not examined. Abdomen: soft, non-tender, without masses or organomegaly, no hernias and well healed incision Back: inspection of back is normal Extremities: extremities normal, atraumatic, no cyanosis or edema Skin exam - normal coloration and turgor, no rashes, no suspicious skin lesions noted. Neurological exam reveals alert, oriented, normal speech, no focal findings or movement disorder noted.  Pelvic: exam chaperoned by nurse;  Vulva: normal appearing vulva with no masses, tenderness or lesions; Vagina: normal vagina; Adnexa: normal adnexa in size, nontender and no masses; Uterus: uterus is normal size, shape, consistency and nontender; Cervix: anteverted; Rectal: normal rectal, no masses  Endometrial biopsy done:  Informed consent signed.  Cervix grasped with tenaculum and Pipelle easily passed into endometrial cavity twice with aspiration and minimal tissue obtained.  Sounded to 6 cm.    Assessment:  Kimberly Clarke is a 61 y.o. G0 post menopausal female diagnosed with thickened endometrium on staging CT scan for breast cancer.  She is asymptomatic without  any bleeding and does not take hormone replacement . This is an incidental finding and there is no obvious predisposing risk factor.  Possible it could represent an endometrial polyp, but seems unlikely based on the imaging.  Also has simple 2.3 cm right ovarian cyst.    Medical co-morbidities complicating care: breast cancer, anxiety.  Plan:   Problem List Items Addressed This Visit    None    Visit Diagnoses    Thickened endometrium    -  Primary     Patient will start chemotherapy with [facility] tomorrow for breast cancer.  We will contact the patient when the endometrial biopsy results next week.  Expect this will be benign in view of lack of bleeding and small amount of tissue obtained on  biopsy.  If so we can see her back with repeat pelvic US in 6 months.  If biopsy is abnormal and shows hyperplasia or cancer or if she develops bleeding will plan for further evaluation and/or treatment.    The patient's diagnosis, an outline of the further diagnostic and laboratory studies which will be required, the recommendation, and alternatives were discussed.  All questions were answered to the patient's satisfaction.  A total of 40 minutes were spent with the patient/family today; 33% was spent in education, counseling and coordination of care for thickened endometrium.    Mellody Drown, MD  CC:  Meliton Rattan, MD [place], [place] [address] [phone]

## 2016-05-24 NOTE — Progress Notes (Signed)
[facility]  [phone]) [phone] [phone]) [phone]  ID: Kimberly Clarke OB: May 13, 1956  MR#: [id number]  JHE#:[id number]  [name], MD as PCP - General (Family Medicine)  CHIEF COMPLAINT: Clinical stage IIa ER/PR positive, HER-2 negative invasive carcinoma of the upper outer quadrant of the right breast.  INTERVAL HISTORY: Patient returns to clinic today for further evaluation and initiation of neoadjuvant chemotherapy using Adriamycin, Cytoxan, and Taxol. She continues to be highly anxious, but otherwise feels well. She has no neurologic complaints. She denies any recent fevers or illnesses. She has good appetite and denies weight loss. She has no chest pain or shortness of breath. She denies any nausea, vomiting, constipation, or diarrhea. She has no urinary complaints. Patient offers no further specific complaints today.   REVIEW OF SYSTEMS:   Review of Systems  Constitutional: Negative.  Negative for fever, malaise/fatigue and weight loss.  Respiratory: Negative.  Negative for cough.   Cardiovascular: Negative for chest pain and leg swelling.  Gastrointestinal: Negative.  Negative for abdominal pain.  Genitourinary: Negative.   Musculoskeletal: Negative.   Neurological: Negative.   Psychiatric/Behavioral: Positive for depression. The patient is nervous/anxious.     As per HPI. Otherwise, a complete review of systems is negative.  PAST MEDICAL HISTORY: Past Medical History:  Diagnosis Date  . Anxiety   . Cancer ([place]) 04/24/2016   right breast  . HSV infection    HISTORY OF HSV  . PONV (postoperative nausea and vomiting)    nausea no vomiting  . Post-menopausal 10/20/2005  . Postmenopause bleeding     PAST SURGICAL HISTORY: Past Surgical History:  Procedure Laterality Date  . ANAL FISTULECTOMY    . ENDOMETRIAL BIOPSY    . KNEE SURGERY     snow skating  . PORTACATH PLACEMENT N/A 05/23/2016   Procedure: INSERTION  PORT-A-CATH;  Surgeon: Leonie Green, MD;  Location: [facility] ORS;  Service: General;  Laterality: N/A;    FAMILY HISTORY: Family History  Problem Relation Age of Onset  . Cancer Mother     BREAST AND BONES  . Breast cancer Mother 29  . Emphysema Father   . Cancer Father   . Cancer Maternal Aunt     ADVANCED DIRECTIVES (Y/N):  N  HEALTH MAINTENANCE: Social History  Substance Use Topics  . Smoking status: Never Smoker  . Smokeless tobacco: Never Used  . Alcohol use 3.6 - 4.8 oz/week    3 - 4 Glasses of wine, 3 - 4 Cans of beer per week     Colonoscopy:  PAP:  Bone density:  Lipid panel:  Allergies  Allergen Reactions  . Penicillins Anaphylaxis    unknown    Current Outpatient Prescriptions  Medication Sig Dispense Refill  . ALPRAZolam (XANAX) 0.25 MG tablet Take 1 tablet (0.25 mg total) by mouth at bedtime. (Patient taking differently: Take 0.25 mg by mouth at bedtime as needed. ) 30 tablet 0  . citalopram (CELEXA) 20 MG tablet Take 20 mg by mouth daily.     Marland Kitchen HYDROcodone-acetaminophen (NORCO) 5-325 MG tablet Take 1 tablet by mouth every 4 (four) hours as needed for moderate pain. 4 tablet 0  . lidocaine-prilocaine (EMLA) cream Apply to affected area once 30 g 3  . ondansetron (ZOFRAN) 8 MG tablet Take 1 tablet (8 mg total) by mouth 2 (two) times daily as needed. Start on the third day after chemotherapy. 30 tablet 1  . prochlorperazine (COMPAZINE) 10 MG tablet Take 1 tablet (  10 mg total) by mouth every 6 (six) hours as needed (Nausea or vomiting). 30 tablet 1   No current facility-administered medications for this visit.    Facility-Administered Medications Ordered in Other Visits  Medication Dose Route Frequency Provider Last Rate Last Dose  . 0.9 %  sodium chloride infusion   Intravenous Once Lloyd Huger, MD      . cyclophosphamide (CYTOXAN) 1,120 mg in sodium chloride 0.9 % 250 mL chemo infusion  600 mg/m2 (Treatment Plan Recorded) Intravenous Once  Lloyd Huger, MD      . DOXOrubicin (ADRIAMYCIN) chemo injection 112 mg  60 mg/m2 (Treatment Plan Recorded) Intravenous Once Lloyd Huger, MD      . fosaprepitant (EMEND) 150 mg, dexamethasone (DECADRON) 12 mg in sodium chloride 0.9 % 145 mL IVPB   Intravenous Once Lloyd Huger, MD      . heparin lock flush 100 unit/mL  500 Units Intravenous Once Lloyd Huger, MD      . palonosetron (ALOXI) injection 0.25 mg  0.25 mg Intravenous Once Lloyd Huger, MD      . pegfilgrastim (NEULASTA ONPRO KIT) injection 6 mg  6 mg Subcutaneous Once Lloyd Huger, MD      . sodium chloride flush (NS) 0.9 % injection 10 mL  10 mL Intravenous PRN Lloyd Huger, MD   10 mL at 05/25/16 0941    OBJECTIVE: Vitals:   05/25/16 1010  BP: 134/88  Pulse: 83     Body mass index is 24.33 kg/m.    ECOG FS:0 - Asymptomatic  General: Well-developed, well-nourished, no acute distress. Eyes: Pink conjunctiva, anicteric sclera. Breasts: Patient requested exam be deferred today. Lungs: Clear to auscultation bilaterally. Heart: Regular rate and rhythm. No rubs, murmurs, or gallops. Abdomen: Soft, nontender, nondistended. No organomegaly noted, normoactive bowel sounds. Musculoskeletal: No edema, cyanosis, or clubbing. Neuro: Alert, answering all questions appropriately. Cranial nerves grossly intact. Skin: No rashes or petechiae noted. Psych: Normal affect.   LAB RESULTS:  Lab Results  Component Value Date   NA 137 05/25/2016   K 3.9 05/25/2016   CL 106 05/25/2016   CO2 26 05/25/2016   GLUCOSE 85 05/25/2016   BUN 18 05/25/2016   CREATININE 0.72 05/25/2016   CALCIUM 9.2 05/25/2016   PROT 7.4 05/25/2016   ALBUMIN 4.0 05/25/2016   AST 26 05/25/2016   ALT 23 05/25/2016   ALKPHOS 85 05/25/2016   BILITOT 0.5 05/25/2016   GFRNONAA >60 05/25/2016   GFRAA >60 05/25/2016    Lab Results  Component Value Date   WBC 6.9 05/25/2016   NEUTROABS 4.6 05/25/2016   HGB 13.7  05/25/2016   HCT 40.8 05/25/2016   MCV 89.0 05/25/2016   PLT 300 05/25/2016     STUDIES: Ct Chest W Contrast  Result Date: 05/08/2016 CLINICAL DATA:  New diagnosis of breast cancer. EXAM: CT CHEST, ABDOMEN, AND PELVIS WITH CONTRAST TECHNIQUE: Multidetector CT imaging of the chest, abdomen and pelvis was performed following the standard protocol during bolus administration of intravenous contrast. CONTRAST:  168m ISOVUE-300 IOPAMIDOL (ISOVUE-300) INJECTION 61% COMPARISON:  None. FINDINGS: CT CHEST FINDINGS Cardiovascular: No significant vascular findings. Normal heart size. No pericardial effusion. Mediastinum/Nodes: No enlarged mediastinal, hilar, or axillary lymph nodes. Thyroid gland, trachea, and esophagus demonstrate no significant findings. There is a right retropectoral lymph node which measures 5 mm 6 mm, image 18 of series 2. Adjacent lymph node measures 8 mm, image 16 of series 2. No axillary or supraclavicular  adenopathy. Lungs/Pleura: Lungs are clear. No pleural effusion or pneumothorax.Tiny nodule in the right upper lobe measures 2 mm, image 63 of series 4. Subpleural nodule in the right middle lobe measures 3 mm, image 99 of series 4. In the left lower lobe there is a parenchymal nodule measuring 3 mm, image 96 of series 4. Musculoskeletal: No suspicious bone lesions identified. Right breast lesion measures 1.7 cm, image 33 of series 2. CT ABDOMEN PELVIS FINDINGS Hepatobiliary: No focal liver abnormality is seen. No gallstones, gallbladder wall thickening, or biliary dilatation. Pancreas: Unremarkable. No pancreatic ductal dilatation or surrounding inflammatory changes. Spleen: Normal in size without focal abnormality. Adrenals/Urinary Tract: Adrenal glands are unremarkable. No mass or hydronephrosis. Within the midpole of left kidney there is a 4 mm low-attenuation structure which is too small to reliably characterize. Bladder is unremarkable. Stomach/Bowel: The stomach is normal. The small  bowel loops have a normal course and caliber. No bowel obstruction. Vascular/Lymphatic: Normal appearance of the abdominal aorta. No adenopathy within the upper abdomen or pelvis. Reproductive: The endometrium measures up to 1.4 cm, image 92 of series 7. No adnexal mass. Other: No abdominal wall hernia or abnormality. No abdominopelvic ascites. Musculoskeletal: No aggressive lytic or sclerotic bone lesions. IMPRESSION: 1. Right breast lesion identified compatible with the clinical history of new diagnosis of breast cancer. 2. There are several prominent, non pathologically enlarged right retropectoral lymph nodes. 3. There are a few small, nonspecific pulmonary nodules identified in both lungs measuring up to 3 mm. Attention on follow-up imaging is advised. 4. The endometrium appears to measure up to 1.4 cm. Endometrial thickness is considered abnormal for an asymptomatic post-menopausal female. Recommend further evaluation with pelvic sonogram for more definitive assessment of the endometrium prior to endometrial sampling. Electronically Signed   By: Kerby Moors M.D.   On: 05/08/2016 16:37   Nm Cardiac Muga Rest  Result Date: 05/08/2016 CLINICAL DATA:  Breast cancer. Evaluate cardiac function in relation to chemotherapy. EXAM: NUCLEAR MEDICINE CARDIAC BLOOD POOL IMAGING (MUGA) TECHNIQUE: Cardiac multi-gated acquisition was performed at rest following intravenous injection of Tc-62mlabeled red blood cells. RADIOPHARMACEUTICALS:  22 mCi Tc-972mDP in-vitro labeled red blood cells IV COMPARISON:  None. FINDINGS: No  focal wall motion abnormality of the left ventricle. Calculated left ventricular ejection fraction equals 74% IMPRESSION: Left ventricular ejection fraction equals74 %. Electronically Signed   By: StSuzy Bouchard.D.   On: 05/08/2016 08:10   UsKorearansvaginal Non-ob  Result Date: 05/19/2016 CLINICAL DATA:  Thickened endometrium on CT exam.  Postmenopausal. EXAM: TRANSABDOMINAL AND  TRANSVAGINAL ULTRASOUND OF PELVIS TECHNIQUE: Both transabdominal and transvaginal ultrasound examinations of the pelvis were performed. Transabdominal technique was performed for global imaging of the pelvis including uterus, ovaries, adnexal regions, and pelvic cul-de-sac. It was necessary to proceed with endovaginal exam following the transabdominal exam to visualize the endometrium and left ovary. COMPARISON:  05/08/2016 CT FINDINGS: Uterus Measurements: 6.2 x 1.9 x 2.8 cm. No fibroids or other mass visualized. Endometrium Thickness: 13 mm.  Heterogeneous with cystic spaces and vascularity. Right ovary Measurements: 2.2 x 2.0 x 3.0 cm. Anechoic simple cyst is 2.3 x 1.5 x 2.0 cm Left ovary Measurements: 1.8 x 0.8 x 2.4 cm. Small para ovarian cyst is 0.7 x 0.5 x 0.7 cm. Other findings No abnormal free fluid. IMPRESSION: 1. Thickened heterogeneous endometrium with cystic spaces. Endometrial thickness is considered abnormal for an asymptomatic post-menopausal female. Endometrial sampling should be considered to exclude carcinoma. 2. Right ovarian simple appearing cyst measuring  2.3 cm. This is almost certainly benign, but follow up ultrasound is recommended in 1 year according to the Society of Radiologists in [place] Statement (D Clovis Riley et al. Management of Asymptomatic Ovarian and Other Adnexal Cysts Imaged at [place]: Society of Radiologists in [place] Statement 2010. Radiology 256 (Sept 2010): 943-954.). 3. Small left para ovarian cyst. Electronically Signed   By: Nolon Nations M.D.   On: 05/19/2016 15:01   US Pelvis Complete  Result Date: 05/19/2016 CLINICAL DATA:  Thickened endometrium on CT exam.  Postmenopausal. EXAM: TRANSABDOMINAL AND TRANSVAGINAL ULTRASOUND OF PELVIS TECHNIQUE: Both transabdominal and transvaginal ultrasound examinations of the pelvis were performed. Transabdominal technique was performed for global imaging of the pelvis including  uterus, ovaries, adnexal regions, and pelvic cul-de-sac. It was necessary to proceed with endovaginal exam following the transabdominal exam to visualize the endometrium and left ovary. COMPARISON:  05/08/2016 CT FINDINGS: Uterus Measurements: 6.2 x 1.9 x 2.8 cm. No fibroids or other mass visualized. Endometrium Thickness: 13 mm.  Heterogeneous with cystic spaces and vascularity. Right ovary Measurements: 2.2 x 2.0 x 3.0 cm. Anechoic simple cyst is 2.3 x 1.5 x 2.0 cm Left ovary Measurements: 1.8 x 0.8 x 2.4 cm. Small para ovarian cyst is 0.7 x 0.5 x 0.7 cm. Other findings No abnormal free fluid. IMPRESSION: 1. Thickened heterogeneous endometrium with cystic spaces. Endometrial thickness is considered abnormal for an asymptomatic post-menopausal female. Endometrial sampling should be considered to exclude carcinoma. 2. Right ovarian simple appearing cyst measuring 2.3 cm. This is almost certainly benign, but follow up ultrasound is recommended in 1 year according to the Society of Radiologists in [place] Statement (D Clovis Riley et al. Management of Asymptomatic Ovarian and Other Adnexal Cysts Imaged at [place]: Society of Radiologists in [place] Statement 2010. Radiology 256 (Sept 2010): 943-954.). 3. Small left para ovarian cyst. Electronically Signed   By: Nolon Nations M.D.   On: 05/19/2016 15:01   Ct Abdomen Pelvis W Contrast  Result Date: 05/08/2016 CLINICAL DATA:  New diagnosis of breast cancer. EXAM: CT CHEST, ABDOMEN, AND PELVIS WITH CONTRAST TECHNIQUE: Multidetector CT imaging of the chest, abdomen and pelvis was performed following the standard protocol during bolus administration of intravenous contrast. CONTRAST:  135m ISOVUE-300 IOPAMIDOL (ISOVUE-300) INJECTION 61% COMPARISON:  None. FINDINGS: CT CHEST FINDINGS Cardiovascular: No significant vascular findings. Normal heart size. No pericardial effusion. Mediastinum/Nodes: No enlarged mediastinal, hilar,  or axillary lymph nodes. Thyroid gland, trachea, and esophagus demonstrate no significant findings. There is a right retropectoral lymph node which measures 5 mm 6 mm, image 18 of series 2. Adjacent lymph node measures 8 mm, image 16 of series 2. No axillary or supraclavicular adenopathy. Lungs/Pleura: Lungs are clear. No pleural effusion or pneumothorax.Tiny nodule in the right upper lobe measures 2 mm, image 63 of series 4. Subpleural nodule in the right middle lobe measures 3 mm, image 99 of series 4. In the left lower lobe there is a parenchymal nodule measuring 3 mm, image 96 of series 4. Musculoskeletal: No suspicious bone lesions identified. Right breast lesion measures 1.7 cm, image 33 of series 2. CT ABDOMEN PELVIS FINDINGS Hepatobiliary: No focal liver abnormality is seen. No gallstones, gallbladder wall thickening, or biliary dilatation. Pancreas: Unremarkable. No pancreatic ductal dilatation or surrounding inflammatory changes. Spleen: Normal in size without focal abnormality. Adrenals/Urinary Tract: Adrenal glands are unremarkable. No mass or hydronephrosis. Within the midpole of left kidney there is a 4 mm low-attenuation structure which is too small  to reliably characterize. Bladder is unremarkable. Stomach/Bowel: The stomach is normal. The small bowel loops have a normal course and caliber. No bowel obstruction. Vascular/Lymphatic: Normal appearance of the abdominal aorta. No adenopathy within the upper abdomen or pelvis. Reproductive: The endometrium measures up to 1.4 cm, image 92 of series 7. No adnexal mass. Other: No abdominal wall hernia or abnormality. No abdominopelvic ascites. Musculoskeletal: No aggressive lytic or sclerotic bone lesions. IMPRESSION: 1. Right breast lesion identified compatible with the clinical history of new diagnosis of breast cancer. 2. There are several prominent, non pathologically enlarged right retropectoral lymph nodes. 3. There are a few small, nonspecific  pulmonary nodules identified in both lungs measuring up to 3 mm. Attention on follow-up imaging is advised. 4. The endometrium appears to measure up to 1.4 cm. Endometrial thickness is considered abnormal for an asymptomatic post-menopausal female. Recommend further evaluation with pelvic sonogram for more definitive assessment of the endometrium prior to endometrial sampling. Electronically Signed   By: Kerby Moors M.D.   On: 05/08/2016 16:37   Dg Chest 1 View  Result Date: 05/23/2016 CLINICAL DATA:  Central line placement EXAM: CHEST 1 VIEW COMPARISON:  CT chest 05/08/2016 FINDINGS: Left jugular Port-A-Cath tip in good position at the cavoatrial junction. No pneumothorax. The lungs are clear. IMPRESSION: Satisfactory Port-A-Cath placement. Electronically Signed   By: Franchot Gallo M.D.   On: 05/23/2016 13:15   Dg C-arm 1-60 Min-no Report  Result Date: 05/23/2016 There is no Radiologist interpretation  for this exam.   ASSESSMENT: Clinical stage IIa ER/PR positive, HER-2 negative invasive carcinoma of the upper outer quadrant of the right breast.  PLAN:    1. Clinical stage IIa ER/PR positive, HER-2 negative invasive carcinoma of the upper outer quadrant of the right breast: CT scan results reviewed independently and reported as above with no obvious metastatic disease. Patient has elected to proceed with neoadjuvant chemotherapy using Adriamycin, Cytoxan, and Taxol. Pretreatment MUGA scan revealed an EF of 74%. Proceed with cycle 1 of 4 of dose dense Adriamycin and Cytoxan today. Patient will also receive OnPro Neulasta support. This will be followed by 12 cycles of weekly Taxol. Patient will require surgery and radiation after the completion of her chemotherapy. She will also benefit from an aromatase inhibitor for 5 years at the completion of all treatments.  Return to clinic in 1 week for laboratory work and evaluation and then in 2 weeks for consideration of cycle 2.  2. Endometrial  thickening: Patient was seen by gynecology oncology yesterday. Biopsy results are pending, but no concerns were expressed.  Approximately 30 minutes was spent in discussion of which greater than 50% was consultation.  Patient expressed understanding and was in agreement with this plan. She also understands that She can call clinic at any time with any questions, concerns, or complaints.   Primary cancer of upper outer quadrant of right female breast [facility])   Staging form: Breast, AJCC 7th Edition   - Clinical stage from 04/26/2016: Stage IIA (T1c, N1, M0) - Signed by Lloyd Huger, MD on 04/28/2016  Lloyd Huger, MD   05/25/2016 10:52 AM

## 2016-05-25 ENCOUNTER — Ambulatory Visit: Payer: Commercial Managed Care - HMO

## 2016-05-25 ENCOUNTER — Inpatient Hospital Stay: Payer: 59

## 2016-05-25 ENCOUNTER — Ambulatory Visit: Payer: Commercial Managed Care - HMO | Admitting: Oncology

## 2016-05-25 ENCOUNTER — Other Ambulatory Visit: Payer: Commercial Managed Care - HMO

## 2016-05-25 ENCOUNTER — Inpatient Hospital Stay (HOSPITAL_BASED_OUTPATIENT_CLINIC_OR_DEPARTMENT_OTHER): Payer: 59

## 2016-05-25 ENCOUNTER — Inpatient Hospital Stay (HOSPITAL_BASED_OUTPATIENT_CLINIC_OR_DEPARTMENT_OTHER): Payer: 59 | Admitting: Oncology

## 2016-05-25 VITALS — BP 134/88 | HR 83 | Ht 68.0 in | Wt 160.0 lb

## 2016-05-25 DIAGNOSIS — Z803 Family history of malignant neoplasm of breast: Secondary | ICD-10-CM

## 2016-05-25 DIAGNOSIS — Z809 Family history of malignant neoplasm, unspecified: Secondary | ICD-10-CM

## 2016-05-25 DIAGNOSIS — Z7689 Persons encountering health services in other specified circumstances: Secondary | ICD-10-CM | POA: Diagnosis not present

## 2016-05-25 DIAGNOSIS — F419 Anxiety disorder, unspecified: Secondary | ICD-10-CM

## 2016-05-25 DIAGNOSIS — F329 Major depressive disorder, single episode, unspecified: Secondary | ICD-10-CM | POA: Diagnosis not present

## 2016-05-25 DIAGNOSIS — Z780 Asymptomatic menopausal state: Secondary | ICD-10-CM | POA: Diagnosis not present

## 2016-05-25 DIAGNOSIS — K5900 Constipation, unspecified: Secondary | ICD-10-CM | POA: Diagnosis not present

## 2016-05-25 DIAGNOSIS — R918 Other nonspecific abnormal finding of lung field: Secondary | ICD-10-CM

## 2016-05-25 DIAGNOSIS — R638 Other symptoms and signs concerning food and fluid intake: Secondary | ICD-10-CM

## 2016-05-25 DIAGNOSIS — Z8619 Personal history of other infectious and parasitic diseases: Secondary | ICD-10-CM

## 2016-05-25 DIAGNOSIS — N83201 Unspecified ovarian cyst, right side: Secondary | ICD-10-CM | POA: Diagnosis not present

## 2016-05-25 DIAGNOSIS — R599 Enlarged lymph nodes, unspecified: Secondary | ICD-10-CM | POA: Diagnosis not present

## 2016-05-25 DIAGNOSIS — C50411 Malignant neoplasm of upper-outer quadrant of right female breast: Secondary | ICD-10-CM

## 2016-05-25 DIAGNOSIS — B009 Herpesviral infection, unspecified: Secondary | ICD-10-CM | POA: Diagnosis not present

## 2016-05-25 DIAGNOSIS — Z170 Estrogen receptor positive status [ER+]: Secondary | ICD-10-CM

## 2016-05-25 DIAGNOSIS — N83202 Unspecified ovarian cyst, left side: Secondary | ICD-10-CM | POA: Diagnosis not present

## 2016-05-25 DIAGNOSIS — Z79899 Other long term (current) drug therapy: Secondary | ICD-10-CM

## 2016-05-25 DIAGNOSIS — R51 Headache: Secondary | ICD-10-CM | POA: Diagnosis not present

## 2016-05-25 DIAGNOSIS — R531 Weakness: Secondary | ICD-10-CM | POA: Diagnosis not present

## 2016-05-25 DIAGNOSIS — R5383 Other fatigue: Secondary | ICD-10-CM | POA: Diagnosis not present

## 2016-05-25 DIAGNOSIS — G4700 Insomnia, unspecified: Secondary | ICD-10-CM | POA: Diagnosis not present

## 2016-05-25 DIAGNOSIS — Z5111 Encounter for antineoplastic chemotherapy: Secondary | ICD-10-CM | POA: Diagnosis not present

## 2016-05-25 DIAGNOSIS — R938 Abnormal findings on diagnostic imaging of other specified body structures: Secondary | ICD-10-CM | POA: Diagnosis not present

## 2016-05-25 LAB — CBC WITH DIFFERENTIAL/PLATELET
BASOS ABS: 0 10*3/uL (ref 0–0.1)
Basophils Relative: 1 %
EOS ABS: 0.3 10*3/uL (ref 0–0.7)
EOS PCT: 4 %
HCT: 40.8 % (ref 35.0–47.0)
Hemoglobin: 13.7 g/dL (ref 12.0–16.0)
Lymphocytes Relative: 19 %
Lymphs Abs: 1.3 10*3/uL (ref 1.0–3.6)
MCH: 30 pg (ref 26.0–34.0)
MCHC: 33.7 g/dL (ref 32.0–36.0)
MCV: 89 fL (ref 80.0–100.0)
MONO ABS: 0.6 10*3/uL (ref 0.2–0.9)
Monocytes Relative: 9 %
Neutro Abs: 4.6 10*3/uL (ref 1.4–6.5)
Neutrophils Relative %: 67 %
PLATELETS: 300 10*3/uL (ref 150–440)
RBC: 4.59 MIL/uL (ref 3.80–5.20)
RDW: 12.6 % (ref 11.5–14.5)
WBC: 6.9 10*3/uL (ref 3.6–11.0)

## 2016-05-25 LAB — COMPREHENSIVE METABOLIC PANEL
ALT: 23 U/L (ref 14–54)
AST: 26 U/L (ref 15–41)
Albumin: 4 g/dL (ref 3.5–5.0)
Alkaline Phosphatase: 85 U/L (ref 38–126)
Anion gap: 5 (ref 5–15)
BUN: 18 mg/dL (ref 6–20)
CHLORIDE: 106 mmol/L (ref 101–111)
CO2: 26 mmol/L (ref 22–32)
CREATININE: 0.72 mg/dL (ref 0.44–1.00)
Calcium: 9.2 mg/dL (ref 8.9–10.3)
Glucose, Bld: 85 mg/dL (ref 65–99)
POTASSIUM: 3.9 mmol/L (ref 3.5–5.1)
SODIUM: 137 mmol/L (ref 135–145)
Total Bilirubin: 0.5 mg/dL (ref 0.3–1.2)
Total Protein: 7.4 g/dL (ref 6.5–8.1)

## 2016-05-25 MED ORDER — FOSAPREPITANT DIMEGLUMINE INJECTION 150 MG
Freq: Once | INTRAVENOUS | Status: AC
  Administered 2016-05-25: 11:00:00 via INTRAVENOUS
  Filled 2016-05-25: qty 5

## 2016-05-25 MED ORDER — SODIUM CHLORIDE 0.9% FLUSH
10.0000 mL | INTRAVENOUS | Status: DC | PRN
Start: 2016-05-25 — End: 2016-05-25
  Administered 2016-05-25: 10 mL via INTRAVENOUS
  Filled 2016-05-25: qty 10

## 2016-05-25 MED ORDER — DOXORUBICIN HCL CHEMO IV INJECTION 2 MG/ML
60.0000 mg/m2 | Freq: Once | INTRAVENOUS | Status: AC
  Administered 2016-05-25: 112 mg via INTRAVENOUS
  Filled 2016-05-25: qty 56

## 2016-05-25 MED ORDER — PEGFILGRASTIM 6 MG/0.6ML ~~LOC~~ PSKT
6.0000 mg | PREFILLED_SYRINGE | Freq: Once | SUBCUTANEOUS | Status: AC
Start: 2016-05-25 — End: 2016-05-25
  Administered 2016-05-25: 6 mg via SUBCUTANEOUS

## 2016-05-25 MED ORDER — HEPARIN SOD (PORK) LOCK FLUSH 100 UNIT/ML IV SOLN
500.0000 [IU] | Freq: Once | INTRAVENOUS | Status: AC
  Administered 2016-05-25: 500 [IU] via INTRAVENOUS
  Filled 2016-05-25: qty 5

## 2016-05-25 MED ORDER — SODIUM CHLORIDE 0.9 % IV SOLN
600.0000 mg/m2 | Freq: Once | INTRAVENOUS | Status: AC
  Administered 2016-05-25: 1120 mg via INTRAVENOUS
  Filled 2016-05-25: qty 6

## 2016-05-25 MED ORDER — SODIUM CHLORIDE 0.9 % IV SOLN
Freq: Once | INTRAVENOUS | Status: AC
  Administered 2016-05-25: 11:00:00 via INTRAVENOUS
  Filled 2016-05-25: qty 1000

## 2016-05-25 MED ORDER — PALONOSETRON HCL INJECTION 0.25 MG/5ML
0.2500 mg | Freq: Once | INTRAVENOUS | Status: AC
  Administered 2016-05-25: 0.25 mg via INTRAVENOUS
  Filled 2016-05-25: qty 5

## 2016-05-25 NOTE — Progress Notes (Signed)
Patient here for pre treatment check. No changes since last appointment. 

## 2016-05-26 LAB — SURGICAL PATHOLOGY

## 2016-05-29 ENCOUNTER — Other Ambulatory Visit: Payer: Self-pay | Admitting: *Deleted

## 2016-05-29 ENCOUNTER — Inpatient Hospital Stay: Payer: 59 | Admitting: *Deleted

## 2016-05-29 ENCOUNTER — Encounter: Payer: Self-pay | Admitting: *Deleted

## 2016-05-29 DIAGNOSIS — C50411 Malignant neoplasm of upper-outer quadrant of right female breast: Secondary | ICD-10-CM | POA: Diagnosis not present

## 2016-05-29 DIAGNOSIS — R319 Hematuria, unspecified: Secondary | ICD-10-CM

## 2016-05-29 LAB — BASIC METABOLIC PANEL
ANION GAP: 6 (ref 5–15)
BUN: 19 mg/dL (ref 6–20)
CHLORIDE: 102 mmol/L (ref 101–111)
CO2: 27 mmol/L (ref 22–32)
Calcium: 9.5 mg/dL (ref 8.9–10.3)
Creatinine, Ser: 0.71 mg/dL (ref 0.44–1.00)
GFR calc Af Amer: >60 mL/min (ref >60)
GLUCOSE: 135 mg/dL — AB (ref 65–99)
POTASSIUM: 4 mmol/L (ref 3.5–5.1)
Sodium: 135 mmol/L (ref 135–145)

## 2016-05-29 LAB — CBC WITH DIFFERENTIAL/PLATELET
BASOS ABS: 0 10*3/uL (ref 0–0.1)
BASOS PCT: 1 %
EOS ABS: 0.2 10*3/uL (ref 0–0.7)
Eosinophils Relative: 2 %
HCT: 42.2 % (ref 35.0–47.0)
Hemoglobin: 14.1 g/dL (ref 12.0–16.0)
Lymphocytes Relative: 11 %
Lymphs Abs: 0.9 10*3/uL — ABNORMAL LOW (ref 1.0–3.6)
MCH: 30 pg (ref 26.0–34.0)
MCHC: 33.6 g/dL (ref 32.0–36.0)
MCV: 89.5 fL (ref 80.0–100.0)
MONO ABS: 0.1 10*3/uL — AB (ref 0.2–0.9)
MONOS PCT: 1 %
NEUTROS PCT: 85 %
Neutro Abs: 6.7 10*3/uL — ABNORMAL HIGH (ref 1.4–6.5)
PLATELETS: 275 10*3/uL (ref 150–440)
RBC: 4.71 MIL/uL (ref 3.80–5.20)
RDW: 12.4 % (ref 11.5–14.5)
WBC: 7.9 10*3/uL (ref 3.6–11.0)

## 2016-05-29 LAB — URINALYSIS, COMPLETE (UACMP) WITH MICROSCOPIC
Bacteria, UA: NONE SEEN
Bilirubin Urine: NEGATIVE
Glucose, UA: NEGATIVE mg/dL
Hgb urine dipstick: NEGATIVE
Ketones, ur: NEGATIVE mg/dL
Leukocytes, UA: NEGATIVE
NITRITE: NEGATIVE
PH: 6 (ref 5.0–8.0)
PROTEIN: NEGATIVE mg/dL
SPECIFIC GRAVITY, URINE: 1.019 (ref 1.005–1.030)

## 2016-05-29 NOTE — Progress Notes (Signed)
  Oncology Nurse Navigator Documentation Pathology from endometrial biopsy sent to Dr. Fransisca Connors to review. Navigator Location: [facility] (05/29/16 1400)   )Navigator Encounter Type: Letter/Fax/Email;Diagnostic Results (05/29/16 1400)                                                    Time Spent with Patient: 15 (05/29/16 1400)

## 2016-05-29 NOTE — Progress Notes (Signed)
  Oncology Nurse Navigator Documentation  Navigator Location: [facility] (05/29/16 0900)   )Navigator Encounter Type: Letter/Fax/Email (05/29/16 0900)    Patient emailed with complaints of "spotting" on urination.  Patient had chemo of AC on Thursday.  Notified Dr. Grayland Ormond.  Met B and a UA ordered.  Let patient a voicemail and email of appointment for labs  today @ 11:30.      Barriers/Navigation Needs: Education;Coordination of Care (05/29/16 0900)   Interventions: Coordination of Care (05/29/16 0900)            Acuity: Level 2 (05/29/16 0900)         Time Spent with Patient: 30 (05/29/16 0900)

## 2016-05-30 ENCOUNTER — Telehealth: Payer: Self-pay

## 2016-05-30 DIAGNOSIS — R9389 Abnormal findings on diagnostic imaging of other specified body structures: Secondary | ICD-10-CM

## 2016-05-30 HISTORY — DX: Abnormal findings on diagnostic imaging of other specified body structures: R93.89

## 2016-05-30 NOTE — Telephone Encounter (Signed)
  Oncology Nurse Navigator Documentation Attempt made to notify ms. Needham of negative endometrial biopsy. Unable to leave message on home or mobile device. Needs repeat U/S in 6 months. Will have schedulers arrange. Navigator Location: [facility] (05/30/16 0900)   )Navigator Encounter Type: Telephone;Diagnostic Results (05/30/16 0900)                                                    Time Spent with Patient: 15 (05/30/16 0900)

## 2016-05-31 NOTE — Progress Notes (Signed)
Kimberly Clarke  [phone] [phone]) [phone]  ID: Kimberly Clarke OB: 03/27/1956  MR#: [id number]  OXB#:[id number]  [name], MD as PCP - General (Family Medicine) Clent Jacks, RN as Registered Nurse  CHIEF COMPLAINT: Clinical stage IIa ER/PR positive, HER-2 negative invasive carcinoma of the upper outer quadrant of the right breast.  INTERVAL HISTORY: Patient returns to clinic today for further evaluation and to assess her toleration of cycle 1 of Adriamycin and Cytoxan. She continues to be highly anxious, and reports occasional insomnia, but otherwise feels well. She reports some fatigue, denies weakness.  She has no neurologic complaints. She denies any recent fevers or illnesses. She has a good appetite and denies weight loss. She reports that she had a headache earlier today, resolved after she ate. She has no chest pain or shortness of breath. She reports occasional nausea, controlled with zofran, and denies any vomiting or diarrhea. She reports occasional constipation, relieved with OTC FiberCon and Colace. She has no urinary complaints. Patient offers no further specific complaints today.   REVIEW OF SYSTEMS:   Review of Systems  Constitutional: Positive for malaise/fatigue. Negative for fever and weight loss.  HENT: Negative for congestion and sinus pain.   Respiratory: Negative.  Negative for cough and shortness of breath.   Cardiovascular: Negative for chest pain and leg swelling.  Gastrointestinal: Positive for constipation and nausea. Negative for abdominal pain, diarrhea and vomiting.  Genitourinary: Negative.  Negative for frequency and urgency.  Musculoskeletal: Negative.   Neurological: Positive for headaches. Negative for dizziness, tingling, sensory change and weakness.  Psychiatric/Behavioral: Positive for depression. The patient is nervous/anxious and has insomnia.     As per HPI. Otherwise, a complete  review of systems is negative.  PAST MEDICAL HISTORY: Past Medical History:  Diagnosis Date  . Anxiety   . Cancer ([place]) 04/24/2016   right breast  . HSV infection    HISTORY OF HSV  . PONV (postoperative nausea and vomiting)    nausea no vomiting  . Post-menopausal 10/20/2005  . Postmenopause bleeding   . Thickened endometrium 05/30/2016    PAST SURGICAL HISTORY: Past Surgical History:  Procedure Laterality Date  . ANAL FISTULECTOMY    . ENDOMETRIAL BIOPSY    . KNEE SURGERY     snow skating  . PORTACATH PLACEMENT N/A 05/23/2016   Procedure: INSERTION PORT-A-CATH;  Surgeon: Leonie Green, MD;  Location: [facility] ORS;  Service: General;  Laterality: N/A;    FAMILY HISTORY: Family History  Problem Relation Age of Onset  . Cancer Mother     BREAST AND BONES  . Breast cancer Mother 3  . Emphysema Father   . Cancer Father   . Cancer Maternal Aunt     ADVANCED DIRECTIVES (Y/N):  N  HEALTH MAINTENANCE: Social History  Substance Use Topics  . Smoking status: Never Smoker  . Smokeless tobacco: Never Used  . Alcohol use 3.6 - 4.8 oz/week    3 - 4 Glasses of wine, 3 - 4 Cans of beer per week     Colonoscopy:  PAP:  Bone density:  Lipid panel:  Allergies  Allergen Reactions  . Penicillins Anaphylaxis    unknown    Current Outpatient Prescriptions  Medication Sig Dispense Refill  . ALPRAZolam (XANAX) 0.25 MG tablet Take 1 tablet (0.25 mg total) by mouth at bedtime. (Patient taking differently: Take 0.25 mg by mouth at bedtime as needed. ) 30 tablet 0  . citalopram (CELEXA)  20 MG tablet Take 20 mg by mouth daily.     Marland Kitchen lidocaine-prilocaine (EMLA) cream Apply to affected area once 30 g 3  . ondansetron (ZOFRAN) 8 MG tablet Take 1 tablet (8 mg total) by mouth 2 (two) times daily as needed. Start on the third day after chemotherapy. 30 tablet 1  . HYDROcodone-acetaminophen (NORCO) 5-325 MG tablet Take 1 tablet by mouth every 4 (four) hours as needed for moderate pain.  (Patient not taking: Reported on 06/01/2016) 4 tablet 0  . prochlorperazine (COMPAZINE) 10 MG tablet Take 1 tablet (10 mg total) by mouth every 6 (six) hours as needed (Nausea or vomiting). (Patient not taking: Reported on 06/01/2016) 30 tablet 1   No current facility-administered medications for this visit.     OBJECTIVE: Vitals:   06/01/16 1523  BP: 112/80  Pulse: 79  Resp: 18  Temp: 98.4 F (36.9 C)     Body mass index is 25.06 kg/m.    ECOG FS:0 - Asymptomatic  General: Well-developed, well-nourished, no acute distress. Eyes: Pink conjunctiva, anicteric sclera. Breasts: Patient requested exam be deferred today. Lungs: Clear to auscultation bilaterally. Heart: Regular rate and rhythm. No rubs, murmurs, or gallops. Abdomen: Soft, nontender, nondistended. No organomegaly noted, normoactive bowel sounds. Musculoskeletal: No edema, cyanosis, or clubbing. Neuro: Alert, answering all questions appropriately. Cranial nerves grossly intact. Skin: No rashes or petechiae noted. Psych: Normal affect.   LAB RESULTS:  Lab Results  Component Value Date   NA 134 (L) 06/01/2016   K 4.1 06/01/2016   CL 103 06/01/2016   CO2 26 06/01/2016   GLUCOSE 88 06/01/2016   BUN 15 06/01/2016   CREATININE 0.74 06/01/2016   CALCIUM 9.1 06/01/2016   PROT 7.3 06/01/2016   ALBUMIN 4.3 06/01/2016   AST 26 06/01/2016   ALT 28 06/01/2016   ALKPHOS 80 06/01/2016   BILITOT 0.7 06/01/2016   GFRNONAA >60 06/01/2016   GFRAA >60 06/01/2016    Lab Results  Component Value Date   WBC 5.3 06/01/2016   NEUTROABS 4.1 06/01/2016   HGB 13.6 06/01/2016   HCT 40.4 06/01/2016   MCV 89.4 06/01/2016   PLT 229 06/01/2016     STUDIES: Ct Chest W Contrast  Result Date: 05/08/2016 CLINICAL DATA:  New diagnosis of breast cancer. EXAM: CT CHEST, ABDOMEN, AND PELVIS WITH CONTRAST TECHNIQUE: Multidetector CT imaging of the chest, abdomen and pelvis was performed following the standard protocol during bolus  administration of intravenous contrast. CONTRAST:  164m ISOVUE-300 IOPAMIDOL (ISOVUE-300) INJECTION 61% COMPARISON:  None. FINDINGS: CT CHEST FINDINGS Cardiovascular: No significant vascular findings. Normal heart size. No pericardial effusion. Mediastinum/Nodes: No enlarged mediastinal, hilar, or axillary lymph nodes. Thyroid gland, trachea, and esophagus demonstrate no significant findings. There is a right retropectoral lymph node which measures 5 mm 6 mm, image 18 of series 2. Adjacent lymph node measures 8 mm, image 16 of series 2. No axillary or supraclavicular adenopathy. Lungs/Pleura: Lungs are clear. No pleural effusion or pneumothorax.Tiny nodule in the right upper lobe measures 2 mm, image 63 of series 4. Subpleural nodule in the right middle lobe measures 3 mm, image 99 of series 4. In the left lower lobe there is a parenchymal nodule measuring 3 mm, image 96 of series 4. Musculoskeletal: No suspicious bone lesions identified. Right breast lesion measures 1.7 cm, image 33 of series 2. CT ABDOMEN PELVIS FINDINGS Hepatobiliary: No focal liver abnormality is seen. No gallstones, gallbladder wall thickening, or biliary dilatation. Pancreas: Unremarkable. No pancreatic ductal dilatation or  surrounding inflammatory changes. Spleen: Normal in size without focal abnormality. Adrenals/Urinary Tract: Adrenal glands are unremarkable. No mass or hydronephrosis. Within the midpole of left kidney there is a 4 mm low-attenuation structure which is too small to reliably characterize. Bladder is unremarkable. Stomach/Bowel: The stomach is normal. The small bowel loops have a normal course and caliber. No bowel obstruction. Vascular/Lymphatic: Normal appearance of the abdominal aorta. No adenopathy within the upper abdomen or pelvis. Reproductive: The endometrium measures up to 1.4 cm, image 92 of series 7. No adnexal mass. Other: No abdominal wall hernia or abnormality. No abdominopelvic ascites. Musculoskeletal: No  aggressive lytic or sclerotic bone lesions. IMPRESSION: 1. Right breast lesion identified compatible with the clinical history of new diagnosis of breast cancer. 2. There are several prominent, non pathologically enlarged right retropectoral lymph nodes. 3. There are a few small, nonspecific pulmonary nodules identified in both lungs measuring up to 3 mm. Attention on follow-up imaging is advised. 4. The endometrium appears to measure up to 1.4 cm. Endometrial thickness is considered abnormal for an asymptomatic post-menopausal female. Recommend further evaluation with pelvic sonogram for more definitive assessment of the endometrium prior to endometrial sampling. Electronically Signed   By: Kerby Moors M.D.   On: 05/08/2016 16:37   Nm Cardiac Muga Rest  Result Date: 05/08/2016 CLINICAL DATA:  Breast cancer. Evaluate cardiac function in relation to chemotherapy. EXAM: NUCLEAR MEDICINE CARDIAC BLOOD POOL IMAGING (MUGA) TECHNIQUE: Cardiac multi-gated acquisition was performed at rest following intravenous injection of Tc-8mlabeled red blood cells. RADIOPHARMACEUTICALS:  22 mCi Tc-910mDP in-vitro labeled red blood cells IV COMPARISON:  None. FINDINGS: No  focal wall motion abnormality of the left ventricle. Calculated left ventricular ejection fraction equals 74% IMPRESSION: Left ventricular ejection fraction equals74 %. Electronically Signed   By: StSuzy Bouchard.D.   On: 05/08/2016 08:10   [place]ansvaginal Non-ob  Result Date: 05/19/2016 CLINICAL DATA:  Thickened endometrium on CT exam.  Postmenopausal. EXAM: TRANSABDOMINAL AND TRANSVAGINAL ULTRASOUND OF PELVIS TECHNIQUE: Both transabdominal and transvaginal ultrasound examinations of the pelvis were performed. Transabdominal technique was performed for global imaging of the pelvis including uterus, ovaries, adnexal regions, and pelvic cul-de-sac. It was necessary to proceed with endovaginal exam following the transabdominal exam to visualize the  endometrium and left ovary. COMPARISON:  05/08/2016 CT FINDINGS: Uterus Measurements: 6.2 x 1.9 x 2.8 cm. No fibroids or other mass visualized. Endometrium Thickness: 13 mm.  Heterogeneous with cystic spaces and vascularity. Right ovary Measurements: 2.2 x 2.0 x 3.0 cm. Anechoic simple cyst is 2.3 x 1.5 x 2.0 cm Left ovary Measurements: 1.8 x 0.8 x 2.4 cm. Small para ovarian cyst is 0.7 x 0.5 x 0.7 cm. Other findings No abnormal free fluid. IMPRESSION: 1. Thickened heterogeneous endometrium with cystic spaces. Endometrial thickness is considered abnormal for an asymptomatic post-menopausal female. Endometrial sampling should be considered to exclude carcinoma. 2. Right ovarian simple appearing cyst measuring 2.3 cm. This is almost certainly benign, but follow up ultrasound is recommended in 1 year according to the Society [place] Radiologists [place] (D LeClovis Rileyt al. Management of Asymptomatic Ovarian and Other Adnexal Cysts Imaged at [place] 2010. Radiology 256 (Sept 2010): 943-954.). 3. Small left para ovarian cyst. Electronically Signed   By: ElNolon Nations.D.   On: 05/19/2016 15:01   Us[place]oreaelvis Complete  Result Date: 05/19/2016 CLINICAL DATA:  Thickened endometrium on CT exam.  Postmenopausal. EXAM: TRANSABDOMINAL AND TRANSVAGINAL ULTRASOUND  OF PELVIS TECHNIQUE: Both transabdominal and transvaginal ultrasound examinations of the pelvis were performed. Transabdominal technique was performed for global imaging of the pelvis including uterus, ovaries, adnexal regions, and pelvic cul-de-sac. It was necessary to proceed with endovaginal exam following the transabdominal exam to visualize the endometrium and left ovary. COMPARISON:  05/08/2016 CT FINDINGS: Uterus Measurements: 6.2 x 1.9 x 2.8 cm. No fibroids or other mass visualized. Endometrium Thickness: 13 mm.  Heterogeneous with cystic spaces and  vascularity. Right ovary Measurements: 2.2 x 2.0 x 3.0 cm. Anechoic simple cyst is 2.3 x 1.5 x 2.0 cm Left ovary Measurements: 1.8 x 0.8 x 2.4 cm. Small para ovarian cyst is 0.7 x 0.5 x 0.7 cm. Other findings No abnormal free fluid. IMPRESSION: 1. Thickened heterogeneous endometrium with cystic spaces. Endometrial thickness is considered abnormal for an asymptomatic post-menopausal female. Endometrial sampling should be considered to exclude carcinoma. 2. Right ovarian simple appearing cyst measuring 2.3 cm. This is almost certainly benign, but follow up ultrasound is recommended in 1 year according to the Society of Radiologists in [place] Statement (D Clovis Riley et al. Management of Asymptomatic Ovarian and Other Adnexal Cysts Imaged at [place]: Society of Radiologists in [place] Statement 2010. Radiology 256 (Sept 2010): 943-954.). 3. Small left para ovarian cyst. Electronically Signed   By: Nolon Nations M.D.   On: 05/19/2016 15:01   Ct Abdomen Pelvis W Contrast  Result Date: 05/08/2016 CLINICAL DATA:  New diagnosis of breast cancer. EXAM: CT CHEST, ABDOMEN, AND PELVIS WITH CONTRAST TECHNIQUE: Multidetector CT imaging of the chest, abdomen and pelvis was performed following the standard protocol during bolus administration of intravenous contrast. CONTRAST:  175m ISOVUE-300 IOPAMIDOL (ISOVUE-300) INJECTION 61% COMPARISON:  None. FINDINGS: CT CHEST FINDINGS Cardiovascular: No significant vascular findings. Normal heart size. No pericardial effusion. Mediastinum/Nodes: No enlarged mediastinal, hilar, or axillary lymph nodes. Thyroid gland, trachea, and esophagus demonstrate no significant findings. There is a right retropectoral lymph node which measures 5 mm 6 mm, image 18 of series 2. Adjacent lymph node measures 8 mm, image 16 of series 2. No axillary or supraclavicular adenopathy. Lungs/Pleura: Lungs are clear. No pleural effusion or pneumothorax.Tiny nodule  in the right upper lobe measures 2 mm, image 63 of series 4. Subpleural nodule in the right middle lobe measures 3 mm, image 99 of series 4. In the left lower lobe there is a parenchymal nodule measuring 3 mm, image 96 of series 4. Musculoskeletal: No suspicious bone lesions identified. Right breast lesion measures 1.7 cm, image 33 of series 2. CT ABDOMEN PELVIS FINDINGS Hepatobiliary: No focal liver abnormality is seen. No gallstones, gallbladder wall thickening, or biliary dilatation. Pancreas: Unremarkable. No pancreatic ductal dilatation or surrounding inflammatory changes. Spleen: Normal in size without focal abnormality. Adrenals/Urinary Tract: Adrenal glands are unremarkable. No mass or hydronephrosis. Within the midpole of left kidney there is a 4 mm low-attenuation structure which is too small to reliably characterize. Bladder is unremarkable. Stomach/Bowel: The stomach is normal. The small bowel loops have a normal course and caliber. No bowel obstruction. Vascular/Lymphatic: Normal appearance of the abdominal aorta. No adenopathy within the upper abdomen or pelvis. Reproductive: The endometrium measures up to 1.4 cm, image 92 of series 7. No adnexal mass. Other: No abdominal wall hernia or abnormality. No abdominopelvic ascites. Musculoskeletal: No aggressive lytic or sclerotic bone lesions. IMPRESSION: 1. Right breast lesion identified compatible with the clinical history of new diagnosis of breast cancer. 2. There are several prominent, non pathologically enlarged right retropectoral  lymph nodes. 3. There are a few small, nonspecific pulmonary nodules identified in both lungs measuring up to 3 mm. Attention on follow-up imaging is advised. 4. The endometrium appears to measure up to 1.4 cm. Endometrial thickness is considered abnormal for an asymptomatic post-menopausal female. Recommend further evaluation with pelvic sonogram for more definitive assessment of the endometrium prior to endometrial  sampling. Electronically Signed   By: Kerby Moors M.D.   On: 05/08/2016 16:37   Dg Chest 1 View  Result Date: 05/23/2016 CLINICAL DATA:  Central line placement EXAM: CHEST 1 VIEW COMPARISON:  CT chest 05/08/2016 FINDINGS: Left jugular Port-A-Cath tip in good position at the cavoatrial junction. No pneumothorax. The lungs are clear. IMPRESSION: Satisfactory Port-A-Cath placement. Electronically Signed   By: Franchot Gallo M.D.   On: 05/23/2016 13:15   Dg C-arm 1-60 Min-no Report  Result Date: 05/23/2016 There is no Radiologist interpretation  for this exam.   ASSESSMENT: Clinical stage IIa ER/PR positive, HER-2 negative invasive carcinoma of the upper outer quadrant of the right breast.  PLAN:    1. Clinical stage IIa ER/PR positive, HER-2 negative invasive carcinoma of the upper outer quadrant of the right breast: CT scan results reviewed independently and reported as above with no obvious metastatic disease. Patient has elected to proceed with neoadjuvant chemotherapy using Adriamycin, Cytoxan, and Taxol. Pretreatment MUGA scan revealed an EF of 74%. Patient received cycle 1 of 4 of dose dense Adriamycin and Cytoxan on 05/25/16. Patient also receiving OnPro Neulasta support. This will be followed by 12 cycles of weekly Taxol. Patient will require surgery and radiation after the completion of her chemotherapy. She will also benefit from an aromatase inhibitor for 5 years at the completion of all treatments.  Return to clinic in 1 week for evaluation and consideration of cycle 2.  2. Endometrial thickening: Patient was seen by gynecology oncology. Biopsy confirms no evidence of malignancy.  3. Anxiety/Insomia: Discussed 4-7-8 breathing technique at bedtime: breathe in to count of 4, hold breath for count of 7, exhale for count of 8; do 3-5 times for letting go of overactive thoughts. 4. Fatigue: Discussed energizing breathing techniques; provided breath ratio chart handout outlining relaxing,  balancing, and energizing breathing techniques.  .  Patient expressed understanding and was in agreement with this plan. She also understands that She can call clinic at any time with any questions, concerns, or complaints.   Primary cancer of upper outer quadrant of right female breast [facility])   Staging form: Breast, AJCC 7th Edition   - Clinical stage from 04/26/2016: Stage IIA (T1c, N1, M0) - Signed by Lloyd Huger, MD on 05/25/2016   Lucendia Herrlich, NP  06/01/16 7:01 PM   Patient was seen and evaluated independently and I agree with the assessment and plan as dictated above. Patient tolerated her first treatment well without significant side effects. Return to clinic in 1 week as above consideration of cycle 2.  Lloyd Huger, MD 06/04/16 9:43 AM

## 2016-06-01 ENCOUNTER — Inpatient Hospital Stay (HOSPITAL_BASED_OUTPATIENT_CLINIC_OR_DEPARTMENT_OTHER): Payer: 59 | Admitting: Oncology

## 2016-06-01 ENCOUNTER — Ambulatory Visit: Payer: Commercial Managed Care - HMO | Admitting: Oncology

## 2016-06-01 ENCOUNTER — Other Ambulatory Visit: Payer: Commercial Managed Care - HMO

## 2016-06-01 ENCOUNTER — Inpatient Hospital Stay: Payer: 59

## 2016-06-01 ENCOUNTER — Ambulatory Visit: Payer: Commercial Managed Care - HMO

## 2016-06-01 VITALS — BP 112/80 | HR 79 | Temp 98.4°F | Resp 18 | Wt 164.8 lb

## 2016-06-01 DIAGNOSIS — Z170 Estrogen receptor positive status [ER+]: Secondary | ICD-10-CM

## 2016-06-01 DIAGNOSIS — Z803 Family history of malignant neoplasm of breast: Secondary | ICD-10-CM

## 2016-06-01 DIAGNOSIS — R918 Other nonspecific abnormal finding of lung field: Secondary | ICD-10-CM

## 2016-06-01 DIAGNOSIS — G4700 Insomnia, unspecified: Secondary | ICD-10-CM

## 2016-06-01 DIAGNOSIS — F419 Anxiety disorder, unspecified: Secondary | ICD-10-CM | POA: Diagnosis not present

## 2016-06-01 DIAGNOSIS — N83202 Unspecified ovarian cyst, left side: Secondary | ICD-10-CM

## 2016-06-01 DIAGNOSIS — R938 Abnormal findings on diagnostic imaging of other specified body structures: Secondary | ICD-10-CM

## 2016-06-01 DIAGNOSIS — R531 Weakness: Secondary | ICD-10-CM

## 2016-06-01 DIAGNOSIS — F329 Major depressive disorder, single episode, unspecified: Secondary | ICD-10-CM

## 2016-06-01 DIAGNOSIS — C50411 Malignant neoplasm of upper-outer quadrant of right female breast: Secondary | ICD-10-CM

## 2016-06-01 DIAGNOSIS — Z809 Family history of malignant neoplasm, unspecified: Secondary | ICD-10-CM

## 2016-06-01 DIAGNOSIS — Z8619 Personal history of other infectious and parasitic diseases: Secondary | ICD-10-CM

## 2016-06-01 DIAGNOSIS — Z780 Asymptomatic menopausal state: Secondary | ICD-10-CM

## 2016-06-01 DIAGNOSIS — Z79899 Other long term (current) drug therapy: Secondary | ICD-10-CM

## 2016-06-01 DIAGNOSIS — B009 Herpesviral infection, unspecified: Secondary | ICD-10-CM

## 2016-06-01 DIAGNOSIS — R599 Enlarged lymph nodes, unspecified: Secondary | ICD-10-CM

## 2016-06-01 DIAGNOSIS — R5383 Other fatigue: Secondary | ICD-10-CM

## 2016-06-01 DIAGNOSIS — R51 Headache: Secondary | ICD-10-CM

## 2016-06-01 DIAGNOSIS — N83201 Unspecified ovarian cyst, right side: Secondary | ICD-10-CM

## 2016-06-01 DIAGNOSIS — K5900 Constipation, unspecified: Secondary | ICD-10-CM

## 2016-06-01 LAB — CBC WITH DIFFERENTIAL/PLATELET
BASOS PCT: 1 %
Basophils Absolute: 0.1 10*3/uL (ref 0–0.1)
EOS ABS: 0.1 10*3/uL (ref 0–0.7)
Eosinophils Relative: 3 %
HCT: 40.4 % (ref 35.0–47.0)
Hemoglobin: 13.6 g/dL (ref 12.0–16.0)
LYMPHS ABS: 0.8 10*3/uL — AB (ref 1.0–3.6)
Lymphocytes Relative: 16 %
MCH: 30.1 pg (ref 26.0–34.0)
MCHC: 33.6 g/dL (ref 32.0–36.0)
MCV: 89.4 fL (ref 80.0–100.0)
MONO ABS: 0.1 10*3/uL — AB (ref 0.2–0.9)
MONOS PCT: 2 %
Neutro Abs: 4.1 10*3/uL (ref 1.4–6.5)
Neutrophils Relative %: 78 %
Platelets: 229 10*3/uL (ref 150–440)
RBC: 4.52 MIL/uL (ref 3.80–5.20)
RDW: 12.2 % (ref 11.5–14.5)
WBC: 5.3 10*3/uL (ref 3.6–11.0)

## 2016-06-01 LAB — COMPREHENSIVE METABOLIC PANEL
ALBUMIN: 4.3 g/dL (ref 3.5–5.0)
ALT: 28 U/L (ref 14–54)
ANION GAP: 5 (ref 5–15)
AST: 26 U/L (ref 15–41)
Alkaline Phosphatase: 80 U/L (ref 38–126)
BILIRUBIN TOTAL: 0.7 mg/dL (ref 0.3–1.2)
BUN: 15 mg/dL (ref 6–20)
CALCIUM: 9.1 mg/dL (ref 8.9–10.3)
CO2: 26 mmol/L (ref 22–32)
Chloride: 103 mmol/L (ref 101–111)
Creatinine, Ser: 0.74 mg/dL (ref 0.44–1.00)
GFR calc non Af Amer: >60 mL/min (ref >60)
GLUCOSE: 88 mg/dL (ref 65–99)
POTASSIUM: 4.1 mmol/L (ref 3.5–5.1)
SODIUM: 134 mmol/L — AB (ref 135–145)
TOTAL PROTEIN: 7.3 g/dL (ref 6.5–8.1)

## 2016-06-01 NOTE — Progress Notes (Signed)
Patient is here for follow up  

## 2016-06-02 ENCOUNTER — Encounter: Payer: Self-pay | Admitting: Oncology

## 2016-06-05 NOTE — Progress Notes (Deleted)
[place]  [phone] [phone]  ID: Kimberly Clarke OB: 1955/07/09  MR#: [id number]  JJO#:[id number]  [name], MD as PCP - General (Family Medicine) Clent Jacks, RN as Registered Nurse  CHIEF COMPLAINT: Clinical stage IIa ER/PR positive, HER-2 negative invasive carcinoma of the upper outer quadrant of the right breast.  INTERVAL HISTORY: Patient returns to clinic today for further evaluation and to assess her toleration of cycle 1 of Adriamycin and Cytoxan. She continues to be highly anxious, and reports occasional insomnia, but otherwise feels well. She reports some fatigue, denies weakness.  She has no neurologic complaints. She denies any recent fevers or illnesses. She has a good appetite and denies weight loss. She reports that she had a headache earlier today, resolved after she ate. She has no chest pain or shortness of breath. She reports occasional nausea, controlled with zofran, and denies any vomiting or diarrhea. She reports occasional constipation, relieved with OTC FiberCon and Colace. She has no urinary complaints. Patient offers no further specific complaints today.   REVIEW OF SYSTEMS:   Review of Systems  Constitutional: Positive for malaise/fatigue. Negative for fever and weight loss.  HENT: Negative for congestion and sinus pain.   Respiratory: Negative.  Negative for cough and shortness of breath.   Cardiovascular: Negative for chest pain and leg swelling.  Gastrointestinal: Positive for constipation and nausea. Negative for abdominal pain, diarrhea and vomiting.  Genitourinary: Negative.  Negative for frequency and urgency.  Musculoskeletal: Negative.   Neurological: Positive for headaches. Negative for dizziness, tingling, sensory change and weakness.  Psychiatric/Behavioral: Positive for depression. The patient is nervous/anxious and has insomnia.     As per HPI. Otherwise, a complete  review of systems is negative.  PAST MEDICAL HISTORY: Past Medical History:  Diagnosis Date  . Anxiety   . Cancer ([place]) 04/24/2016   right breast  . HSV infection    HISTORY OF HSV  . PONV (postoperative nausea and vomiting)    nausea no vomiting  . Post-menopausal 10/20/2005  . Postmenopause bleeding   . Thickened endometrium 05/30/2016    PAST SURGICAL HISTORY: Past Surgical History:  Procedure Laterality Date  . ANAL FISTULECTOMY    . ENDOMETRIAL BIOPSY    . KNEE SURGERY     snow skating  . PORTACATH PLACEMENT N/A 05/23/2016   Procedure: INSERTION PORT-A-CATH;  Surgeon: Leonie Green, MD;  Location: [facility] ORS;  Service: General;  Laterality: N/A;    FAMILY HISTORY: Family History  Problem Relation Age of Onset  . Cancer Mother     BREAST AND BONES  . Breast cancer Mother 38  . Emphysema Father   . Cancer Father   . Cancer Maternal Aunt     ADVANCED DIRECTIVES (Y/N):  N  HEALTH MAINTENANCE: Social History  Substance Use Topics  . Smoking status: Never Smoker  . Smokeless tobacco: Never Used  . Alcohol use 3.6 - 4.8 oz/week    3 - 4 Glasses of wine, 3 - 4 Cans of beer per week     Colonoscopy:  PAP:  Bone density:  Lipid panel:  Allergies  Allergen Reactions  . Penicillins Anaphylaxis    unknown    Current Outpatient Prescriptions  Medication Sig Dispense Refill  . ALPRAZolam (XANAX) 0.25 MG tablet Take 1 tablet (0.25 mg total) by mouth at bedtime. (Patient taking differently: Take 0.25 mg by mouth at bedtime as needed. ) 30 tablet 0  . citalopram (CELEXA)  20 MG tablet Take 20 mg by mouth daily.     Marland Kitchen HYDROcodone-acetaminophen (NORCO) 5-325 MG tablet Take 1 tablet by mouth every 4 (four) hours as needed for moderate pain. (Patient not taking: Reported on 06/01/2016) 4 tablet 0  . lidocaine-prilocaine (EMLA) cream Apply to affected area once 30 g 3  . ondansetron (ZOFRAN) 8 MG tablet Take 1 tablet (8 mg total) by mouth 2 (two) times daily as needed.  Start on the third day after chemotherapy. 30 tablet 1  . prochlorperazine (COMPAZINE) 10 MG tablet Take 1 tablet (10 mg total) by mouth every 6 (six) hours as needed (Nausea or vomiting). (Patient not taking: Reported on 06/01/2016) 30 tablet 1   No current facility-administered medications for this visit.     OBJECTIVE: There were no vitals filed for this visit.   There is no height or weight on file to calculate BMI.    ECOG FS:0 - Asymptomatic  General: Well-developed, well-nourished, no acute distress. Eyes: Pink conjunctiva, anicteric sclera. Breasts: Patient requested exam be deferred today. Lungs: Clear to auscultation bilaterally. Heart: Regular rate and rhythm. No rubs, murmurs, or gallops. Abdomen: Soft, nontender, nondistended. No organomegaly noted, normoactive bowel sounds. Musculoskeletal: No edema, cyanosis, or clubbing. Neuro: Alert, answering all questions appropriately. Cranial nerves grossly intact. Skin: No rashes or petechiae noted. Psych: Normal affect.   LAB RESULTS:  Lab Results  Component Value Date   NA 134 (L) 06/01/2016   K 4.1 06/01/2016   CL 103 06/01/2016   CO2 26 06/01/2016   GLUCOSE 88 06/01/2016   BUN 15 06/01/2016   CREATININE 0.74 06/01/2016   CALCIUM 9.1 06/01/2016   PROT 7.3 06/01/2016   ALBUMIN 4.3 06/01/2016   AST 26 06/01/2016   ALT 28 06/01/2016   ALKPHOS 80 06/01/2016   BILITOT 0.7 06/01/2016   GFRNONAA >60 06/01/2016   GFRAA >60 06/01/2016    Lab Results  Component Value Date   WBC 5.3 06/01/2016   NEUTROABS 4.1 06/01/2016   HGB 13.6 06/01/2016   HCT 40.4 06/01/2016   MCV 89.4 06/01/2016   PLT 229 06/01/2016     STUDIES: Ct Chest W Contrast  Result Date: 05/08/2016 CLINICAL DATA:  New diagnosis of breast cancer. EXAM: CT CHEST, ABDOMEN, AND PELVIS WITH CONTRAST TECHNIQUE: Multidetector CT imaging of the chest, abdomen and pelvis was performed following the standard protocol during bolus administration of intravenous  contrast. CONTRAST:  129m ISOVUE-300 IOPAMIDOL (ISOVUE-300) INJECTION 61% COMPARISON:  None. FINDINGS: CT CHEST FINDINGS Cardiovascular: No significant vascular findings. Normal heart size. No pericardial effusion. Mediastinum/Nodes: No enlarged mediastinal, hilar, or axillary lymph nodes. Thyroid gland, trachea, and esophagus demonstrate no significant findings. There is a right retropectoral lymph node which measures 5 mm 6 mm, image 18 of series 2. Adjacent lymph node measures 8 mm, image 16 of series 2. No axillary or supraclavicular adenopathy. Lungs/Pleura: Lungs are clear. No pleural effusion or pneumothorax.Tiny nodule in the right upper lobe measures 2 mm, image 63 of series 4. Subpleural nodule in the right middle lobe measures 3 mm, image 99 of series 4. In the left lower lobe there is a parenchymal nodule measuring 3 mm, image 96 of series 4. Musculoskeletal: No suspicious bone lesions identified. Right breast lesion measures 1.7 cm, image 33 of series 2. CT ABDOMEN PELVIS FINDINGS Hepatobiliary: No focal liver abnormality is seen. No gallstones, gallbladder wall thickening, or biliary dilatation. Pancreas: Unremarkable. No pancreatic ductal dilatation or surrounding inflammatory changes. Spleen: Normal in size without focal  abnormality. Adrenals/Urinary Tract: Adrenal glands are unremarkable. No mass or hydronephrosis. Within the midpole of left kidney there is a 4 mm low-attenuation structure which is too small to reliably characterize. Bladder is unremarkable. Stomach/Bowel: The stomach is normal. The small bowel loops have a normal course and caliber. No bowel obstruction. Vascular/Lymphatic: Normal appearance of the abdominal aorta. No adenopathy within the upper abdomen or pelvis. Reproductive: The endometrium measures up to 1.4 cm, image 92 of series 7. No adnexal mass. Other: No abdominal wall hernia or abnormality. No abdominopelvic ascites. Musculoskeletal: No aggressive lytic or sclerotic  bone lesions. IMPRESSION: 1. Right breast lesion identified compatible with the clinical history of new diagnosis of breast cancer. 2. There are several prominent, non pathologically enlarged right retropectoral lymph nodes. 3. There are a few small, nonspecific pulmonary nodules identified in both lungs measuring up to 3 mm. Attention on follow-up imaging is advised. 4. The endometrium appears to measure up to 1.4 cm. Endometrial thickness is considered abnormal for an asymptomatic post-menopausal female. Recommend further evaluation with pelvic sonogram for more definitive assessment of the endometrium prior to endometrial sampling. Electronically Signed   By: Kerby Moors M.D.   On: 05/08/2016 16:37   US Transvaginal Non-ob  Result Date: 05/19/2016 CLINICAL DATA:  Thickened endometrium on CT exam.  Postmenopausal. EXAM: TRANSABDOMINAL AND TRANSVAGINAL ULTRASOUND OF PELVIS TECHNIQUE: Both transabdominal and transvaginal ultrasound examinations of the pelvis were performed. Transabdominal technique was performed for global imaging of the pelvis including uterus, ovaries, adnexal regions, and pelvic cul-de-sac. It was necessary to proceed with endovaginal exam following the transabdominal exam to visualize the endometrium and left ovary. COMPARISON:  05/08/2016 CT FINDINGS: Uterus Measurements: 6.2 x 1.9 x 2.8 cm. No fibroids or other mass visualized. Endometrium Thickness: 13 mm.  Heterogeneous with cystic spaces and vascularity. Right ovary Measurements: 2.2 x 2.0 x 3.0 cm. Anechoic simple cyst is 2.3 x 1.5 x 2.0 cm Left ovary Measurements: 1.8 x 0.8 x 2.4 cm. Small para ovarian cyst is 0.7 x 0.5 x 0.7 cm. Other findings No abnormal free fluid. IMPRESSION: 1. Thickened heterogeneous endometrium with cystic spaces. Endometrial thickness is considered abnormal for an asymptomatic post-menopausal female. Endometrial sampling should be considered to exclude carcinoma. 2. Right ovarian simple appearing cyst  measuring 2.3 cm. This is almost certainly benign, but follow up ultrasound is recommended in 1 year according to the Society of Radiologists in [place] Statement (D Clovis Riley et al. Management of Asymptomatic Ovarian and Other Adnexal Cysts Imaged at [place]: Society of Radiologists in [place] Statement 2010. Radiology 256 (Sept 2010): 943-954.). 3. Small left para ovarian cyst. Electronically Signed   By: Nolon Nations M.D.   On: 05/19/2016 15:01   US Pelvis Complete  Result Date: 05/19/2016 CLINICAL DATA:  Thickened endometrium on CT exam.  Postmenopausal. EXAM: TRANSABDOMINAL AND TRANSVAGINAL ULTRASOUND OF PELVIS TECHNIQUE: Both transabdominal and transvaginal ultrasound examinations of the pelvis were performed. Transabdominal technique was performed for global imaging of the pelvis including uterus, ovaries, adnexal regions, and pelvic cul-de-sac. It was necessary to proceed with endovaginal exam following the transabdominal exam to visualize the endometrium and left ovary. COMPARISON:  05/08/2016 CT FINDINGS: Uterus Measurements: 6.2 x 1.9 x 2.8 cm. No fibroids or other mass visualized. Endometrium Thickness: 13 mm.  Heterogeneous with cystic spaces and vascularity. Right ovary Measurements: 2.2 x 2.0 x 3.0 cm. Anechoic simple cyst is 2.3 x 1.5 x 2.0 cm Left ovary Measurements: 1.8 x 0.8 x 2.4 cm. Small  para ovarian cyst is 0.7 x 0.5 x 0.7 cm. Other findings No abnormal free fluid. IMPRESSION: 1. Thickened heterogeneous endometrium with cystic spaces. Endometrial thickness is considered abnormal for an asymptomatic post-menopausal female. Endometrial sampling should be considered to exclude carcinoma. 2. Right ovarian simple appearing cyst measuring 2.3 cm. This is almost certainly benign, but follow up ultrasound is recommended in 1 year according to the Society of Radiologists in [place] Statement (D Clovis Riley et al. Management of  Asymptomatic Ovarian and Other Adnexal Cysts Imaged at [place]: Society of Radiologists in [place] Statement 2010. Radiology 256 (Sept 2010): 943-954.). 3. Small left para ovarian cyst. Electronically Signed   By: Nolon Nations M.D.   On: 05/19/2016 15:01   Ct Abdomen Pelvis W Contrast  Result Date: 05/08/2016 CLINICAL DATA:  New diagnosis of breast cancer. EXAM: CT CHEST, ABDOMEN, AND PELVIS WITH CONTRAST TECHNIQUE: Multidetector CT imaging of the chest, abdomen and pelvis was performed following the standard protocol during bolus administration of intravenous contrast. CONTRAST:  143m ISOVUE-300 IOPAMIDOL (ISOVUE-300) INJECTION 61% COMPARISON:  None. FINDINGS: CT CHEST FINDINGS Cardiovascular: No significant vascular findings. Normal heart size. No pericardial effusion. Mediastinum/Nodes: No enlarged mediastinal, hilar, or axillary lymph nodes. Thyroid gland, trachea, and esophagus demonstrate no significant findings. There is a right retropectoral lymph node which measures 5 mm 6 mm, image 18 of series 2. Adjacent lymph node measures 8 mm, image 16 of series 2. No axillary or supraclavicular adenopathy. Lungs/Pleura: Lungs are clear. No pleural effusion or pneumothorax.Tiny nodule in the right upper lobe measures 2 mm, image 63 of series 4. Subpleural nodule in the right middle lobe measures 3 mm, image 99 of series 4. In the left lower lobe there is a parenchymal nodule measuring 3 mm, image 96 of series 4. Musculoskeletal: No suspicious bone lesions identified. Right breast lesion measures 1.7 cm, image 33 of series 2. CT ABDOMEN PELVIS FINDINGS Hepatobiliary: No focal liver abnormality is seen. No gallstones, gallbladder wall thickening, or biliary dilatation. Pancreas: Unremarkable. No pancreatic ductal dilatation or surrounding inflammatory changes. Spleen: Normal in size without focal abnormality. Adrenals/Urinary Tract: Adrenal glands are unremarkable. No mass or hydronephrosis.  Within the midpole of left kidney there is a 4 mm low-attenuation structure which is too small to reliably characterize. Bladder is unremarkable. Stomach/Bowel: The stomach is normal. The small bowel loops have a normal course and caliber. No bowel obstruction. Vascular/Lymphatic: Normal appearance of the abdominal aorta. No adenopathy within the upper abdomen or pelvis. Reproductive: The endometrium measures up to 1.4 cm, image 92 of series 7. No adnexal mass. Other: No abdominal wall hernia or abnormality. No abdominopelvic ascites. Musculoskeletal: No aggressive lytic or sclerotic bone lesions. IMPRESSION: 1. Right breast lesion identified compatible with the clinical history of new diagnosis of breast cancer. 2. There are several prominent, non pathologically enlarged right retropectoral lymph nodes. 3. There are a few small, nonspecific pulmonary nodules identified in both lungs measuring up to 3 mm. Attention on follow-up imaging is advised. 4. The endometrium appears to measure up to 1.4 cm. Endometrial thickness is considered abnormal for an asymptomatic post-menopausal female. Recommend further evaluation with pelvic sonogram for more definitive assessment of the endometrium prior to endometrial sampling. Electronically Signed   By: TKerby MoorsM.D.   On: 05/08/2016 16:37   Dg Chest 1 View  Result Date: 05/23/2016 CLINICAL DATA:  Central line placement EXAM: CHEST 1 VIEW COMPARISON:  CT chest 05/08/2016 FINDINGS: Left jugular Port-A-Cath tip in good position  at the cavoatrial junction. No pneumothorax. The lungs are clear. IMPRESSION: Satisfactory Port-A-Cath placement. Electronically Signed   By: Franchot Gallo M.D.   On: 05/23/2016 13:15   Dg C-arm 1-60 Min-no Report  Result Date: 05/23/2016 There is no Radiologist interpretation  for this exam.   ASSESSMENT: Clinical stage IIa ER/PR positive, HER-2 negative invasive carcinoma of the upper outer quadrant of the right breast.  PLAN:    1.  Clinical stage IIa ER/PR positive, HER-2 negative invasive carcinoma of the upper outer quadrant of the right breast: CT scan results reviewed independently and reported as above with no obvious metastatic disease. Patient has elected to proceed with neoadjuvant chemotherapy using Adriamycin, Cytoxan, and Taxol. Pretreatment MUGA scan revealed an EF of 74%. Patient received cycle 1 of 4 of dose dense Adriamycin and Cytoxan on 05/25/16. Patient also receiving OnPro Neulasta support. This will be followed by 12 cycles of weekly Taxol. Patient will require surgery and radiation after the completion of her chemotherapy. She will also benefit from an aromatase inhibitor for 5 years at the completion of all treatments.  Return to clinic in 1 week for evaluation and consideration of cycle 2.  2. Endometrial thickening: Patient was seen by gynecology oncology. Biopsy confirms no evidence of malignancy.  3. Anxiety/Insomia: Discussed 4-7-8 breathing technique at bedtime: breathe in to count of 4, hold breath for count of 7, exhale for count of 8; do 3-5 times for letting go of overactive thoughts. 4. Fatigue: Discussed energizing breathing techniques; provided breath ratio chart handout outlining relaxing, balancing, and energizing breathing techniques.  .  Patient expressed understanding and was in agreement with this plan. She also understands that She can call clinic at any time with any questions, concerns, or complaints.   Primary cancer of upper outer quadrant of right female breast [facility])   Staging form: Breast, AJCC 7th Edition   - Clinical stage from 04/26/2016: Stage IIA (T1c, N1, M0) - Signed by Lloyd Huger, MD on 05/25/2016   Lucendia Herrlich, NP  06/05/16 10:28 PM   Patient was seen and evaluated independently and I agree with the assessment and plan as dictated above. Patient tolerated her first treatment well without significant side effects. Return to clinic in 1 week as above consideration  of cycle 2.  Lloyd Huger, MD 06/05/16 10:28 PM

## 2016-06-08 ENCOUNTER — Inpatient Hospital Stay: Payer: 59

## 2016-06-08 ENCOUNTER — Inpatient Hospital Stay: Payer: 59 | Admitting: Oncology

## 2016-06-12 ENCOUNTER — Ambulatory Visit: Payer: Commercial Managed Care - HMO | Admitting: Family Medicine

## 2016-06-14 NOTE — Progress Notes (Signed)
[place]  [phone] [phone]) [phone]  ID: SHALA BAUMBACH OB: [birth year]  MR#: [id number]  NGE#:[id number]  [name], MD as PCP - General (Family Medicine) Clent Jacks, RN as Registered Nurse  CHIEF COMPLAINT: Clinical stage IIa ER/PR positive, HER-2 negative invasive carcinoma of the upper outer quadrant of the right breast.  INTERVAL HISTORY: Patient returns to clinic today for further evaluation and consideration of cycle 2 of Adriamycin and Cytoxan. She continues to be highly anxious, and reports occasional insomnia, but otherwise feels well. She has no neurologic complaints. She denies any recent fevers or illnesses. She has a good appetite and denies weight loss.  She has no chest pain or shortness of breath. She reports occasional nausea, controlled with zofran, and denies any vomiting or diarrhea. She reports occasional constipation, relieved with OTC FiberCon and Colace. She has no urinary complaints. Patient offers no further specific complaints today.   REVIEW OF SYSTEMS:   Review of Systems  Constitutional: Positive for malaise/fatigue. Negative for fever and weight loss.  HENT: Negative for congestion and sinus pain.   Respiratory: Negative.  Negative for cough and shortness of breath.   Cardiovascular: Negative for chest pain and leg swelling.  Gastrointestinal: Positive for constipation and nausea. Negative for abdominal pain, diarrhea and vomiting.  Genitourinary: Negative.  Negative for frequency and urgency.  Musculoskeletal: Negative.   Neurological: Positive for headaches. Negative for dizziness, tingling, sensory change and weakness.  Psychiatric/Behavioral: Positive for depression. The patient is nervous/anxious and has insomnia.     As per HPI. Otherwise, a complete review of systems is negative.  PAST MEDICAL HISTORY: Past Medical History:  Diagnosis Date  . Anxiety   . Cancer ([place])  04/24/2016   right breast  . HSV infection    HISTORY OF HSV  . PONV (postoperative nausea and vomiting)    nausea no vomiting  . Post-menopausal 10/20/2005  . Postmenopause bleeding   . Thickened endometrium 05/30/2016    PAST SURGICAL HISTORY: Past Surgical History:  Procedure Laterality Date  . ANAL FISTULECTOMY    . ENDOMETRIAL BIOPSY    . KNEE SURGERY     snow skating  . PORTACATH PLACEMENT N/A 05/23/2016   Procedure: INSERTION PORT-A-CATH;  Surgeon: Leonie Green, MD;  Location: [facility] ORS;  Service: General;  Laterality: N/A;    FAMILY HISTORY: Family History  Problem Relation Age of Onset  . Cancer Mother     BREAST AND BONES  . Breast cancer Mother 80  . Emphysema Father   . Cancer Father   . Cancer Maternal Aunt     ADVANCED DIRECTIVES (Y/N):  N  HEALTH MAINTENANCE: Social History  Substance Use Topics  . Smoking status: Never Smoker  . Smokeless tobacco: Never Used  . Alcohol use 3.6 - 4.8 oz/week    3 - 4 Glasses of wine, 3 - 4 Cans of beer per week     Colonoscopy:  PAP:  Bone density:  Lipid panel:  Allergies  Allergen Reactions  . Penicillins Anaphylaxis    unknown    Current Outpatient Prescriptions  Medication Sig Dispense Refill  . ALPRAZolam (XANAX) 0.25 MG tablet Take 1 tablet (0.25 mg total) by mouth at bedtime. (Patient taking differently: Take 0.25 mg by mouth at bedtime as needed. ) 30 tablet 0  . citalopram (CELEXA) 20 MG tablet Take 20 mg by mouth daily.     Marland Kitchen lidocaine-prilocaine (EMLA) cream Apply to affected area once  30 g 3  . ondansetron (ZOFRAN) 8 MG tablet Take 1 tablet (8 mg total) by mouth 2 (two) times daily as needed. Start on the third day after chemotherapy. 30 tablet 1  . prochlorperazine (COMPAZINE) 10 MG tablet Take 1 tablet (10 mg total) by mouth every 6 (six) hours as needed (Nausea or vomiting). 30 tablet 1   No current facility-administered medications for this visit.     OBJECTIVE: Vitals:   06/15/16  0905  BP: 122/88  Pulse: 80  Resp: 18  Temp: 97.6 F (36.4 C)     Body mass index is 24.74 kg/m.    ECOG FS:0 - Asymptomatic  General: Well-developed, well-nourished, no acute distress. Eyes: Pink conjunctiva, anicteric sclera. Breasts: Patient requested exam be deferred today. Lungs: Clear to auscultation bilaterally. Heart: Regular rate and rhythm. No rubs, murmurs, or gallops. Abdomen: Soft, nontender, nondistended. No organomegaly noted, normoactive bowel sounds. Musculoskeletal: No edema, cyanosis, or clubbing. Neuro: Alert, answering all questions appropriately. Cranial nerves grossly intact. Skin: No rashes or petechiae noted. Psych: Normal affect.   LAB RESULTS:  Lab Results  Component Value Date   NA 139 06/15/2016   K 3.6 06/15/2016   CL 106 06/15/2016   CO2 26 06/15/2016   GLUCOSE 114 (H) 06/15/2016   BUN 11 06/15/2016   CREATININE 0.79 06/15/2016   CALCIUM 9.1 06/15/2016   PROT 7.1 06/15/2016   ALBUMIN 4.1 06/15/2016   AST 31 06/15/2016   ALT 28 06/15/2016   ALKPHOS 76 06/15/2016   BILITOT 0.5 06/15/2016   GFRNONAA >60 06/15/2016   GFRAA >60 06/15/2016    Lab Results  Component Value Date   WBC 5.5 06/15/2016   NEUTROABS 3.2 06/15/2016   HGB 13.2 06/15/2016   HCT 38.6 06/15/2016   MCV 87.7 06/15/2016   PLT 362 06/15/2016     STUDIES: US Transvaginal Non-ob  Result Date: 05/19/2016 CLINICAL DATA:  Thickened endometrium on CT exam.  Postmenopausal. EXAM: TRANSABDOMINAL AND TRANSVAGINAL ULTRASOUND OF PELVIS TECHNIQUE: Both transabdominal and transvaginal ultrasound examinations of the pelvis were performed. Transabdominal technique was performed for global imaging of the pelvis including uterus, ovaries, adnexal regions, and pelvic cul-de-sac. It was necessary to proceed with endovaginal exam following the transabdominal exam to visualize the endometrium and left ovary. COMPARISON:  05/08/2016 CT FINDINGS: Uterus Measurements: 6.2 x 1.9 x 2.8 cm. No  fibroids or other mass visualized. Endometrium Thickness: 13 mm.  Heterogeneous with cystic spaces and vascularity. Right ovary Measurements: 2.2 x 2.0 x 3.0 cm. Anechoic simple cyst is 2.3 x 1.5 x 2.0 cm Left ovary Measurements: 1.8 x 0.8 x 2.4 cm. Small para ovarian cyst is 0.7 x 0.5 x 0.7 cm. Other findings No abnormal free fluid. IMPRESSION: 1. Thickened heterogeneous endometrium with cystic spaces. Endometrial thickness is considered abnormal for an asymptomatic post-menopausal female. Endometrial sampling should be considered to exclude carcinoma. 2. Right ovarian simple appearing cyst measuring 2.3 cm. This is almost certainly benign, but follow up ultrasound is recommended in 1 year according to the Society of Radiologists in [place] Statement (D Clovis Riley et al. Management of Asymptomatic Ovarian and Other Adnexal Cysts Imaged at [place]: Society of Radiologists in [place] Statement 2010. Radiology 256 (Sept 2010): 943-954.). 3. Small left para ovarian cyst. Electronically Signed   By: Nolon Nations M.D.   On: 05/19/2016 15:01   US Pelvis Complete  Result Date: 05/19/2016 CLINICAL DATA:  Thickened endometrium on CT exam.  Postmenopausal. EXAM: TRANSABDOMINAL AND TRANSVAGINAL ULTRASOUND  OF PELVIS TECHNIQUE: Both transabdominal and transvaginal ultrasound examinations of the pelvis were performed. Transabdominal technique was performed for global imaging of the pelvis including uterus, ovaries, adnexal regions, and pelvic cul-de-sac. It was necessary to proceed with endovaginal exam following the transabdominal exam to visualize the endometrium and left ovary. COMPARISON:  05/08/2016 CT FINDINGS: Uterus Measurements: 6.2 x 1.9 x 2.8 cm. No fibroids or other mass visualized. Endometrium Thickness: 13 mm.  Heterogeneous with cystic spaces and vascularity. Right ovary Measurements: 2.2 x 2.0 x 3.0 cm. Anechoic simple cyst is 2.3 x 1.5 x 2.0 cm Left ovary  Measurements: 1.8 x 0.8 x 2.4 cm. Small para ovarian cyst is 0.7 x 0.5 x 0.7 cm. Other findings No abnormal free fluid. IMPRESSION: 1. Thickened heterogeneous endometrium with cystic spaces. Endometrial thickness is considered abnormal for an asymptomatic post-menopausal female. Endometrial sampling should be considered to exclude carcinoma. 2. Right ovarian simple appearing cyst measuring 2.3 cm. This is almost certainly benign, but follow up ultrasound is recommended in 1 year according to the Society of Radiologists in [place] Statement (D Clovis Riley et al. Management of Asymptomatic Ovarian and Other Adnexal Cysts Imaged at [place]: Society of Radiologists in [place] Statement 2010. Radiology 256 (Sept 2010): 943-954.). 3. Small left para ovarian cyst. Electronically Signed   By: Nolon Nations M.D.   On: 05/19/2016 15:01   Dg Chest 1 View  Result Date: 05/23/2016 CLINICAL DATA:  Central line placement EXAM: CHEST 1 VIEW COMPARISON:  CT chest 05/08/2016 FINDINGS: Left jugular Port-A-Cath tip in good position at the cavoatrial junction. No pneumothorax. The lungs are clear. IMPRESSION: Satisfactory Port-A-Cath placement. Electronically Signed   By: Franchot Gallo M.D.   On: 05/23/2016 13:15   Dg C-arm 1-60 Min-no Report  Result Date: 05/23/2016 There is no Radiologist interpretation  for this exam.   ASSESSMENT: Clinical stage IIa ER/PR positive, HER-2 negative invasive carcinoma of the upper outer quadrant of the right breast.  PLAN:    1. Clinical stage IIa ER/PR positive, HER-2 negative invasive carcinoma of the upper outer quadrant of the right breast: CT scan results reviewed independently and reported as above with no obvious metastatic disease. Patient has elected to proceed with neoadjuvant chemotherapy using Adriamycin, Cytoxan, and Taxol. Pretreatment MUGA scan revealed an EF of 74%. Proceed with cycle 2 of 4 of dose dense Adriamycin and Cytoxan  today. Patient also receiving OnPro Neulasta support. This will be followed by 12 cycles of weekly Taxol. Patient will require surgery and radiation after the completion of her chemotherapy. She will also benefit from an aromatase inhibitor for 5 years at the completion of all treatments.  Return to clinic in 2 weeks for consideration of cycle 3.  2. Endometrial thickening: Patient was seen by gynecology oncology. Biopsy confirms no evidence of malignancy.    Patient expressed understanding and was in agreement with this plan. She also understands that She can call clinic at any time with any questions, concerns, or complaints.   Cancer Staging Primary cancer of upper outer quadrant of right female breast [facility]) Staging form: Breast, AJCC 7th Edition - Clinical stage from 04/26/2016: Stage IIA (T1c, N1, M0) - Signed by Lloyd Huger, MD on 05/25/2016    Lloyd Huger, MD 06/18/16 11:15 AM

## 2016-06-15 ENCOUNTER — Inpatient Hospital Stay: Payer: 59

## 2016-06-15 ENCOUNTER — Encounter: Payer: Self-pay | Admitting: *Deleted

## 2016-06-15 ENCOUNTER — Inpatient Hospital Stay (HOSPITAL_BASED_OUTPATIENT_CLINIC_OR_DEPARTMENT_OTHER): Payer: 59 | Admitting: Oncology

## 2016-06-15 VITALS — BP 122/88 | HR 80 | Temp 97.6°F | Resp 18 | Wt 162.7 lb

## 2016-06-15 DIAGNOSIS — R51 Headache: Secondary | ICD-10-CM

## 2016-06-15 DIAGNOSIS — Z7689 Persons encountering health services in other specified circumstances: Secondary | ICD-10-CM

## 2016-06-15 DIAGNOSIS — Z8619 Personal history of other infectious and parasitic diseases: Secondary | ICD-10-CM

## 2016-06-15 DIAGNOSIS — N83202 Unspecified ovarian cyst, left side: Secondary | ICD-10-CM

## 2016-06-15 DIAGNOSIS — Z170 Estrogen receptor positive status [ER+]: Secondary | ICD-10-CM | POA: Diagnosis not present

## 2016-06-15 DIAGNOSIS — C50411 Malignant neoplasm of upper-outer quadrant of right female breast: Secondary | ICD-10-CM | POA: Diagnosis not present

## 2016-06-15 DIAGNOSIS — Z803 Family history of malignant neoplasm of breast: Secondary | ICD-10-CM

## 2016-06-15 DIAGNOSIS — N83201 Unspecified ovarian cyst, right side: Secondary | ICD-10-CM

## 2016-06-15 DIAGNOSIS — R938 Abnormal findings on diagnostic imaging of other specified body structures: Secondary | ICD-10-CM | POA: Diagnosis not present

## 2016-06-15 DIAGNOSIS — R599 Enlarged lymph nodes, unspecified: Secondary | ICD-10-CM

## 2016-06-15 DIAGNOSIS — F329 Major depressive disorder, single episode, unspecified: Secondary | ICD-10-CM

## 2016-06-15 DIAGNOSIS — R918 Other nonspecific abnormal finding of lung field: Secondary | ICD-10-CM

## 2016-06-15 DIAGNOSIS — R5383 Other fatigue: Secondary | ICD-10-CM

## 2016-06-15 DIAGNOSIS — Z79899 Other long term (current) drug therapy: Secondary | ICD-10-CM

## 2016-06-15 DIAGNOSIS — K5900 Constipation, unspecified: Secondary | ICD-10-CM

## 2016-06-15 DIAGNOSIS — B009 Herpesviral infection, unspecified: Secondary | ICD-10-CM

## 2016-06-15 DIAGNOSIS — Z780 Asymptomatic menopausal state: Secondary | ICD-10-CM

## 2016-06-15 DIAGNOSIS — Z809 Family history of malignant neoplasm, unspecified: Secondary | ICD-10-CM

## 2016-06-15 DIAGNOSIS — R531 Weakness: Secondary | ICD-10-CM

## 2016-06-15 DIAGNOSIS — G4700 Insomnia, unspecified: Secondary | ICD-10-CM

## 2016-06-15 DIAGNOSIS — F419 Anxiety disorder, unspecified: Secondary | ICD-10-CM

## 2016-06-15 LAB — CBC WITH DIFFERENTIAL/PLATELET
BASOS PCT: 1 %
Basophils Absolute: 0.1 10*3/uL (ref 0–0.1)
EOS ABS: 0 10*3/uL (ref 0–0.7)
Eosinophils Relative: 0 %
HEMATOCRIT: 38.6 % (ref 35.0–47.0)
HEMOGLOBIN: 13.2 g/dL (ref 12.0–16.0)
LYMPHS ABS: 1.5 10*3/uL (ref 1.0–3.6)
Lymphocytes Relative: 27 %
MCH: 29.9 pg (ref 26.0–34.0)
MCHC: 34.1 g/dL (ref 32.0–36.0)
MCV: 87.7 fL (ref 80.0–100.0)
MONOS PCT: 14 %
Monocytes Absolute: 0.8 10*3/uL (ref 0.2–0.9)
NEUTROS ABS: 3.2 10*3/uL (ref 1.4–6.5)
NEUTROS PCT: 58 %
Platelets: 362 10*3/uL (ref 150–440)
RBC: 4.41 MIL/uL (ref 3.80–5.20)
RDW: 12.3 % (ref 11.5–14.5)
WBC: 5.5 10*3/uL (ref 3.6–11.0)

## 2016-06-15 LAB — COMPREHENSIVE METABOLIC PANEL
ALBUMIN: 4.1 g/dL (ref 3.5–5.0)
ALK PHOS: 76 U/L (ref 38–126)
ALT: 28 U/L (ref 14–54)
AST: 31 U/L (ref 15–41)
Anion gap: 7 (ref 5–15)
BILIRUBIN TOTAL: 0.5 mg/dL (ref 0.3–1.2)
BUN: 11 mg/dL (ref 6–20)
CALCIUM: 9.1 mg/dL (ref 8.9–10.3)
CO2: 26 mmol/L (ref 22–32)
CREATININE: 0.79 mg/dL (ref 0.44–1.00)
Chloride: 106 mmol/L (ref 101–111)
GFR calc Af Amer: >60 mL/min (ref >60)
GFR calc non Af Amer: >60 mL/min (ref >60)
GLUCOSE: 114 mg/dL — AB (ref 65–99)
Potassium: 3.6 mmol/L (ref 3.5–5.1)
SODIUM: 139 mmol/L (ref 135–145)
TOTAL PROTEIN: 7.1 g/dL (ref 6.5–8.1)

## 2016-06-15 MED ORDER — PEGFILGRASTIM 6 MG/0.6ML ~~LOC~~ PSKT
6.0000 mg | PREFILLED_SYRINGE | Freq: Once | SUBCUTANEOUS | Status: AC
  Administered 2016-06-15: 6 mg via SUBCUTANEOUS
  Filled 2016-06-15: qty 0.6

## 2016-06-15 MED ORDER — HEPARIN SOD (PORK) LOCK FLUSH 100 UNIT/ML IV SOLN
500.0000 [IU] | Freq: Once | INTRAVENOUS | Status: AC | PRN
  Administered 2016-06-15: 500 [IU]
  Filled 2016-06-15: qty 5

## 2016-06-15 MED ORDER — SODIUM CHLORIDE 0.9 % IV SOLN
Freq: Once | INTRAVENOUS | Status: AC
  Administered 2016-06-15: 10:00:00 via INTRAVENOUS
  Filled 2016-06-15: qty 1000

## 2016-06-15 MED ORDER — DOXORUBICIN HCL CHEMO IV INJECTION 2 MG/ML
60.0000 mg/m2 | Freq: Once | INTRAVENOUS | Status: AC
  Administered 2016-06-15: 112 mg via INTRAVENOUS
  Filled 2016-06-15: qty 56

## 2016-06-15 MED ORDER — SODIUM CHLORIDE 0.9% FLUSH
10.0000 mL | INTRAVENOUS | Status: DC | PRN
  Administered 2016-06-15: 10 mL
  Filled 2016-06-15: qty 10

## 2016-06-15 MED ORDER — CYCLOPHOSPHAMIDE CHEMO INJECTION 1 GM
600.0000 mg/m2 | Freq: Once | INTRAMUSCULAR | Status: AC
  Administered 2016-06-15: 1120 mg via INTRAVENOUS
  Filled 2016-06-15: qty 50

## 2016-06-15 MED ORDER — PALONOSETRON HCL INJECTION 0.25 MG/5ML
0.2500 mg | Freq: Once | INTRAVENOUS | Status: AC
  Administered 2016-06-15: 0.25 mg via INTRAVENOUS
  Filled 2016-06-15: qty 5

## 2016-06-15 MED ORDER — SODIUM CHLORIDE 0.9 % IV SOLN
Freq: Once | INTRAVENOUS | Status: AC
  Administered 2016-06-15: 10:00:00 via INTRAVENOUS
  Filled 2016-06-15: qty 5

## 2016-06-15 NOTE — Progress Notes (Signed)
Patient is here for follow up, she is doing well 

## 2016-06-15 NOTE — Progress Notes (Signed)
  Oncology Nurse Navigator Documentation  Navigator Location: [facility] (06/15/16 1100)   )Navigator Encounter Type: Treatment (06/15/16 1100)   Met patient today during her chemotherapy treatment.  I have given her the eyebrows she ordered from our boutique.  No other needs today.                                                    Time Spent with Patient: 30 (06/15/16 1100)

## 2016-06-28 NOTE — Progress Notes (Signed)
[place]  [phone]) [phone] [phone]) [phone]  ID: EDDIS PINGLETON OB: [birth year]  MR#: [id number]  UGQ#:[id number]  [name], MD as PCP - General (Family Medicine) Clent Jacks, RN as Registered Nurse  CHIEF COMPLAINT: Clinical stage IIa ER/PR positive, HER-2 negative invasive carcinoma of the upper outer quadrant of the right breast.  INTERVAL HISTORY: Patient returns to clinic today for further evaluation and consideration of cycle 3 of Adriamycin and Cytoxan. She continues to be highly anxious and reports occasional insomnia, but otherwise feels well. She has mild congestion today. She has no neurologic complaints. She denies any recent fevers or illnesses. She has a good appetite and denies weight loss.  She has no chest pain or shortness of breath. She reports occasional nausea, controlled with zofran, and denies any vomiting or diarrhea. She reports occasional constipation, relieved with OTC FiberCon and Colace. She has no urinary complaints. Patient offers no further specific complaints today.   REVIEW OF SYSTEMS:   Review of Systems  Constitutional: Positive for malaise/fatigue. Negative for fever and weight loss.  HENT: Positive for congestion. Negative for sinus pain.   Respiratory: Negative.  Negative for cough and shortness of breath.   Cardiovascular: Negative for chest pain and leg swelling.  Gastrointestinal: Positive for constipation and nausea. Negative for abdominal pain, diarrhea and vomiting.  Genitourinary: Negative.  Negative for frequency and urgency.  Musculoskeletal: Negative.   Neurological: Negative for dizziness, tingling, sensory change, weakness and headaches.  Psychiatric/Behavioral: Negative for depression. The patient is nervous/anxious. The patient does not have insomnia.     As per HPI. Otherwise, a complete review of systems is negative.  PAST MEDICAL HISTORY: Past Medical History:    Diagnosis Date  . Anxiety   . Cancer ([place]) 04/24/2016   right breast  . HSV infection    HISTORY OF HSV  . PONV (postoperative nausea and vomiting)    nausea no vomiting  . Post-menopausal 10/20/2005  . Postmenopause bleeding   . Thickened endometrium 05/30/2016    PAST SURGICAL HISTORY: Past Surgical History:  Procedure Laterality Date  . ANAL FISTULECTOMY    . ENDOMETRIAL BIOPSY    . KNEE SURGERY     snow skating  . PORTACATH PLACEMENT N/A 05/23/2016   Procedure: INSERTION PORT-A-CATH;  Surgeon: Leonie Green, MD;  Location: [facility] ORS;  Service: General;  Laterality: N/A;    FAMILY HISTORY: Family History  Problem Relation Age of Onset  . Cancer Mother     BREAST AND BONES  . Breast cancer Mother 22  . Emphysema Father   . Cancer Father   . Cancer Maternal Aunt     ADVANCED DIRECTIVES (Y/N):  N  HEALTH MAINTENANCE: Social History  Substance Use Topics  . Smoking status: Never Smoker  . Smokeless tobacco: Never Used  . Alcohol use 3.6 - 4.8 oz/week    3 - 4 Glasses of wine, 3 - 4 Cans of beer per week     Colonoscopy:  PAP:  Bone density:  Lipid panel:  Allergies  Allergen Reactions  . Penicillins Anaphylaxis    unknown    Current Outpatient Prescriptions  Medication Sig Dispense Refill  . ALPRAZolam (XANAX) 0.25 MG tablet Take 1 tablet (0.25 mg total) by mouth at bedtime. (Patient taking differently: Take 0.25 mg by mouth at bedtime as needed. ) 30 tablet 0  . citalopram (CELEXA) 20 MG tablet Take 20 mg by mouth daily.     Marland Kitchen  lidocaine-prilocaine (EMLA) cream Apply to affected area once 30 g 3  . ondansetron (ZOFRAN) 8 MG tablet Take 1 tablet (8 mg total) by mouth 2 (two) times daily as needed. Start on the third day after chemotherapy. 30 tablet 1  . prochlorperazine (COMPAZINE) 10 MG tablet Take 1 tablet (10 mg total) by mouth every 6 (six) hours as needed (Nausea or vomiting). 30 tablet 1   No current facility-administered medications for this  visit.    Facility-Administered Medications Ordered in Other Visits  Medication Dose Route Frequency Provider Last Rate Last Dose  . heparin lock flush 100 unit/mL  500 Units Intravenous Once Lloyd Huger, MD      . sodium chloride flush (NS) 0.9 % injection 10 mL  10 mL Intravenous PRN Lloyd Huger, MD   10 mL at 06/29/16 0946    OBJECTIVE: Vitals:   06/29/16 0957  BP: 127/85  Pulse: 83  Resp: 18  Temp: 97.9 F (36.6 C)     Body mass index is 25.16 kg/m.    ECOG FS:0 - Asymptomatic  General: Well-developed, well-nourished, no acute distress. Eyes: Pink conjunctiva, anicteric sclera. Breasts: Patient requested exam be deferred today. Lungs: Clear to auscultation bilaterally. Heart: Regular rate and rhythm. No rubs, murmurs, or gallops. Abdomen: Soft, nontender, nondistended. No organomegaly noted, normoactive bowel sounds. Musculoskeletal: No edema, cyanosis, or clubbing. Neuro: Alert, answering all questions appropriately. Cranial nerves grossly intact. Skin: No rashes or petechiae noted. Psych: Normal affect.   LAB RESULTS:  Lab Results  Component Value Date   NA 137 06/29/2016   K 4.2 06/29/2016   CL 106 06/29/2016   CO2 26 06/29/2016   GLUCOSE 91 06/29/2016   BUN 14 06/29/2016   CREATININE 0.66 06/29/2016   CALCIUM 9.1 06/29/2016   PROT 7.0 06/29/2016   ALBUMIN 3.9 06/29/2016   AST 35 06/29/2016   ALT 52 06/29/2016   ALKPHOS 134 (H) 06/29/2016   BILITOT 0.4 06/29/2016   GFRNONAA >60 06/29/2016   GFRAA >60 06/29/2016    Lab Results  Component Value Date   WBC 7.8 06/29/2016   NEUTROABS 5.6 06/29/2016   HGB 12.6 06/29/2016   HCT 36.2 06/29/2016   MCV 87.1 06/29/2016   PLT 242 06/29/2016     STUDIES: No results found.  ASSESSMENT: Clinical stage IIa ER/PR positive, HER-2 negative invasive carcinoma of the upper outer quadrant of the right breast.  PLAN:    1. Clinical stage IIa ER/PR positive, HER-2 negative invasive carcinoma of the  upper outer quadrant of the right breast: CT scan results reviewed independently with no obvious metastatic disease. Patient has elected to proceed with neoadjuvant chemotherapy using Adriamycin, Cytoxan, and Taxol. Pretreatment MUGA scan revealed an EF of 74%. Proceed with cycle 3 of 4 of dose dense Adriamycin and Cytoxan today. Patient also receiving OnPro Neulasta support. This will be followed by 12 cycles of weekly Taxol. Patient will require surgery and radiation after the completion of her chemotherapy. She will also benefit from an aromatase inhibitor for 5 years at the completion of all treatments.  Return to clinic in 2 weeks for consideration of cycle 4.  2. Endometrial thickening: Patient was seen by gynecology oncology. Biopsy confirms no evidence of malignancy.  3. Congestion: Continue OTC treatments. 4. Constipation: Continue OTC treatments as needed.   Patient expressed understanding and was in agreement with this plan. She also understands that She can call clinic at any time with any questions, concerns, or complaints.  Cancer Staging Primary cancer of upper outer quadrant of right female breast [facility]) Staging form: Breast, AJCC 7th Edition - Clinical stage from 04/26/2016: Stage IIA (T1c, N1, M0) - Signed by Lloyd Huger, MD on 05/25/2016    Lloyd Huger, MD 06/29/16 10:03 AM

## 2016-06-29 ENCOUNTER — Inpatient Hospital Stay: Payer: 59

## 2016-06-29 ENCOUNTER — Inpatient Hospital Stay: Payer: 59 | Attending: Oncology | Admitting: Oncology

## 2016-06-29 VITALS — BP 127/85 | HR 83 | Temp 97.9°F | Resp 18 | Wt 165.5 lb

## 2016-06-29 DIAGNOSIS — F419 Anxiety disorder, unspecified: Secondary | ICD-10-CM | POA: Insufficient documentation

## 2016-06-29 DIAGNOSIS — Z79899 Other long term (current) drug therapy: Secondary | ICD-10-CM | POA: Diagnosis not present

## 2016-06-29 DIAGNOSIS — R938 Abnormal findings on diagnostic imaging of other specified body structures: Secondary | ICD-10-CM | POA: Diagnosis not present

## 2016-06-29 DIAGNOSIS — R0981 Nasal congestion: Secondary | ICD-10-CM | POA: Insufficient documentation

## 2016-06-29 DIAGNOSIS — G4700 Insomnia, unspecified: Secondary | ICD-10-CM | POA: Diagnosis not present

## 2016-06-29 DIAGNOSIS — Z5111 Encounter for antineoplastic chemotherapy: Secondary | ICD-10-CM | POA: Insufficient documentation

## 2016-06-29 DIAGNOSIS — K5900 Constipation, unspecified: Secondary | ICD-10-CM | POA: Insufficient documentation

## 2016-06-29 DIAGNOSIS — C50411 Malignant neoplasm of upper-outer quadrant of right female breast: Secondary | ICD-10-CM

## 2016-06-29 DIAGNOSIS — R110 Nausea: Secondary | ICD-10-CM | POA: Diagnosis not present

## 2016-06-29 DIAGNOSIS — Z809 Family history of malignant neoplasm, unspecified: Secondary | ICD-10-CM | POA: Insufficient documentation

## 2016-06-29 DIAGNOSIS — R531 Weakness: Secondary | ICD-10-CM | POA: Insufficient documentation

## 2016-06-29 DIAGNOSIS — Z7689 Persons encountering health services in other specified circumstances: Secondary | ICD-10-CM | POA: Diagnosis not present

## 2016-06-29 DIAGNOSIS — Z170 Estrogen receptor positive status [ER+]: Secondary | ICD-10-CM | POA: Insufficient documentation

## 2016-06-29 DIAGNOSIS — Z803 Family history of malignant neoplasm of breast: Secondary | ICD-10-CM | POA: Diagnosis not present

## 2016-06-29 DIAGNOSIS — R5383 Other fatigue: Secondary | ICD-10-CM | POA: Diagnosis not present

## 2016-06-29 LAB — CBC WITH DIFFERENTIAL/PLATELET
BASOS ABS: 0 10*3/uL (ref 0–0.1)
Basophils Relative: 0 %
EOS PCT: 1 %
Eosinophils Absolute: 0 10*3/uL (ref 0–0.7)
HCT: 36.2 % (ref 35.0–47.0)
HEMOGLOBIN: 12.6 g/dL (ref 12.0–16.0)
LYMPHS ABS: 1.3 10*3/uL (ref 1.0–3.6)
LYMPHS PCT: 16 %
MCH: 30.3 pg (ref 26.0–34.0)
MCHC: 34.7 g/dL (ref 32.0–36.0)
MCV: 87.1 fL (ref 80.0–100.0)
Monocytes Absolute: 0.8 10*3/uL (ref 0.2–0.9)
Monocytes Relative: 11 %
NEUTROS PCT: 72 %
Neutro Abs: 5.6 10*3/uL (ref 1.4–6.5)
PLATELETS: 242 10*3/uL (ref 150–440)
RBC: 4.15 MIL/uL (ref 3.80–5.20)
RDW: 12.6 % (ref 11.5–14.5)
WBC: 7.8 10*3/uL (ref 3.6–11.0)

## 2016-06-29 LAB — COMPREHENSIVE METABOLIC PANEL
ALK PHOS: 134 U/L — AB (ref 38–126)
ALT: 52 U/L (ref 14–54)
AST: 35 U/L (ref 15–41)
Albumin: 3.9 g/dL (ref 3.5–5.0)
Anion gap: 5 (ref 5–15)
BUN: 14 mg/dL (ref 6–20)
CALCIUM: 9.1 mg/dL (ref 8.9–10.3)
CHLORIDE: 106 mmol/L (ref 101–111)
CO2: 26 mmol/L (ref 22–32)
CREATININE: 0.66 mg/dL (ref 0.44–1.00)
GFR calc Af Amer: >60 mL/min (ref >60)
Glucose, Bld: 91 mg/dL (ref 65–99)
Potassium: 4.2 mmol/L (ref 3.5–5.1)
Sodium: 137 mmol/L (ref 135–145)
Total Bilirubin: 0.4 mg/dL (ref 0.3–1.2)
Total Protein: 7 g/dL (ref 6.5–8.1)

## 2016-06-29 MED ORDER — SODIUM CHLORIDE 0.9 % IV SOLN
Freq: Once | INTRAVENOUS | Status: AC
  Administered 2016-06-29: 11:00:00 via INTRAVENOUS
  Filled 2016-06-29: qty 1000

## 2016-06-29 MED ORDER — SODIUM CHLORIDE 0.9 % IV SOLN
Freq: Once | INTRAVENOUS | Status: AC
  Administered 2016-06-29: 11:00:00 via INTRAVENOUS
  Filled 2016-06-29: qty 5

## 2016-06-29 MED ORDER — PALONOSETRON HCL INJECTION 0.25 MG/5ML
0.2500 mg | Freq: Once | INTRAVENOUS | Status: AC
  Administered 2016-06-29: 0.25 mg via INTRAVENOUS
  Filled 2016-06-29: qty 5

## 2016-06-29 MED ORDER — HEPARIN SOD (PORK) LOCK FLUSH 100 UNIT/ML IV SOLN
500.0000 [IU] | Freq: Once | INTRAVENOUS | Status: DC | PRN
  Filled 2016-06-29: qty 5

## 2016-06-29 MED ORDER — HEPARIN SOD (PORK) LOCK FLUSH 100 UNIT/ML IV SOLN
500.0000 [IU] | Freq: Once | INTRAVENOUS | Status: AC
  Administered 2016-06-29: 500 [IU] via INTRAVENOUS

## 2016-06-29 MED ORDER — SODIUM CHLORIDE 0.9 % IV SOLN
600.0000 mg/m2 | Freq: Once | INTRAVENOUS | Status: AC
  Administered 2016-06-29: 1120 mg via INTRAVENOUS
  Filled 2016-06-29: qty 6

## 2016-06-29 MED ORDER — PEGFILGRASTIM 6 MG/0.6ML ~~LOC~~ PSKT
6.0000 mg | PREFILLED_SYRINGE | Freq: Once | SUBCUTANEOUS | Status: AC
  Administered 2016-06-29: 6 mg via SUBCUTANEOUS
  Filled 2016-06-29: qty 0.6

## 2016-06-29 MED ORDER — SODIUM CHLORIDE 0.9% FLUSH
10.0000 mL | INTRAVENOUS | Status: DC | PRN
  Administered 2016-06-29: 10 mL via INTRAVENOUS
  Filled 2016-06-29: qty 10

## 2016-06-29 MED ORDER — DOXORUBICIN HCL CHEMO IV INJECTION 2 MG/ML
60.0000 mg/m2 | Freq: Once | INTRAVENOUS | Status: AC
Start: 2016-06-29 — End: 2016-06-29
  Administered 2016-06-29: 112 mg via INTRAVENOUS
  Filled 2016-06-29: qty 56

## 2016-06-29 NOTE — Progress Notes (Signed)
Complains of nasal congestion, sore throat. Pt has not had any fevers and states is recovering from recent cold.

## 2016-07-12 NOTE — Progress Notes (Signed)
[place]  Telephone:(336) [phone] Fax:(336) [phone]  ID: Kimberly Clarke OB: 1955-07-21  MR#: [id number]  ZES#:[id number]  [name], MD as PCP - General (Family Medicine) Clent Jacks, RN as Registered Nurse  CHIEF COMPLAINT: Clinical stage IIa ER/PR positive, HER-2 negative invasive carcinoma of the upper outer quadrant of the right breast.  INTERVAL HISTORY: Patient returns to clinic today for further evaluation and consideration of cycle 4 of Adriamycin and Cytoxan. She continues to be highly anxious and reports occasional insomnia, but otherwise feels well. She has no neurologic complaints. She denies any recent fevers or illnesses. She has a good appetite and denies weight loss.  She has no chest pain or shortness of breath. She reports occasional nausea, controlled with zofran, and denies any vomiting or diarrhea. She reports occasional constipation, relieved with OTC FiberCon and Colace. She has no urinary complaints. Patient offers no further specific complaints today.   REVIEW OF SYSTEMS:   Review of Systems  Constitutional: Positive for malaise/fatigue. Negative for fever and weight loss.  HENT: Positive for congestion. Negative for sinus pain.   Respiratory: Negative.  Negative for cough and shortness of breath.   Cardiovascular: Negative for chest pain and leg swelling.  Gastrointestinal: Positive for constipation and nausea. Negative for abdominal pain, diarrhea and vomiting.  Genitourinary: Negative.  Negative for frequency and urgency.  Musculoskeletal: Negative.   Neurological: Negative for dizziness, tingling, sensory change, weakness and headaches.  Psychiatric/Behavioral: Negative for depression. The patient is nervous/anxious. The patient does not have insomnia.     As per HPI. Otherwise, a complete review of systems is negative.  PAST MEDICAL HISTORY: Past Medical History:  Diagnosis Date  . Anxiety   .  Cancer ([place]) 04/24/2016   right breast  . HSV infection    HISTORY OF HSV  . PONV (postoperative nausea and vomiting)    nausea no vomiting  . Post-menopausal 10/20/2005  . Postmenopause bleeding   . Thickened endometrium 05/30/2016    PAST SURGICAL HISTORY: Past Surgical History:  Procedure Laterality Date  . ANAL FISTULECTOMY    . ENDOMETRIAL BIOPSY    . KNEE SURGERY     snow skating  . PORTACATH PLACEMENT N/A 05/23/2016   Procedure: INSERTION PORT-A-CATH;  Surgeon: Leonie Green, MD;  Location: [facility] ORS;  Service: General;  Laterality: N/A;    FAMILY HISTORY: Family History  Problem Relation Age of Onset  . Cancer Mother     BREAST AND BONES  . Breast cancer Mother 47  . Emphysema Father   . Cancer Father   . Cancer Maternal Aunt     ADVANCED DIRECTIVES (Y/N):  N  HEALTH MAINTENANCE: Social History  Substance Use Topics  . Smoking status: Never Smoker  . Smokeless tobacco: Never Used  . Alcohol use 3.6 - 4.8 oz/week    3 - 4 Glasses of wine, 3 - 4 Cans of beer per week     Colonoscopy:  PAP:  Bone density:  Lipid panel:  Allergies  Allergen Reactions  . Penicillins Anaphylaxis    unknown    Current Outpatient Prescriptions  Medication Sig Dispense Refill  . ALPRAZolam (XANAX) 0.25 MG tablet Take 1 tablet (0.25 mg total) by mouth at bedtime. (Patient taking differently: Take 0.25 mg by mouth at bedtime as needed. ) 30 tablet 0  . citalopram (CELEXA) 20 MG tablet Take 20 mg by mouth daily.     Marland Kitchen lidocaine-prilocaine (EMLA) cream Apply to affected  area once 30 g 3  . ondansetron (ZOFRAN) 8 MG tablet Take 1 tablet (8 mg total) by mouth 2 (two) times daily as needed. Start on the third day after chemotherapy. 30 tablet 1  . prochlorperazine (COMPAZINE) 10 MG tablet Take 1 tablet (10 mg total) by mouth every 6 (six) hours as needed (Nausea or vomiting). 30 tablet 1   No current facility-administered medications for this visit.     OBJECTIVE: Vitals:     07/13/16 1031  BP: 112/71  Pulse: (!) 111  Temp: 98.6 F (37 C)     Body mass index is 24.87 kg/m.    ECOG FS:0 - Asymptomatic  General: Well-developed, well-nourished, no acute distress. Eyes: Pink conjunctiva, anicteric sclera. Breasts: Patient requested exam be deferred today. Lungs: Clear to auscultation bilaterally. Heart: Regular rate and rhythm. No rubs, murmurs, or gallops. Abdomen: Soft, nontender, nondistended. No organomegaly noted, normoactive bowel sounds. Musculoskeletal: No edema, cyanosis, or clubbing. Neuro: Alert, answering all questions appropriately. Cranial nerves grossly intact. Skin: No rashes or petechiae noted. Psych: Normal affect.   LAB RESULTS:  Lab Results  Component Value Date   NA 138 07/13/2016   K 3.8 07/13/2016   CL 108 07/13/2016   CO2 26 07/13/2016   GLUCOSE 106 (H) 07/13/2016   BUN 12 07/13/2016   CREATININE 0.68 07/13/2016   CALCIUM 8.8 (L) 07/13/2016   PROT 6.9 07/13/2016   ALBUMIN 3.9 07/13/2016   AST 24 07/13/2016   ALT 24 07/13/2016   ALKPHOS 114 07/13/2016   BILITOT 0.5 07/13/2016   GFRNONAA >60 07/13/2016   GFRAA >60 07/13/2016    Lab Results  Component Value Date   WBC 8.7 07/13/2016   NEUTROABS 6.6 (H) 07/13/2016   HGB 11.3 (L) 07/13/2016   HCT 32.6 (L) 07/13/2016   MCV 86.9 07/13/2016   PLT 273 07/13/2016     STUDIES: No results found.  ASSESSMENT: Clinical stage IIa ER/PR positive, HER-2 negative invasive carcinoma of the upper outer quadrant of the right breast.  PLAN:    1. Clinical stage IIa ER/PR positive, HER-2 negative invasive carcinoma of the upper outer quadrant of the right breast: CT scan results reviewed independently with no obvious metastatic disease. Patient has elected to proceed with neoadjuvant chemotherapy using Adriamycin, Cytoxan, and Taxol. Pretreatment MUGA scan revealed an EF of 74%. Proceed with cycle 4 of 4 of dose dense Adriamycin and Cytoxan today. Patient also receiving OnPro  Neulasta support. This will be followed by 12 cycles of weekly Taxol. Patient will require surgery and radiation after the completion of her chemotherapy. She will also benefit from an aromatase inhibitor for 5 years at the completion of all treatments.  Return to clinic in 2 weeks for consideration of cycle 1 of 12 of weekly Taxol.  2. Endometrial thickening: Patient was seen by gynecology oncology. Biopsy confirms no evidence of malignancy.  3. Congestion: Continue OTC treatments. 4. Constipation: Continue OTC treatments as needed.   Patient expressed understanding and was in agreement with this plan. She also understands that She can call clinic at any time with any questions, concerns, or complaints.   Cancer Staging Primary cancer of upper outer quadrant of right female breast [facility]) Staging form: Breast, AJCC 7th Edition - Clinical stage from 04/26/2016: Stage IIA (T1c, N1, M0) - Signed by Lloyd Huger, MD on 05/25/2016    Lloyd Huger, MD 07/17/16 8:13 AM

## 2016-07-13 ENCOUNTER — Inpatient Hospital Stay: Payer: 59

## 2016-07-13 ENCOUNTER — Encounter: Payer: Self-pay | Admitting: Oncology

## 2016-07-13 ENCOUNTER — Inpatient Hospital Stay (HOSPITAL_BASED_OUTPATIENT_CLINIC_OR_DEPARTMENT_OTHER): Payer: 59 | Admitting: Oncology

## 2016-07-13 ENCOUNTER — Encounter: Payer: Self-pay | Admitting: *Deleted

## 2016-07-13 VITALS — BP 112/71 | HR 111 | Temp 98.6°F | Wt 163.6 lb

## 2016-07-13 DIAGNOSIS — C50411 Malignant neoplasm of upper-outer quadrant of right female breast: Secondary | ICD-10-CM

## 2016-07-13 DIAGNOSIS — Z803 Family history of malignant neoplasm of breast: Secondary | ICD-10-CM

## 2016-07-13 DIAGNOSIS — G4700 Insomnia, unspecified: Secondary | ICD-10-CM | POA: Diagnosis not present

## 2016-07-13 DIAGNOSIS — Z809 Family history of malignant neoplasm, unspecified: Secondary | ICD-10-CM

## 2016-07-13 DIAGNOSIS — R110 Nausea: Secondary | ICD-10-CM | POA: Diagnosis not present

## 2016-07-13 DIAGNOSIS — R5383 Other fatigue: Secondary | ICD-10-CM | POA: Diagnosis not present

## 2016-07-13 DIAGNOSIS — F419 Anxiety disorder, unspecified: Secondary | ICD-10-CM | POA: Diagnosis not present

## 2016-07-13 DIAGNOSIS — K5900 Constipation, unspecified: Secondary | ICD-10-CM

## 2016-07-13 DIAGNOSIS — R531 Weakness: Secondary | ICD-10-CM | POA: Diagnosis not present

## 2016-07-13 DIAGNOSIS — R938 Abnormal findings on diagnostic imaging of other specified body structures: Secondary | ICD-10-CM

## 2016-07-13 DIAGNOSIS — R0981 Nasal congestion: Secondary | ICD-10-CM | POA: Diagnosis not present

## 2016-07-13 DIAGNOSIS — Z79899 Other long term (current) drug therapy: Secondary | ICD-10-CM | POA: Diagnosis not present

## 2016-07-13 DIAGNOSIS — Z170 Estrogen receptor positive status [ER+]: Secondary | ICD-10-CM

## 2016-07-13 DIAGNOSIS — Z7689 Persons encountering health services in other specified circumstances: Secondary | ICD-10-CM | POA: Diagnosis not present

## 2016-07-13 LAB — COMPREHENSIVE METABOLIC PANEL
ALK PHOS: 114 U/L (ref 38–126)
ALT: 24 U/L (ref 14–54)
ANION GAP: 4 — AB (ref 5–15)
AST: 24 U/L (ref 15–41)
Albumin: 3.9 g/dL (ref 3.5–5.0)
BUN: 12 mg/dL (ref 6–20)
CALCIUM: 8.8 mg/dL — AB (ref 8.9–10.3)
CO2: 26 mmol/L (ref 22–32)
CREATININE: 0.68 mg/dL (ref 0.44–1.00)
Chloride: 108 mmol/L (ref 101–111)
GFR calc non Af Amer: >60 mL/min (ref >60)
Glucose, Bld: 106 mg/dL — ABNORMAL HIGH (ref 65–99)
Potassium: 3.8 mmol/L (ref 3.5–5.1)
SODIUM: 138 mmol/L (ref 135–145)
TOTAL PROTEIN: 6.9 g/dL (ref 6.5–8.1)
Total Bilirubin: 0.5 mg/dL (ref 0.3–1.2)

## 2016-07-13 LAB — CBC WITH DIFFERENTIAL/PLATELET
Basophils Absolute: 0.1 10*3/uL (ref 0–0.1)
Basophils Relative: 1 %
Eosinophils Absolute: 0 10*3/uL (ref 0–0.7)
Eosinophils Relative: 0 %
HEMATOCRIT: 32.6 % — AB (ref 35.0–47.0)
HEMOGLOBIN: 11.3 g/dL — AB (ref 12.0–16.0)
LYMPHS ABS: 1.3 10*3/uL (ref 1.0–3.6)
LYMPHS PCT: 14 %
MCH: 30.2 pg (ref 26.0–34.0)
MCHC: 34.7 g/dL (ref 32.0–36.0)
MCV: 86.9 fL (ref 80.0–100.0)
MONOS PCT: 9 %
Monocytes Absolute: 0.8 10*3/uL (ref 0.2–0.9)
NEUTROS ABS: 6.6 10*3/uL — AB (ref 1.4–6.5)
NEUTROS PCT: 76 %
Platelets: 273 10*3/uL (ref 150–440)
RBC: 3.75 MIL/uL — AB (ref 3.80–5.20)
RDW: 12.6 % (ref 11.5–14.5)
WBC: 8.7 10*3/uL (ref 3.6–11.0)

## 2016-07-13 MED ORDER — ONDANSETRON HCL 8 MG PO TABS: 8.0000 mg | ORAL_TABLET | Freq: Two times a day (BID) | ORAL | 1 refills | Status: DC | PRN

## 2016-07-13 MED ORDER — SODIUM CHLORIDE 0.9 % IV SOLN
Freq: Once | INTRAVENOUS | Status: AC
  Administered 2016-07-13: 11:00:00 via INTRAVENOUS
  Filled 2016-07-13: qty 5

## 2016-07-13 MED ORDER — DOXORUBICIN HCL CHEMO IV INJECTION 2 MG/ML
60.0000 mg/m2 | Freq: Once | INTRAVENOUS | Status: AC
  Administered 2016-07-13: 112 mg via INTRAVENOUS
  Filled 2016-07-13: qty 56

## 2016-07-13 MED ORDER — HEPARIN SOD (PORK) LOCK FLUSH 100 UNIT/ML IV SOLN
500.0000 [IU] | Freq: Once | INTRAVENOUS | Status: AC | PRN
  Administered 2016-07-13: 500 [IU]
  Filled 2016-07-13: qty 5

## 2016-07-13 MED ORDER — SODIUM CHLORIDE 0.9 % IV SOLN
Freq: Once | INTRAVENOUS | Status: AC
  Administered 2016-07-13: 11:00:00 via INTRAVENOUS
  Filled 2016-07-13: qty 1000

## 2016-07-13 MED ORDER — PALONOSETRON HCL INJECTION 0.25 MG/5ML
0.2500 mg | Freq: Once | INTRAVENOUS | Status: AC
  Administered 2016-07-13: 0.25 mg via INTRAVENOUS
  Filled 2016-07-13: qty 5

## 2016-07-13 MED ORDER — SODIUM CHLORIDE 0.9 % IV SOLN
600.0000 mg/m2 | Freq: Once | INTRAVENOUS | Status: AC
  Administered 2016-07-13: 1120 mg via INTRAVENOUS
  Filled 2016-07-13: qty 50

## 2016-07-13 MED ORDER — PEGFILGRASTIM 6 MG/0.6ML ~~LOC~~ PSKT
6.0000 mg | PREFILLED_SYRINGE | Freq: Once | SUBCUTANEOUS | Status: AC
  Administered 2016-07-13: 6 mg via SUBCUTANEOUS
  Filled 2016-07-13: qty 0.6

## 2016-07-13 MED ORDER — SODIUM CHLORIDE 0.9% FLUSH
10.0000 mL | INTRAVENOUS | Status: DC | PRN
  Administered 2016-07-13: 10 mL
  Filled 2016-07-13: qty 10

## 2016-07-13 NOTE — Progress Notes (Signed)
  Oncology Nurse Navigator Documentation  Navigator Location: [facility] (07/13/16 1500)   )Navigator Encounter Type: Treatment (07/13/16 1500)      Met patient and her friend today during her chemotherapy treatment.  Offered support.  No needs at this time.                                              Time Spent with Patient: 15 (07/13/16 1500)

## 2016-07-26 NOTE — Progress Notes (Signed)
[facility]  Telephone:(336) [phone] Fax:(336) [phone]  ID: FIZZA SCALES OB: [birth year]  MR#: [id number]  [id number]#:[id number]  [name], MD as PCP - General (Family Medicine) Clent Jacks, RN as Registered Nurse  CHIEF COMPLAINT: Clinical stage IIa ER/PR positive, HER-2 negative invasive carcinoma of the upper outer quadrant of the right breast.  INTERVAL HISTORY: Patient returns to clinic today for further evaluation and consideration of cycle 1 of 12 of weekly Taxol. She recently completed 4 cycles of dose dense Adriamycin and Cytoxan. She continues to be highly anxious and reports occasional insomnia, but otherwise feels well. She has no neurologic complaints. She denies any recent fevers or illnesses. She has a good appetite and denies weight loss.  She has no chest pain or shortness of breath. She reports occasional nausea, controlled with zofran, and denies any vomiting or diarrhea. She has no urinary complaints. Patient offers no further specific complaints today.   REVIEW OF SYSTEMS:   Review of Systems  Constitutional: Positive for malaise/fatigue. Negative for fever and weight loss.  HENT: Positive for congestion. Negative for sinus pain.   Respiratory: Negative.  Negative for cough and shortness of breath.   Cardiovascular: Negative for chest pain and leg swelling.  Gastrointestinal: Positive for constipation and nausea. Negative for abdominal pain, diarrhea and vomiting.  Genitourinary: Negative.  Negative for frequency and urgency.  Musculoskeletal: Negative.   Neurological: Negative for dizziness, tingling, sensory change, weakness and headaches.  Psychiatric/Behavioral: Negative for depression. The patient is nervous/anxious. The patient does not have insomnia.     As per HPI. Otherwise, a complete review of systems is negative.  PAST MEDICAL HISTORY: Past Medical History:  Diagnosis Date  . Anxiety   . Cancer ([place])  04/24/2016   right breast  . HSV infection    HISTORY OF HSV  . PONV (postoperative nausea and vomiting)    nausea no vomiting  . Post-menopausal 10/20/2005  . Postmenopause bleeding   . Thickened endometrium 05/30/2016    PAST SURGICAL HISTORY: Past Surgical History:  Procedure Laterality Date  . ANAL FISTULECTOMY    . ENDOMETRIAL BIOPSY    . KNEE SURGERY     snow skating  . PORTACATH PLACEMENT N/A 05/23/2016   Procedure: INSERTION PORT-A-CATH;  Surgeon: Leonie Green, MD;  Location: [facility] ORS;  Service: General;  Laterality: N/A;    FAMILY HISTORY: Family History  Problem Relation Age of Onset  . Cancer Mother     BREAST AND BONES  . Breast cancer Mother 67  . Emphysema Father   . Cancer Father   . Cancer Maternal Aunt     ADVANCED DIRECTIVES (Y/N):  N  HEALTH MAINTENANCE: Social History  Substance Use Topics  . Smoking status: Never Smoker  . Smokeless tobacco: Never Used  . Alcohol use 3.6 - 4.8 oz/week    3 - 4 Glasses of wine, 3 - 4 Cans of beer per week     Colonoscopy:  PAP:  Bone density:  Lipid panel:  Allergies  Allergen Reactions  . Penicillins Anaphylaxis    unknown    Current Outpatient Prescriptions  Medication Sig Dispense Refill  . ALPRAZolam (XANAX) 0.25 MG tablet Take 1 tablet (0.25 mg total) by mouth at bedtime. (Patient taking differently: Take 0.25 mg by mouth at bedtime as needed. ) 30 tablet 0  . citalopram (CELEXA) 20 MG tablet Take 20 mg by mouth daily.     Marland Kitchen docusate sodium (COLACE) 100  MG capsule Take 200 mg by mouth daily.    Marland Kitchen lidocaine-prilocaine (EMLA) cream Apply to affected area once 30 g 3  . Multiple Vitamins-Minerals (MULTIVITAMIN ADULTS 50+) TABS Take 1 tablet by mouth daily.    . ondansetron (ZOFRAN) 8 MG tablet Take 1 tablet (8 mg total) by mouth 2 (two) times daily as needed. Start on the third day after chemotherapy. 30 tablet 1  . polycarbophil (FIBERCON) 625 MG tablet Take 625 mg by mouth 2 (two) times daily.     . prochlorperazine (COMPAZINE) 10 MG tablet Take 1 tablet (10 mg total) by mouth every 6 (six) hours as needed (Nausea or vomiting). 30 tablet 1  . zolpidem (AMBIEN) 10 MG tablet Take 1 tablet (10 mg total) by mouth at bedtime as needed for sleep. 30 tablet 1   No current facility-administered medications for this visit.     OBJECTIVE: Vitals:   07/27/16 1007  BP: 126/85  Pulse: 82  Resp: 18  Temp: (!) 96.9 F (36.1 C)     Body mass index is 25.06 kg/m.    ECOG FS:0 - Asymptomatic  General: Well-developed, well-nourished, no acute distress. Eyes: Pink conjunctiva, anicteric sclera. Breasts: Patient requested exam be deferred today. Lungs: Clear to auscultation bilaterally. Heart: Regular rate and rhythm. No rubs, murmurs, or gallops. Abdomen: Soft, nontender, nondistended. No organomegaly noted, normoactive bowel sounds. Musculoskeletal: No edema, cyanosis, or clubbing. Neuro: Alert, answering all questions appropriately. Cranial nerves grossly intact. Skin: No rashes or petechiae noted. Psych: Normal affect.   LAB RESULTS:  Lab Results  Component Value Date   NA 138 07/27/2016   K 3.8 07/27/2016   CL 106 07/27/2016   CO2 24 07/27/2016   GLUCOSE 110 (H) 07/27/2016   BUN 14 07/27/2016   CREATININE 0.69 07/27/2016   CALCIUM 8.9 07/27/2016   PROT 7.0 07/27/2016   ALBUMIN 4.2 07/27/2016   AST 24 07/27/2016   ALT 17 07/27/2016   ALKPHOS 115 07/27/2016   BILITOT 0.4 07/27/2016   GFRNONAA >60 07/27/2016   GFRAA >60 07/27/2016    Lab Results  Component Value Date   WBC 8.2 07/27/2016   NEUTROABS 6.1 07/27/2016   HGB 10.7 (L) 07/27/2016   HCT 30.6 (L) 07/27/2016   MCV 88.5 07/27/2016   PLT 295 07/27/2016     STUDIES: No results found.  ASSESSMENT: Clinical stage IIa ER/PR positive, HER-2 negative invasive carcinoma of the upper outer quadrant of the right breast.  PLAN:    1. Clinical stage IIa ER/PR positive, HER-2 negative invasive carcinoma of the  upper outer quadrant of the right breast: CT scan results reviewed independently with no obvious metastatic disease. Patient has elected to proceed with neoadjuvant chemotherapy using Adriamycin, Cytoxan, and Taxol. Pretreatment MUGA scan revealed an EF of 74%. Patient is now completed 4 cycles of dose dense Adriamycin and Cytoxan and will proceed with cycle 1 of 12 of weekly Taxol. Patient will require surgery and radiation after the completion of her chemotherapy. She will also benefit from an aromatase inhibitor for 5 years at the completion of all treatments.  Return to clinic in 1 week for consideration of cycle 2. 2. Endometrial thickening: Patient was seen by gynecology oncology. Biopsy confirms no evidence of malignancy.  3. Constipation: Continue OTC treatments as needed. 4. Anemia: Mild, monitor.   Patient expressed understanding and was in agreement with this plan. She also understands that She can call clinic at any time with any questions, concerns, or complaints.  Cancer Staging Primary cancer of upper outer quadrant of right female breast [facility]) Staging form: Breast, AJCC 7th Edition - Clinical stage from 04/26/2016: Stage IIA (T1c, N1, M0) - Signed by Lloyd Huger, MD on 05/25/2016    Lloyd Huger, MD 07/30/16 11:19 AM

## 2016-07-27 ENCOUNTER — Inpatient Hospital Stay: Payer: 59

## 2016-07-27 ENCOUNTER — Inpatient Hospital Stay: Payer: 59 | Attending: Oncology | Admitting: Oncology

## 2016-07-27 ENCOUNTER — Encounter: Payer: Self-pay | Admitting: *Deleted

## 2016-07-27 VITALS — BP 118/82 | HR 72 | Resp 18

## 2016-07-27 VITALS — BP 126/85 | HR 82 | Temp 96.9°F | Resp 18 | Wt 164.8 lb

## 2016-07-27 DIAGNOSIS — F419 Anxiety disorder, unspecified: Secondary | ICD-10-CM | POA: Insufficient documentation

## 2016-07-27 DIAGNOSIS — C50911 Malignant neoplasm of unspecified site of right female breast: Secondary | ICD-10-CM | POA: Insufficient documentation

## 2016-07-27 DIAGNOSIS — K5900 Constipation, unspecified: Secondary | ICD-10-CM | POA: Diagnosis not present

## 2016-07-27 DIAGNOSIS — D649 Anemia, unspecified: Secondary | ICD-10-CM | POA: Diagnosis not present

## 2016-07-27 DIAGNOSIS — R110 Nausea: Secondary | ICD-10-CM | POA: Diagnosis not present

## 2016-07-27 DIAGNOSIS — Z79899 Other long term (current) drug therapy: Secondary | ICD-10-CM

## 2016-07-27 DIAGNOSIS — Z170 Estrogen receptor positive status [ER+]: Secondary | ICD-10-CM

## 2016-07-27 DIAGNOSIS — R5383 Other fatigue: Secondary | ICD-10-CM | POA: Insufficient documentation

## 2016-07-27 DIAGNOSIS — Z800 Family history of malignant neoplasm of digestive organs: Secondary | ICD-10-CM | POA: Diagnosis not present

## 2016-07-27 DIAGNOSIS — G4700 Insomnia, unspecified: Secondary | ICD-10-CM | POA: Insufficient documentation

## 2016-07-27 DIAGNOSIS — C50411 Malignant neoplasm of upper-outer quadrant of right female breast: Secondary | ICD-10-CM

## 2016-07-27 DIAGNOSIS — Z803 Family history of malignant neoplasm of breast: Secondary | ICD-10-CM | POA: Insufficient documentation

## 2016-07-27 DIAGNOSIS — R531 Weakness: Secondary | ICD-10-CM | POA: Insufficient documentation

## 2016-07-27 DIAGNOSIS — Z5111 Encounter for antineoplastic chemotherapy: Secondary | ICD-10-CM | POA: Insufficient documentation

## 2016-07-27 DIAGNOSIS — R938 Abnormal findings on diagnostic imaging of other specified body structures: Secondary | ICD-10-CM | POA: Diagnosis not present

## 2016-07-27 LAB — CBC WITH DIFFERENTIAL/PLATELET
BASOS PCT: 1 %
Basophils Absolute: 0.1 10*3/uL (ref 0–0.1)
EOS ABS: 0 10*3/uL (ref 0–0.7)
Eosinophils Relative: 0 %
HCT: 30.6 % — ABNORMAL LOW (ref 35.0–47.0)
Hemoglobin: 10.7 g/dL — ABNORMAL LOW (ref 12.0–16.0)
Lymphocytes Relative: 14 %
Lymphs Abs: 1.1 10*3/uL (ref 1.0–3.6)
MCH: 30.9 pg (ref 26.0–34.0)
MCHC: 35 g/dL (ref 32.0–36.0)
MCV: 88.5 fL (ref 80.0–100.0)
MONO ABS: 1 10*3/uL — AB (ref 0.2–0.9)
MONOS PCT: 12 %
NEUTROS PCT: 73 %
Neutro Abs: 6.1 10*3/uL (ref 1.4–6.5)
Platelets: 295 10*3/uL (ref 150–440)
RBC: 3.46 MIL/uL — ABNORMAL LOW (ref 3.80–5.20)
RDW: 14.8 % — AB (ref 11.5–14.5)
WBC: 8.2 10*3/uL (ref 3.6–11.0)

## 2016-07-27 LAB — COMPREHENSIVE METABOLIC PANEL
ALT: 17 U/L (ref 14–54)
ANION GAP: 8 (ref 5–15)
AST: 24 U/L (ref 15–41)
Albumin: 4.2 g/dL (ref 3.5–5.0)
Alkaline Phosphatase: 115 U/L (ref 38–126)
BUN: 14 mg/dL (ref 6–20)
CHLORIDE: 106 mmol/L (ref 101–111)
CO2: 24 mmol/L (ref 22–32)
Calcium: 8.9 mg/dL (ref 8.9–10.3)
Creatinine, Ser: 0.69 mg/dL (ref 0.44–1.00)
Glucose, Bld: 110 mg/dL — ABNORMAL HIGH (ref 65–99)
POTASSIUM: 3.8 mmol/L (ref 3.5–5.1)
Sodium: 138 mmol/L (ref 135–145)
Total Bilirubin: 0.4 mg/dL (ref 0.3–1.2)
Total Protein: 7 g/dL (ref 6.5–8.1)

## 2016-07-27 MED ORDER — SODIUM CHLORIDE 0.9% FLUSH
10.0000 mL | INTRAVENOUS | Status: DC | PRN
  Administered 2016-07-27: 10 mL via INTRAVENOUS
  Filled 2016-07-27: qty 10

## 2016-07-27 MED ORDER — FAMOTIDINE IN NACL 20-0.9 MG/50ML-% IV SOLN
20.0000 mg | Freq: Once | INTRAVENOUS | Status: AC
Start: 2016-07-27 — End: 2016-07-27
  Administered 2016-07-27: 20 mg via INTRAVENOUS
  Filled 2016-07-27: qty 50

## 2016-07-27 MED ORDER — SODIUM CHLORIDE 0.9 % IV SOLN
Freq: Once | INTRAVENOUS | Status: AC
  Administered 2016-07-27: 11:00:00 via INTRAVENOUS
  Filled 2016-07-27: qty 1000

## 2016-07-27 MED ORDER — ZOLPIDEM TARTRATE 10 MG PO TABS: 10.0000 mg | ORAL_TABLET | Freq: Every evening | ORAL | 1 refills | Status: DC | PRN

## 2016-07-27 MED ORDER — DEXAMETHASONE SODIUM PHOSPHATE 10 MG/ML IJ SOLN
10.0000 mg | Freq: Once | INTRAMUSCULAR | Status: AC
  Administered 2016-07-27: 10 mg via INTRAVENOUS
  Filled 2016-07-27: qty 1

## 2016-07-27 MED ORDER — SODIUM CHLORIDE 0.9 % IV SOLN
80.0000 mg/m2 | Freq: Once | INTRAVENOUS | Status: AC
  Administered 2016-07-27: 150 mg via INTRAVENOUS
  Filled 2016-07-27: qty 25

## 2016-07-27 MED ORDER — SODIUM CHLORIDE 0.9 % IV SOLN: 10.0000 mg | Freq: Once | INTRAVENOUS | Status: DC

## 2016-07-27 MED ORDER — HEPARIN SOD (PORK) LOCK FLUSH 100 UNIT/ML IV SOLN
500.0000 [IU] | Freq: Once | INTRAVENOUS | Status: AC
  Administered 2016-07-27: 500 [IU] via INTRAVENOUS
  Filled 2016-07-27: qty 5

## 2016-07-27 MED ORDER — DIPHENHYDRAMINE HCL 50 MG/ML IJ SOLN
25.0000 mg | Freq: Once | INTRAMUSCULAR | Status: AC
  Administered 2016-07-27: 25 mg via INTRAVENOUS
  Filled 2016-07-27: qty 1

## 2016-07-27 NOTE — Progress Notes (Signed)
  Oncology Nurse Navigator Documentation  Navigator Location: [facility] (07/27/16 1500)   )Navigator Encounter Type: Treatment (07/27/16 1500)      Met patient and her sister prior to her chemotherapy treatment today.  Wants info on how to take care of her wig.  She is going to see one of the volunteers in the gift shop today.  Also recommended her hairdresser or [employer] through [employer].  No other needs at this time.                                              Time Spent with Patient: 15 (07/27/16 1500)

## 2016-07-27 NOTE — Progress Notes (Signed)
Offers no complaints. States is feeling well. 

## 2016-08-02 NOTE — Progress Notes (Signed)
[place]  Telephone:(336) [phone] Fax:(336) [phone]  ID: Kimberly Clarke OB: Feb 03, 1956  MR#: [id number]  RDE#:[id number]  [name], MD as PCP - General (Family Medicine) Clent Jacks, RN as Registered Nurse  CHIEF COMPLAINT: Clinical stage IIa ER/PR positive, HER-2 negative invasive carcinoma of the upper outer quadrant of the right breast.  INTERVAL HISTORY: Patient returns to clinic today for further evaluation and consideration of cycle 2 of 12 of weekly Taxol. She recently completed 4 cycles of dose dense Adriamycin and Cytoxan. She tolerated her first infusion well without significant side effects. She continues to be highly anxious and reports occasional insomnia, but otherwise feels well. She has no neurologic complaints. She denies any recent fevers or illnesses. She has a good appetite and denies weight loss. She has no chest pain or shortness of breath. She denies any nausea, vomiting, constipation, or diarrhea. She has no urinary complaints. Patient offers no further specific complaints today.   REVIEW OF SYSTEMS:   Review of Systems  Constitutional: Negative for fever, malaise/fatigue and weight loss.  HENT: Negative for congestion and sinus pain.   Respiratory: Negative.  Negative for cough and shortness of breath.   Cardiovascular: Negative for chest pain and leg swelling.  Gastrointestinal: Positive for constipation. Negative for abdominal pain, diarrhea, nausea and vomiting.  Genitourinary: Negative.  Negative for frequency and urgency.  Musculoskeletal: Negative.   Neurological: Negative for dizziness, tingling, sensory change, weakness and headaches.  Psychiatric/Behavioral: Negative for depression. The patient is nervous/anxious. The patient does not have insomnia.     As per HPI. Otherwise, a complete review of systems is negative.  PAST MEDICAL HISTORY: Past Medical History:  Diagnosis Date  . Anxiety   .  Cancer ([place]) 04/24/2016   right breast  . HSV infection    HISTORY OF HSV  . PONV (postoperative nausea and vomiting)    nausea no vomiting  . Post-menopausal 10/20/2005  . Postmenopause bleeding   . Thickened endometrium 05/30/2016    PAST SURGICAL HISTORY: Past Surgical History:  Procedure Laterality Date  . ANAL FISTULECTOMY    . ENDOMETRIAL BIOPSY    . KNEE SURGERY     snow skating  . PORTACATH PLACEMENT N/A 05/23/2016   Procedure: INSERTION PORT-A-CATH;  Surgeon: Leonie Green, MD;  Location: [facility] ORS;  Service: General;  Laterality: N/A;    FAMILY HISTORY: Family History  Problem Relation Age of Onset  . Cancer Mother     BREAST AND BONES  . Breast cancer Mother 59  . Emphysema Father   . Cancer Father   . Cancer Maternal Aunt     ADVANCED DIRECTIVES (Y/N):  N  HEALTH MAINTENANCE: Social History  Substance Use Topics  . Smoking status: Never Smoker  . Smokeless tobacco: Never Used  . Alcohol use 3.6 - 4.8 oz/week    3 - 4 Glasses of wine, 3 - 4 Cans of beer per week     Colonoscopy:  PAP:  Bone density:  Lipid panel:  Allergies  Allergen Reactions  . Penicillins Anaphylaxis    unknown    Current Outpatient Prescriptions  Medication Sig Dispense Refill  . ALPRAZolam (XANAX) 0.25 MG tablet Take 1 tablet (0.25 mg total) by mouth at bedtime. (Patient taking differently: Take 0.25 mg by mouth at bedtime as needed. ) 30 tablet 0  . citalopram (CELEXA) 20 MG tablet Take 20 mg by mouth daily.     Marland Kitchen docusate sodium (COLACE) 100  MG capsule Take 200 mg by mouth daily.    Marland Kitchen lidocaine-prilocaine (EMLA) cream Apply to affected area once 30 g 3  . Multiple Vitamins-Minerals (MULTIVITAMIN ADULTS 50+) TABS Take 1 tablet by mouth daily.    . ondansetron (ZOFRAN) 8 MG tablet Take 1 tablet (8 mg total) by mouth 2 (two) times daily as needed. Start on the third day after chemotherapy. 30 tablet 1  . polycarbophil (FIBERCON) 625 MG tablet Take 625 mg by mouth 2 (two)  times daily.    . prochlorperazine (COMPAZINE) 10 MG tablet Take 1 tablet (10 mg total) by mouth every 6 (six) hours as needed (Nausea or vomiting). 30 tablet 1  . zolpidem (AMBIEN) 10 MG tablet Take 1 tablet (10 mg total) by mouth at bedtime as needed for sleep. 30 tablet 1   No current facility-administered medications for this visit.     OBJECTIVE: Vitals:   08/03/16 1104  BP: 131/86  Pulse: 91  Resp: 18  Temp: 98.7 F (37.1 C)     Body mass index is 25.44 kg/m.    ECOG FS:0 - Asymptomatic  General: Well-developed, well-nourished, no acute distress. Eyes: Pink conjunctiva, anicteric sclera. Breasts: Patient requested exam be deferred today. Lungs: Clear to auscultation bilaterally. Heart: Regular rate and rhythm. No rubs, murmurs, or gallops. Abdomen: Soft, nontender, nondistended. No organomegaly noted, normoactive bowel sounds. Musculoskeletal: No edema, cyanosis, or clubbing. Neuro: Alert, answering all questions appropriately. Cranial nerves grossly intact. Skin: No rashes or petechiae noted. Psych: Normal affect.   LAB RESULTS:  Lab Results  Component Value Date   NA 135 08/03/2016   K 3.8 08/03/2016   CL 106 08/03/2016   CO2 24 08/03/2016   GLUCOSE 99 08/03/2016   BUN 14 08/03/2016   CREATININE 0.67 08/03/2016   CALCIUM 8.9 08/03/2016   PROT 6.5 08/03/2016   ALBUMIN 4.0 08/03/2016   AST 30 08/03/2016   ALT 26 08/03/2016   ALKPHOS 84 08/03/2016   BILITOT 0.3 08/03/2016   GFRNONAA >60 08/03/2016   GFRAA >60 08/03/2016    Lab Results  Component Value Date   WBC 5.5 08/03/2016   NEUTROABS 3.9 08/03/2016   HGB 10.0 (L) 08/03/2016   HCT 28.3 (L) 08/03/2016   MCV 89.5 08/03/2016   PLT 426 08/03/2016     STUDIES: No results found.  ASSESSMENT: Clinical stage IIa ER/PR positive, HER-2 negative invasive carcinoma of the upper outer quadrant of the right breast.  PLAN:    1. Clinical stage IIa ER/PR positive, HER-2 negative invasive carcinoma of the  upper outer quadrant of the right breast: CT scan results reviewed independently with no obvious metastatic disease. Patient has elected to proceed with neoadjuvant chemotherapy using Adriamycin, Cytoxan, and Taxol. Pretreatment MUGA scan revealed an EF of 74%. Patient is now completed 4 cycles of dose dense Adriamycin and Cytoxan and will proceed with cycle 2 of 12 of weekly Taxol. Patient will require surgery and radiation after the completion of her chemotherapy. She will also benefit from an aromatase inhibitor for 5 years at the completion of all treatments. Return to clinic in 1 week for Taxol only and then in 2 weeks for further evaluation and consideration of cycle 4.  2. Endometrial thickening: Patient was seen by gynecology oncology. Biopsy confirms no evidence of malignancy.  3. Constipation: Continue OTC treatments as needed. 4. Anemia: Mild, monitor.   Patient expressed understanding and was in agreement with this plan. She also understands that She can call clinic at any  time with any questions, concerns, or complaints.   Cancer Staging Primary cancer of upper outer quadrant of right female breast [facility]) Staging form: Breast, AJCC 7th Edition - Clinical stage from 04/26/2016: Stage IIA (T1c, N1, M0) - Signed by Lloyd Huger, MD on 05/25/2016    Lloyd Huger, MD 08/06/16 8:45 AM

## 2016-08-03 ENCOUNTER — Inpatient Hospital Stay: Payer: 59

## 2016-08-03 ENCOUNTER — Inpatient Hospital Stay (HOSPITAL_BASED_OUTPATIENT_CLINIC_OR_DEPARTMENT_OTHER): Payer: 59 | Admitting: Oncology

## 2016-08-03 VITALS — BP 131/86 | HR 91 | Temp 98.7°F | Resp 18 | Wt 167.3 lb

## 2016-08-03 DIAGNOSIS — R531 Weakness: Secondary | ICD-10-CM | POA: Diagnosis not present

## 2016-08-03 DIAGNOSIS — Z79899 Other long term (current) drug therapy: Secondary | ICD-10-CM | POA: Diagnosis not present

## 2016-08-03 DIAGNOSIS — F419 Anxiety disorder, unspecified: Secondary | ICD-10-CM

## 2016-08-03 DIAGNOSIS — Z800 Family history of malignant neoplasm of digestive organs: Secondary | ICD-10-CM

## 2016-08-03 DIAGNOSIS — Z170 Estrogen receptor positive status [ER+]: Secondary | ICD-10-CM

## 2016-08-03 DIAGNOSIS — K5900 Constipation, unspecified: Secondary | ICD-10-CM

## 2016-08-03 DIAGNOSIS — C50911 Malignant neoplasm of unspecified site of right female breast: Secondary | ICD-10-CM | POA: Diagnosis not present

## 2016-08-03 DIAGNOSIS — G4700 Insomnia, unspecified: Secondary | ICD-10-CM

## 2016-08-03 DIAGNOSIS — R938 Abnormal findings on diagnostic imaging of other specified body structures: Secondary | ICD-10-CM

## 2016-08-03 DIAGNOSIS — D649 Anemia, unspecified: Secondary | ICD-10-CM

## 2016-08-03 DIAGNOSIS — R110 Nausea: Secondary | ICD-10-CM

## 2016-08-03 DIAGNOSIS — R5383 Other fatigue: Secondary | ICD-10-CM

## 2016-08-03 DIAGNOSIS — C50411 Malignant neoplasm of upper-outer quadrant of right female breast: Secondary | ICD-10-CM

## 2016-08-03 DIAGNOSIS — Z803 Family history of malignant neoplasm of breast: Secondary | ICD-10-CM

## 2016-08-03 LAB — CBC WITH DIFFERENTIAL/PLATELET
BASOS ABS: 0.1 10*3/uL (ref 0–0.1)
BASOS PCT: 2 %
Eosinophils Absolute: 0 10*3/uL (ref 0–0.7)
Eosinophils Relative: 0 %
HEMATOCRIT: 28.3 % — AB (ref 35.0–47.0)
Hemoglobin: 10 g/dL — ABNORMAL LOW (ref 12.0–16.0)
Lymphocytes Relative: 16 %
Lymphs Abs: 0.9 10*3/uL — ABNORMAL LOW (ref 1.0–3.6)
MCH: 31.6 pg (ref 26.0–34.0)
MCHC: 35.3 g/dL (ref 32.0–36.0)
MCV: 89.5 fL (ref 80.0–100.0)
Monocytes Absolute: 0.6 10*3/uL (ref 0.2–0.9)
Monocytes Relative: 11 %
NEUTROS ABS: 3.9 10*3/uL (ref 1.4–6.5)
NEUTROS PCT: 71 %
Platelets: 426 10*3/uL (ref 150–440)
RBC: 3.16 MIL/uL — ABNORMAL LOW (ref 3.80–5.20)
RDW: 16 % — ABNORMAL HIGH (ref 11.5–14.5)
WBC: 5.5 10*3/uL (ref 3.6–11.0)

## 2016-08-03 LAB — COMPREHENSIVE METABOLIC PANEL
ALBUMIN: 4 g/dL (ref 3.5–5.0)
ALK PHOS: 84 U/L (ref 38–126)
ALT: 26 U/L (ref 14–54)
ANION GAP: 5 (ref 5–15)
AST: 30 U/L (ref 15–41)
BILIRUBIN TOTAL: 0.3 mg/dL (ref 0.3–1.2)
BUN: 14 mg/dL (ref 6–20)
CALCIUM: 8.9 mg/dL (ref 8.9–10.3)
CO2: 24 mmol/L (ref 22–32)
Chloride: 106 mmol/L (ref 101–111)
Creatinine, Ser: 0.67 mg/dL (ref 0.44–1.00)
GFR calc Af Amer: >60 mL/min (ref >60)
GLUCOSE: 99 mg/dL (ref 65–99)
Potassium: 3.8 mmol/L (ref 3.5–5.1)
Sodium: 135 mmol/L (ref 135–145)
TOTAL PROTEIN: 6.5 g/dL (ref 6.5–8.1)

## 2016-08-03 MED ORDER — DEXAMETHASONE SODIUM PHOSPHATE 10 MG/ML IJ SOLN
10.0000 mg | Freq: Once | INTRAMUSCULAR | Status: AC
  Administered 2016-08-03: 10 mg via INTRAVENOUS
  Filled 2016-08-03: qty 1

## 2016-08-03 MED ORDER — HEPARIN SOD (PORK) LOCK FLUSH 100 UNIT/ML IV SOLN
500.0000 [IU] | Freq: Once | INTRAVENOUS | Status: AC | PRN
  Administered 2016-08-03: 500 [IU]
  Filled 2016-08-03: qty 5

## 2016-08-03 MED ORDER — DIPHENHYDRAMINE HCL 50 MG/ML IJ SOLN
25.0000 mg | Freq: Once | INTRAMUSCULAR | Status: AC
  Administered 2016-08-03: 25 mg via INTRAVENOUS
  Filled 2016-08-03: qty 1

## 2016-08-03 MED ORDER — SODIUM CHLORIDE 0.9 % IV SOLN
80.0000 mg/m2 | Freq: Once | INTRAVENOUS | Status: AC
  Administered 2016-08-03: 150 mg via INTRAVENOUS
  Filled 2016-08-03: qty 25

## 2016-08-03 MED ORDER — FAMOTIDINE IN NACL 20-0.9 MG/50ML-% IV SOLN
20.0000 mg | Freq: Once | INTRAVENOUS | Status: AC
  Administered 2016-08-03: 20 mg via INTRAVENOUS
  Filled 2016-08-03: qty 50

## 2016-08-03 MED ORDER — SODIUM CHLORIDE 0.9 % IV SOLN: 10.0000 mg | Freq: Once | INTRAVENOUS | Status: DC

## 2016-08-03 MED ORDER — SODIUM CHLORIDE 0.9% FLUSH
10.0000 mL | INTRAVENOUS | Status: DC | PRN
  Administered 2016-08-03: 10 mL
  Filled 2016-08-03: qty 10

## 2016-08-03 MED ORDER — SODIUM CHLORIDE 0.9 % IV SOLN
Freq: Once | INTRAVENOUS | Status: AC
  Administered 2016-08-03: 11:00:00 via INTRAVENOUS
  Filled 2016-08-03: qty 1000

## 2016-08-03 NOTE — Progress Notes (Signed)
Offers no complaints. States is feeling well. Tolerated last treatment without difficulty.

## 2016-08-10 ENCOUNTER — Inpatient Hospital Stay: Payer: 59

## 2016-08-10 DIAGNOSIS — C50411 Malignant neoplasm of upper-outer quadrant of right female breast: Secondary | ICD-10-CM

## 2016-08-10 DIAGNOSIS — C50911 Malignant neoplasm of unspecified site of right female breast: Secondary | ICD-10-CM | POA: Diagnosis not present

## 2016-08-10 LAB — CBC WITH DIFFERENTIAL/PLATELET
Basophils Absolute: 0.1 10*3/uL (ref 0–0.1)
Basophils Relative: 2 %
EOS ABS: 0.1 10*3/uL (ref 0–0.7)
Eosinophils Relative: 2 %
HCT: 29.2 % — ABNORMAL LOW (ref 35.0–47.0)
HEMOGLOBIN: 10.2 g/dL — AB (ref 12.0–16.0)
LYMPHS ABS: 0.7 10*3/uL — AB (ref 1.0–3.6)
LYMPHS PCT: 18 %
MCH: 31.6 pg (ref 26.0–34.0)
MCHC: 35 g/dL (ref 32.0–36.0)
MCV: 90.3 fL (ref 80.0–100.0)
Monocytes Absolute: 0.4 10*3/uL (ref 0.2–0.9)
Monocytes Relative: 11 %
NEUTROS ABS: 2.8 10*3/uL (ref 1.4–6.5)
NEUTROS PCT: 67 %
Platelets: 358 10*3/uL (ref 150–440)
RBC: 3.23 MIL/uL — AB (ref 3.80–5.20)
RDW: 17 % — ABNORMAL HIGH (ref 11.5–14.5)
WBC: 4.1 10*3/uL (ref 3.6–11.0)

## 2016-08-10 LAB — COMPREHENSIVE METABOLIC PANEL
ALK PHOS: 78 U/L (ref 38–126)
ALT: 38 U/L (ref 14–54)
ANION GAP: 6 (ref 5–15)
AST: 31 U/L (ref 15–41)
Albumin: 4 g/dL (ref 3.5–5.0)
BUN: 14 mg/dL (ref 6–20)
CALCIUM: 9 mg/dL (ref 8.9–10.3)
CO2: 25 mmol/L (ref 22–32)
Chloride: 107 mmol/L (ref 101–111)
Creatinine, Ser: 0.65 mg/dL (ref 0.44–1.00)
GFR calc Af Amer: >60 mL/min (ref >60)
GFR calc non Af Amer: >60 mL/min (ref >60)
GLUCOSE: 96 mg/dL (ref 65–99)
Potassium: 4.1 mmol/L (ref 3.5–5.1)
SODIUM: 138 mmol/L (ref 135–145)
Total Bilirubin: 0.3 mg/dL (ref 0.3–1.2)
Total Protein: 6.9 g/dL (ref 6.5–8.1)

## 2016-08-10 MED ORDER — HEPARIN SOD (PORK) LOCK FLUSH 100 UNIT/ML IV SOLN
500.0000 [IU] | Freq: Once | INTRAVENOUS | Status: AC
  Administered 2016-08-10: 500 [IU] via INTRAVENOUS
  Filled 2016-08-10: qty 5

## 2016-08-10 MED ORDER — HEPARIN SOD (PORK) LOCK FLUSH 100 UNIT/ML IV SOLN: 500.0000 [IU] | Freq: Once | INTRAVENOUS | Status: AC | PRN

## 2016-08-10 MED ORDER — SODIUM CHLORIDE 0.9 % IV SOLN
80.0000 mg/m2 | Freq: Once | INTRAVENOUS | Status: AC
  Administered 2016-08-10: 150 mg via INTRAVENOUS
  Filled 2016-08-10: qty 25

## 2016-08-10 MED ORDER — DEXAMETHASONE SODIUM PHOSPHATE 10 MG/ML IJ SOLN
10.0000 mg | Freq: Once | INTRAMUSCULAR | Status: AC
  Administered 2016-08-10: 10 mg via INTRAVENOUS
  Filled 2016-08-10: qty 1

## 2016-08-10 MED ORDER — DIPHENHYDRAMINE HCL 50 MG/ML IJ SOLN
25.0000 mg | Freq: Once | INTRAMUSCULAR | Status: AC
  Administered 2016-08-10: 25 mg via INTRAVENOUS
  Filled 2016-08-10: qty 1

## 2016-08-10 MED ORDER — SODIUM CHLORIDE 0.9% FLUSH
10.0000 mL | INTRAVENOUS | Status: DC | PRN
  Administered 2016-08-10: 10 mL via INTRAVENOUS
  Filled 2016-08-10: qty 10

## 2016-08-10 MED ORDER — SODIUM CHLORIDE 0.9 % IV SOLN
Freq: Once | INTRAVENOUS | Status: AC
  Administered 2016-08-10: 11:00:00 via INTRAVENOUS
  Filled 2016-08-10: qty 1000

## 2016-08-10 MED ORDER — FAMOTIDINE IN NACL 20-0.9 MG/50ML-% IV SOLN
20.0000 mg | Freq: Once | INTRAVENOUS | Status: AC
  Administered 2016-08-10: 20 mg via INTRAVENOUS
  Filled 2016-08-10: qty 50

## 2016-08-15 NOTE — Progress Notes (Signed)
[place]  Telephone:(336) [phone] Fax:(336) [phone]  ID: Kimberly Clarke OB: August 25, 1955  MR#: [id number]  IRS#:[id number]  [name], MD as PCP - General (Family Medicine) Clent Jacks, RN as Registered Nurse  CHIEF COMPLAINT: Clinical stage IIa ER/PR positive, HER-2 negative invasive carcinoma of the upper outer quadrant of the right breast.  INTERVAL HISTORY: Patient returns to clinic today for further evaluation and consideration of cycle 4 of 12 of weekly Taxol. She recently completed 4 cycles of dose dense Adriamycin and Cytoxan. She is tolerating her treatments well without significant side effects. She continues to be highly anxious and reports occasional insomnia, but otherwise feels well. She has no neurologic complaints. She denies any recent fevers or illnesses. She has a good appetite and denies weight loss. She has no chest pain or shortness of breath. She denies any nausea, vomiting, constipation, or diarrhea. She has no urinary complaints. Patient offers no further specific complaints today.   REVIEW OF SYSTEMS:   Review of Systems  Constitutional: Negative for fever, malaise/fatigue and weight loss.  HENT: Negative for congestion and sinus pain.   Respiratory: Negative.  Negative for cough and shortness of breath.   Cardiovascular: Negative for chest pain and leg swelling.  Gastrointestinal: Negative for abdominal pain, constipation, diarrhea, nausea and vomiting.  Genitourinary: Negative.  Negative for frequency and urgency.  Musculoskeletal: Negative.   Neurological: Negative for dizziness, tingling, sensory change, weakness and headaches.  Psychiatric/Behavioral: Negative for depression. The patient is nervous/anxious. The patient does not have insomnia.     As per HPI. Otherwise, a complete review of systems is negative.  PAST MEDICAL HISTORY: Past Medical History:  Diagnosis Date  . Anxiety   . Cancer ([place])  04/24/2016   right breast  . HSV infection    HISTORY OF HSV  . PONV (postoperative nausea and vomiting)    nausea no vomiting  . Post-menopausal 10/20/2005  . Postmenopause bleeding   . Thickened endometrium 05/30/2016    PAST SURGICAL HISTORY: Past Surgical History:  Procedure Laterality Date  . ANAL FISTULECTOMY    . ENDOMETRIAL BIOPSY    . KNEE SURGERY     snow skating  . PORTACATH PLACEMENT N/A 05/23/2016   Procedure: INSERTION PORT-A-CATH;  Surgeon: Leonie Green, MD;  Location: [facility] ORS;  Service: General;  Laterality: N/A;    FAMILY HISTORY: Family History  Problem Relation Age of Onset  . Cancer Mother     BREAST AND BONES  . Breast cancer Mother 56  . Emphysema Father   . Cancer Father   . Cancer Maternal Aunt     ADVANCED DIRECTIVES (Y/N):  N  HEALTH MAINTENANCE: Social History  Substance Use Topics  . Smoking status: Never Smoker  . Smokeless tobacco: Never Used  . Alcohol use 3.6 - 4.8 oz/week    3 - 4 Glasses of wine, 3 - 4 Cans of beer per week     Colonoscopy:  PAP:  Bone density:  Lipid panel:  Allergies  Allergen Reactions  . Penicillins Anaphylaxis    unknown    Current Outpatient Prescriptions  Medication Sig Dispense Refill  . BIOTIN PO Take by mouth.    . citalopram (CELEXA) 20 MG tablet Take 20 mg by mouth daily.     Marland Kitchen lidocaine-prilocaine (EMLA) cream Apply to affected area once 30 g 3  . Multiple Vitamins-Minerals (MULTIVITAMIN ADULTS 50+) TABS Take 1 tablet by mouth daily.    . ondansetron (  ZOFRAN) 8 MG tablet Take 1 tablet (8 mg total) by mouth 2 (two) times daily as needed. Start on the third day after chemotherapy. 30 tablet 1  . polycarbophil (FIBERCON) 625 MG tablet Take 625 mg by mouth 2 (two) times daily.    . prochlorperazine (COMPAZINE) 10 MG tablet Take 1 tablet (10 mg total) by mouth every 6 (six) hours as needed (Nausea or vomiting). 30 tablet 1  . zolpidem (AMBIEN) 10 MG tablet Take 1 tablet (10 mg total) by  mouth at bedtime as needed for sleep. 30 tablet 1  . ALPRAZolam (XANAX) 0.25 MG tablet Take 1 tablet (0.25 mg total) by mouth at bedtime. (Patient not taking: Reported on 08/17/2016) 30 tablet 0   No current facility-administered medications for this visit.     OBJECTIVE: Vitals:   08/17/16 1043  BP: 124/82  Temp: 98 F (36.7 C)     Body mass index is 25.23 kg/m.    ECOG FS:0 - Asymptomatic  General: Well-developed, well-nourished, no acute distress. Eyes: Pink conjunctiva, anicteric sclera. Breasts: Patient requested exam be deferred today. Lungs: Clear to auscultation bilaterally. Heart: Regular rate and rhythm. No rubs, murmurs, or gallops. Abdomen: Soft, nontender, nondistended. No organomegaly noted, normoactive bowel sounds. Musculoskeletal: No edema, cyanosis, or clubbing. Neuro: Alert, answering all questions appropriately. Cranial nerves grossly intact. Skin: No rashes or petechiae noted. Psych: Normal affect.   LAB RESULTS:  Lab Results  Component Value Date   NA 137 08/17/2016   K 4.0 08/17/2016   CL 109 08/17/2016   CO2 24 08/17/2016   GLUCOSE 96 08/17/2016   BUN 15 08/17/2016   CREATININE 0.74 08/17/2016   CALCIUM 9.0 08/17/2016   PROT 6.8 08/17/2016   ALBUMIN 4.0 08/17/2016   AST 33 08/17/2016   ALT 40 08/17/2016   ALKPHOS 78 08/17/2016   BILITOT 0.5 08/17/2016   GFRNONAA >60 08/17/2016   GFRAA >60 08/17/2016    Lab Results  Component Value Date   WBC 4.3 08/17/2016   NEUTROABS 2.9 08/17/2016   HGB 10.4 (L) 08/17/2016   HCT 30.0 (L) 08/17/2016   MCV 91.7 08/17/2016   PLT 312 08/17/2016     STUDIES: No results found.  ASSESSMENT: Clinical stage IIa ER/PR positive, HER-2 negative invasive carcinoma of the upper outer quadrant of the right breast.  PLAN:    1. Clinical stage IIa ER/PR positive, HER-2 negative invasive carcinoma of the upper outer quadrant of the right breast: CT scan results reviewed independently with no obvious metastatic  disease. Patient has elected to proceed with neoadjuvant chemotherapy using Adriamycin, Cytoxan, and Taxol. Pretreatment MUGA scan revealed an EF of 74%. Patient is now completed 4 cycles of dose dense Adriamycin and Cytoxan and will proceed with cycle 4 of 12 of weekly Taxol. Patient will require surgery and radiation after the completion of her chemotherapy. She will also benefit from an aromatase inhibitor for 5 years at the completion of all treatments. Return to clinic in 1 and 2 weeks for Taxol only and then in 3 weeks for further evaluation and consideration of cycle 7.  2. Endometrial thickening: Patient was seen by gynecology oncology. Biopsy confirms no evidence of malignancy.  3. Constipation: Resolved. Continue OTC treatments as needed. 4. Anemia: Mild, monitor.   Patient expressed understanding and was in agreement with this plan. She also understands that She can call clinic at any time with any questions, concerns, or complaints.   Cancer Staging Primary cancer of upper outer quadrant of right  female breast [facility]) Staging form: Breast, AJCC 7th Edition - Clinical stage from 04/26/2016: Stage IIA (T1c, N1, M0) - Signed by Lloyd Huger, MD on 05/25/2016    Lloyd Huger, MD 08/18/16 1:43 PM

## 2016-08-17 ENCOUNTER — Inpatient Hospital Stay: Payer: 59

## 2016-08-17 ENCOUNTER — Inpatient Hospital Stay (HOSPITAL_BASED_OUTPATIENT_CLINIC_OR_DEPARTMENT_OTHER): Payer: 59 | Admitting: Oncology

## 2016-08-17 ENCOUNTER — Encounter: Payer: Self-pay | Admitting: *Deleted

## 2016-08-17 VITALS — BP 124/82 | Temp 98.0°F | Wt 165.9 lb

## 2016-08-17 DIAGNOSIS — G4700 Insomnia, unspecified: Secondary | ICD-10-CM

## 2016-08-17 DIAGNOSIS — C50911 Malignant neoplasm of unspecified site of right female breast: Secondary | ICD-10-CM | POA: Diagnosis not present

## 2016-08-17 DIAGNOSIS — Z170 Estrogen receptor positive status [ER+]: Secondary | ICD-10-CM | POA: Diagnosis not present

## 2016-08-17 DIAGNOSIS — R938 Abnormal findings on diagnostic imaging of other specified body structures: Secondary | ICD-10-CM

## 2016-08-17 DIAGNOSIS — R110 Nausea: Secondary | ICD-10-CM | POA: Diagnosis not present

## 2016-08-17 DIAGNOSIS — R531 Weakness: Secondary | ICD-10-CM

## 2016-08-17 DIAGNOSIS — R5383 Other fatigue: Secondary | ICD-10-CM | POA: Diagnosis not present

## 2016-08-17 DIAGNOSIS — F419 Anxiety disorder, unspecified: Secondary | ICD-10-CM

## 2016-08-17 DIAGNOSIS — Z800 Family history of malignant neoplasm of digestive organs: Secondary | ICD-10-CM | POA: Diagnosis not present

## 2016-08-17 DIAGNOSIS — Z803 Family history of malignant neoplasm of breast: Secondary | ICD-10-CM

## 2016-08-17 DIAGNOSIS — D649 Anemia, unspecified: Secondary | ICD-10-CM | POA: Diagnosis not present

## 2016-08-17 DIAGNOSIS — C50411 Malignant neoplasm of upper-outer quadrant of right female breast: Secondary | ICD-10-CM

## 2016-08-17 DIAGNOSIS — K5900 Constipation, unspecified: Secondary | ICD-10-CM | POA: Diagnosis not present

## 2016-08-17 DIAGNOSIS — Z79899 Other long term (current) drug therapy: Secondary | ICD-10-CM

## 2016-08-17 LAB — CBC WITH DIFFERENTIAL/PLATELET
BASOS PCT: 2 %
Basophils Absolute: 0.1 10*3/uL (ref 0–0.1)
EOS ABS: 0.2 10*3/uL (ref 0–0.7)
Eosinophils Relative: 4 %
HCT: 30 % — ABNORMAL LOW (ref 35.0–47.0)
HEMOGLOBIN: 10.4 g/dL — AB (ref 12.0–16.0)
Lymphocytes Relative: 16 %
Lymphs Abs: 0.7 10*3/uL — ABNORMAL LOW (ref 1.0–3.6)
MCH: 31.8 pg (ref 26.0–34.0)
MCHC: 34.7 g/dL (ref 32.0–36.0)
MCV: 91.7 fL (ref 80.0–100.0)
MONOS PCT: 9 %
Monocytes Absolute: 0.4 10*3/uL (ref 0.2–0.9)
NEUTROS ABS: 2.9 10*3/uL (ref 1.4–6.5)
NEUTROS PCT: 69 %
Platelets: 312 10*3/uL (ref 150–440)
RBC: 3.27 MIL/uL — AB (ref 3.80–5.20)
RDW: 17 % — ABNORMAL HIGH (ref 11.5–14.5)
WBC: 4.3 10*3/uL (ref 3.6–11.0)

## 2016-08-17 LAB — COMPREHENSIVE METABOLIC PANEL
ALK PHOS: 78 U/L (ref 38–126)
ALT: 40 U/L (ref 14–54)
AST: 33 U/L (ref 15–41)
Albumin: 4 g/dL (ref 3.5–5.0)
Anion gap: 4 — ABNORMAL LOW (ref 5–15)
BUN: 15 mg/dL (ref 6–20)
CHLORIDE: 109 mmol/L (ref 101–111)
CO2: 24 mmol/L (ref 22–32)
CREATININE: 0.74 mg/dL (ref 0.44–1.00)
Calcium: 9 mg/dL (ref 8.9–10.3)
GFR calc Af Amer: >60 mL/min (ref >60)
GLUCOSE: 96 mg/dL (ref 65–99)
Potassium: 4 mmol/L (ref 3.5–5.1)
Sodium: 137 mmol/L (ref 135–145)
Total Bilirubin: 0.5 mg/dL (ref 0.3–1.2)
Total Protein: 6.8 g/dL (ref 6.5–8.1)

## 2016-08-17 MED ORDER — SODIUM CHLORIDE 0.9 % IV SOLN
Freq: Once | INTRAVENOUS | Status: AC
  Administered 2016-08-17: 11:00:00 via INTRAVENOUS
  Filled 2016-08-17: qty 1000

## 2016-08-17 MED ORDER — SODIUM CHLORIDE 0.9 % IV SOLN: 10.0000 mg | Freq: Once | INTRAVENOUS | Status: DC

## 2016-08-17 MED ORDER — SODIUM CHLORIDE 0.9% FLUSH
10.0000 mL | INTRAVENOUS | Status: DC | PRN
  Administered 2016-08-17: 10 mL via INTRAVENOUS
  Filled 2016-08-17: qty 10

## 2016-08-17 MED ORDER — HEPARIN SOD (PORK) LOCK FLUSH 100 UNIT/ML IV SOLN
500.0000 [IU] | Freq: Once | INTRAVENOUS | Status: AC
  Administered 2016-08-17: 500 [IU] via INTRAVENOUS
  Filled 2016-08-17: qty 5

## 2016-08-17 MED ORDER — DIPHENHYDRAMINE HCL 50 MG/ML IJ SOLN
25.0000 mg | Freq: Once | INTRAMUSCULAR | Status: AC
  Administered 2016-08-17: 25 mg via INTRAVENOUS
  Filled 2016-08-17: qty 1

## 2016-08-17 MED ORDER — FAMOTIDINE IN NACL 20-0.9 MG/50ML-% IV SOLN
20.0000 mg | Freq: Once | INTRAVENOUS | Status: AC
Start: 2016-08-17 — End: 2016-08-17
  Administered 2016-08-17: 20 mg via INTRAVENOUS
  Filled 2016-08-17: qty 50

## 2016-08-17 MED ORDER — SODIUM CHLORIDE 0.9 % IV SOLN
80.0000 mg/m2 | Freq: Once | INTRAVENOUS | Status: AC
  Administered 2016-08-17: 150 mg via INTRAVENOUS
  Filled 2016-08-17: qty 25

## 2016-08-17 MED ORDER — DEXAMETHASONE SODIUM PHOSPHATE 10 MG/ML IJ SOLN
10.0000 mg | Freq: Once | INTRAMUSCULAR | Status: AC
Start: 2016-08-17 — End: 2016-08-17
  Administered 2016-08-17: 10 mg via INTRAVENOUS
  Filled 2016-08-17: qty 1

## 2016-08-17 NOTE — Progress Notes (Signed)
  Oncology Nurse Navigator Documentation  Navigator Location: [facility] (08/17/16 1100)   )Navigator Encounter Type: Treatment (08/17/16 1100)      Met patient during her chemotherapy treatment.  States she is doing great.  Offered support.  She is to call if she has any questions or needs.                                              Time Spent with Patient: 15 (08/17/16 1100)

## 2016-08-17 NOTE — Progress Notes (Signed)
Patient is here for follow up she is doing well. Mentions a little neuropathy. She mentions nose bleeds and she is taking Claritin and is asking if she can have flonase.

## 2016-08-24 ENCOUNTER — Inpatient Hospital Stay: Payer: Commercial Managed Care - HMO | Attending: Oncology

## 2016-08-24 ENCOUNTER — Other Ambulatory Visit: Payer: Commercial Managed Care - HMO

## 2016-08-24 ENCOUNTER — Ambulatory Visit: Payer: Commercial Managed Care - HMO | Admitting: Oncology

## 2016-08-24 ENCOUNTER — Inpatient Hospital Stay: Payer: Commercial Managed Care - HMO

## 2016-08-24 ENCOUNTER — Encounter: Payer: Self-pay | Admitting: *Deleted

## 2016-08-24 ENCOUNTER — Ambulatory Visit: Payer: Commercial Managed Care - HMO

## 2016-08-24 VITALS — BP 126/80 | HR 78 | Temp 97.1°F | Resp 18

## 2016-08-24 DIAGNOSIS — Z170 Estrogen receptor positive status [ER+]: Secondary | ICD-10-CM | POA: Diagnosis not present

## 2016-08-24 DIAGNOSIS — G4700 Insomnia, unspecified: Secondary | ICD-10-CM | POA: Insufficient documentation

## 2016-08-24 DIAGNOSIS — Z809 Family history of malignant neoplasm, unspecified: Secondary | ICD-10-CM | POA: Diagnosis not present

## 2016-08-24 DIAGNOSIS — R938 Abnormal findings on diagnostic imaging of other specified body structures: Secondary | ICD-10-CM | POA: Diagnosis not present

## 2016-08-24 DIAGNOSIS — Z5111 Encounter for antineoplastic chemotherapy: Secondary | ICD-10-CM | POA: Diagnosis not present

## 2016-08-24 DIAGNOSIS — D649 Anemia, unspecified: Secondary | ICD-10-CM | POA: Insufficient documentation

## 2016-08-24 DIAGNOSIS — C50411 Malignant neoplasm of upper-outer quadrant of right female breast: Secondary | ICD-10-CM

## 2016-08-24 DIAGNOSIS — F419 Anxiety disorder, unspecified: Secondary | ICD-10-CM | POA: Insufficient documentation

## 2016-08-24 DIAGNOSIS — Z79899 Other long term (current) drug therapy: Secondary | ICD-10-CM | POA: Diagnosis not present

## 2016-08-24 LAB — COMPREHENSIVE METABOLIC PANEL
ALT: 35 U/L (ref 14–54)
AST: 30 U/L (ref 15–41)
Albumin: 4 g/dL (ref 3.5–5.0)
Alkaline Phosphatase: 84 U/L (ref 38–126)
Anion gap: 4 — ABNORMAL LOW (ref 5–15)
BUN: 12 mg/dL (ref 6–20)
CHLORIDE: 108 mmol/L (ref 101–111)
CO2: 25 mmol/L (ref 22–32)
Calcium: 9.1 mg/dL (ref 8.9–10.3)
Creatinine, Ser: 0.75 mg/dL (ref 0.44–1.00)
Glucose, Bld: 115 mg/dL — ABNORMAL HIGH (ref 65–99)
POTASSIUM: 3.9 mmol/L (ref 3.5–5.1)
Sodium: 137 mmol/L (ref 135–145)
Total Bilirubin: 0.4 mg/dL (ref 0.3–1.2)
Total Protein: 6.8 g/dL (ref 6.5–8.1)

## 2016-08-24 LAB — CBC WITH DIFFERENTIAL/PLATELET
BASOS ABS: 0.1 10*3/uL (ref 0–0.1)
BASOS PCT: 2 %
EOS PCT: 4 %
Eosinophils Absolute: 0.2 10*3/uL (ref 0–0.7)
HCT: 31 % — ABNORMAL LOW (ref 35.0–47.0)
Hemoglobin: 10.8 g/dL — ABNORMAL LOW (ref 12.0–16.0)
LYMPHS PCT: 16 %
Lymphs Abs: 0.7 10*3/uL — ABNORMAL LOW (ref 1.0–3.6)
MCH: 32.1 pg (ref 26.0–34.0)
MCHC: 34.8 g/dL (ref 32.0–36.0)
MCV: 92.2 fL (ref 80.0–100.0)
MONO ABS: 0.3 10*3/uL (ref 0.2–0.9)
Monocytes Relative: 7 %
Neutro Abs: 3.2 10*3/uL (ref 1.4–6.5)
Neutrophils Relative %: 71 %
PLATELETS: 294 10*3/uL (ref 150–440)
RBC: 3.37 MIL/uL — ABNORMAL LOW (ref 3.80–5.20)
RDW: 16.4 % — AB (ref 11.5–14.5)
WBC: 4.5 10*3/uL (ref 3.6–11.0)

## 2016-08-24 MED ORDER — FAMOTIDINE IN NACL 20-0.9 MG/50ML-% IV SOLN
20.0000 mg | Freq: Once | INTRAVENOUS | Status: AC
  Administered 2016-08-24: 20 mg via INTRAVENOUS
  Filled 2016-08-24: qty 50

## 2016-08-24 MED ORDER — SODIUM CHLORIDE 0.9 % IV SOLN
Freq: Once | INTRAVENOUS | Status: AC
  Administered 2016-08-24: 10:00:00 via INTRAVENOUS
  Filled 2016-08-24: qty 1000

## 2016-08-24 MED ORDER — SODIUM CHLORIDE 0.9% FLUSH
10.0000 mL | INTRAVENOUS | Status: DC | PRN
  Administered 2016-08-24: 10 mL via INTRAVENOUS
  Filled 2016-08-24: qty 10

## 2016-08-24 MED ORDER — HEPARIN SOD (PORK) LOCK FLUSH 100 UNIT/ML IV SOLN
500.0000 [IU] | Freq: Once | INTRAVENOUS | Status: AC
  Administered 2016-08-24: 500 [IU] via INTRAVENOUS

## 2016-08-24 MED ORDER — SODIUM CHLORIDE 0.9 % IV SOLN
80.0000 mg/m2 | Freq: Once | INTRAVENOUS | Status: AC
  Administered 2016-08-24: 150 mg via INTRAVENOUS
  Filled 2016-08-24: qty 25

## 2016-08-24 MED ORDER — HEPARIN SOD (PORK) LOCK FLUSH 100 UNIT/ML IV SOLN
INTRAVENOUS | Status: AC
  Filled 2016-08-24: qty 5

## 2016-08-24 MED ORDER — DIPHENHYDRAMINE HCL 50 MG/ML IJ SOLN
25.0000 mg | Freq: Once | INTRAMUSCULAR | Status: AC
  Administered 2016-08-24: 25 mg via INTRAVENOUS
  Filled 2016-08-24: qty 1

## 2016-08-24 MED ORDER — DEXAMETHASONE SODIUM PHOSPHATE 10 MG/ML IJ SOLN
10.0000 mg | Freq: Once | INTRAMUSCULAR | Status: AC
  Administered 2016-08-24: 10 mg via INTRAVENOUS
  Filled 2016-08-24: qty 1

## 2016-08-24 MED ORDER — SODIUM CHLORIDE 0.9 % IV SOLN: 10.0000 mg | Freq: Once | INTRAVENOUS | Status: DC

## 2016-08-24 NOTE — Progress Notes (Signed)
  Oncology Nurse Navigator Documentation  Navigator Location: [facility] (08/24/16 1100)   )Navigator Encounter Type: Treatment (08/24/16 1100)                  Patient has been emailing me with financial concerns regarding her upcoming surgery.  She wanted me to schedule her an appointment with a Development worker, community.  Discussed with Elliot Gault today,who to call for her, and she agreed to talk with her.  Introduced Brandi to the patient today during her chemo treatment.  They will arrange a meeting to discuss her concerns.                                  Time Spent with Patient: 45 (08/24/16 1100)

## 2016-08-31 ENCOUNTER — Inpatient Hospital Stay: Payer: Commercial Managed Care - HMO

## 2016-08-31 VITALS — BP 121/58 | HR 81 | Temp 98.9°F | Resp 18

## 2016-08-31 DIAGNOSIS — C50411 Malignant neoplasm of upper-outer quadrant of right female breast: Secondary | ICD-10-CM | POA: Diagnosis not present

## 2016-08-31 LAB — COMPREHENSIVE METABOLIC PANEL
ALK PHOS: 80 U/L (ref 38–126)
ALT: 35 U/L (ref 14–54)
AST: 33 U/L (ref 15–41)
Albumin: 4 g/dL (ref 3.5–5.0)
Anion gap: 5 (ref 5–15)
BILIRUBIN TOTAL: 0.5 mg/dL (ref 0.3–1.2)
BUN: 12 mg/dL (ref 6–20)
CALCIUM: 9 mg/dL (ref 8.9–10.3)
CO2: 23 mmol/L (ref 22–32)
CREATININE: 0.62 mg/dL (ref 0.44–1.00)
Chloride: 109 mmol/L (ref 101–111)
GFR calc Af Amer: >60 mL/min (ref >60)
GFR calc non Af Amer: >60 mL/min (ref >60)
GLUCOSE: 98 mg/dL (ref 65–99)
Potassium: 4 mmol/L (ref 3.5–5.1)
SODIUM: 137 mmol/L (ref 135–145)
Total Protein: 6.7 g/dL (ref 6.5–8.1)

## 2016-08-31 LAB — CBC WITH DIFFERENTIAL/PLATELET
Basophils Absolute: 0 10*3/uL (ref 0–0.1)
Basophils Relative: 1 %
EOS PCT: 4 %
Eosinophils Absolute: 0.1 10*3/uL (ref 0–0.7)
HEMATOCRIT: 30.7 % — AB (ref 35.0–47.0)
HEMOGLOBIN: 10.7 g/dL — AB (ref 12.0–16.0)
LYMPHS ABS: 0.6 10*3/uL — AB (ref 1.0–3.6)
LYMPHS PCT: 15 %
MCH: 32.2 pg (ref 26.0–34.0)
MCHC: 35 g/dL (ref 32.0–36.0)
MCV: 92 fL (ref 80.0–100.0)
MONO ABS: 0.3 10*3/uL (ref 0.2–0.9)
Monocytes Relative: 8 %
NEUTROS ABS: 3.1 10*3/uL (ref 1.4–6.5)
Neutrophils Relative %: 72 %
Platelets: 280 10*3/uL (ref 150–440)
RBC: 3.34 MIL/uL — ABNORMAL LOW (ref 3.80–5.20)
RDW: 15.6 % — ABNORMAL HIGH (ref 11.5–14.5)
WBC: 4.2 10*3/uL (ref 3.6–11.0)

## 2016-08-31 MED ORDER — PACLITAXEL CHEMO INJECTION 300 MG/50ML
80.0000 mg/m2 | Freq: Once | INTRAVENOUS | Status: AC
  Administered 2016-08-31: 150 mg via INTRAVENOUS
  Filled 2016-08-31: qty 25

## 2016-08-31 MED ORDER — DIPHENHYDRAMINE HCL 50 MG/ML IJ SOLN
25.0000 mg | Freq: Once | INTRAMUSCULAR | Status: AC
  Administered 2016-08-31: 25 mg via INTRAVENOUS
  Filled 2016-08-31: qty 1

## 2016-08-31 MED ORDER — SODIUM CHLORIDE 0.9 % IV SOLN
Freq: Once | INTRAVENOUS | Status: AC
  Administered 2016-08-31: 11:00:00 via INTRAVENOUS
  Filled 2016-08-31: qty 1000

## 2016-08-31 MED ORDER — DEXAMETHASONE SODIUM PHOSPHATE 10 MG/ML IJ SOLN
10.0000 mg | Freq: Once | INTRAMUSCULAR | Status: AC
  Administered 2016-08-31: 10 mg via INTRAVENOUS
  Filled 2016-08-31: qty 1

## 2016-08-31 MED ORDER — HEPARIN SOD (PORK) LOCK FLUSH 100 UNIT/ML IV SOLN
500.0000 [IU] | Freq: Once | INTRAVENOUS | Status: AC | PRN
  Administered 2016-08-31: 500 [IU]
  Filled 2016-08-31: qty 5

## 2016-08-31 MED ORDER — FAMOTIDINE IN NACL 20-0.9 MG/50ML-% IV SOLN
20.0000 mg | Freq: Once | INTRAVENOUS | Status: AC
  Administered 2016-08-31: 20 mg via INTRAVENOUS
  Filled 2016-08-31: qty 50

## 2016-08-31 MED ORDER — DEXAMETHASONE SODIUM PHOSPHATE 100 MG/10ML IJ SOLN: 10.0000 mg | Freq: Once | INTRAMUSCULAR | Status: DC

## 2016-09-06 NOTE — Progress Notes (Signed)
[place]  Telephone:(336) [phone] Fax:(336) [phone]  ID: Kimberly Clarke OB: [birth year]  MR#: [id number]  BWL#:[id number]  [name], MD as PCP - General (Family Medicine) Clent Jacks, RN as Registered Nurse  CHIEF COMPLAINT: Clinical stage IIa ER/PR positive, HER-2 negative invasive carcinoma of the upper outer quadrant of the right breast.  INTERVAL HISTORY: Patient returns to clinic today for further evaluation and consideration of cycle 7 of 12 of weekly Taxol. She recently completed 4 cycles of dose dense Adriamycin and Cytoxan. She is tolerating her treatments well without significant side effects. She continues to be highly anxious and reports occasional insomnia, but otherwise feels well. She has a minimal neuropathy, but does not affect her day-to-day activity. She has no other neurologic complaints. She denies any recent fevers or illnesses. She has a good appetite and denies weight loss. She has no chest pain or shortness of breath. She denies any nausea, vomiting, constipation, or diarrhea. She has no urinary complaints. Patient offers no further specific complaints today.   REVIEW OF SYSTEMS:   Review of Systems  Constitutional: Negative for fever, malaise/fatigue and weight loss.  HENT: Negative for congestion and sinus pain.   Respiratory: Negative.  Negative for cough and shortness of breath.   Cardiovascular: Negative for chest pain and leg swelling.  Gastrointestinal: Negative for abdominal pain, constipation, diarrhea, nausea and vomiting.  Genitourinary: Negative.  Negative for frequency and urgency.  Musculoskeletal: Negative.   Neurological: Positive for sensory change. Negative for dizziness, tingling, weakness and headaches.  Psychiatric/Behavioral: Negative for depression. The patient is nervous/anxious. The patient does not have insomnia.     As per HPI. Otherwise, a complete review of systems is  negative.  PAST MEDICAL HISTORY: Past Medical History:  Diagnosis Date  . Anxiety   . Cancer ([place]) 04/24/2016   right breast  . HSV infection    HISTORY OF HSV  . PONV (postoperative nausea and vomiting)    nausea no vomiting  . Post-menopausal 10/20/2005  . Postmenopause bleeding   . Thickened endometrium 05/30/2016    PAST SURGICAL HISTORY: Past Surgical History:  Procedure Laterality Date  . ANAL FISTULECTOMY    . ENDOMETRIAL BIOPSY    . KNEE SURGERY     snow skating  . PORTACATH PLACEMENT N/A 05/23/2016   Procedure: INSERTION PORT-A-CATH;  Surgeon: Leonie Green, MD;  Location: [facility] ORS;  Service: General;  Laterality: N/A;    FAMILY HISTORY: Family History  Problem Relation Age of Onset  . Cancer Mother     BREAST AND BONES  . Breast cancer Mother 4  . Emphysema Father   . Cancer Father   . Cancer Maternal Aunt     ADVANCED DIRECTIVES (Y/N):  N  HEALTH MAINTENANCE: Social History  Substance Use Topics  . Smoking status: Never Smoker  . Smokeless tobacco: Never Used  . Alcohol use 3.6 - 4.8 oz/week    3 - 4 Glasses of wine, 3 - 4 Cans of beer per week     Colonoscopy:  PAP:  Bone density:  Lipid panel:  Allergies  Allergen Reactions  . Penicillins Anaphylaxis    unknown    Current Outpatient Prescriptions  Medication Sig Dispense Refill  . BIOTIN PO Take by mouth.    . citalopram (CELEXA) 20 MG tablet Take 20 mg by mouth daily.     Marland Kitchen lidocaine-prilocaine (EMLA) cream Apply to affected area once 30 g 3  . Multiple Vitamins-Minerals (  MULTIVITAMIN ADULTS 50+) TABS Take 1 tablet by mouth daily.    . ondansetron (ZOFRAN) 8 MG tablet Take 1 tablet (8 mg total) by mouth 2 (two) times daily as needed. Start on the third day after chemotherapy. 30 tablet 1  . polycarbophil (FIBERCON) 625 MG tablet Take 625 mg by mouth 2 (two) times daily.    . prochlorperazine (COMPAZINE) 10 MG tablet Take 1 tablet (10 mg total) by mouth every 6 (six) hours as needed  (Nausea or vomiting). 30 tablet 1  . zolpidem (AMBIEN) 10 MG tablet Take 1 tablet (10 mg total) by mouth at bedtime as needed for sleep. 30 tablet 1   No current facility-administered medications for this visit.     OBJECTIVE: Vitals:   09/07/16 1025  BP: (!) 144/87  Pulse: 89  Resp: 18  Temp: 98.5 F (36.9 C)     Body mass index is 25.21 kg/m.    ECOG FS:0 - Asymptomatic  General: Well-developed, well-nourished, no acute distress. Eyes: Pink conjunctiva, anicteric sclera. Breasts: Patient requested exam be deferred today. Lungs: Clear to auscultation bilaterally. Heart: Regular rate and rhythm. No rubs, murmurs, or gallops. Abdomen: Soft, nontender, nondistended. No organomegaly noted, normoactive bowel sounds. Musculoskeletal: No edema, cyanosis, or clubbing. Neuro: Alert, answering all questions appropriately. Cranial nerves grossly intact. Skin: No rashes or petechiae noted. Psych: Normal affect.   LAB RESULTS:  Lab Results  Component Value Date   NA 138 09/07/2016   K 3.8 09/07/2016   CL 108 09/07/2016   CO2 25 09/07/2016   GLUCOSE 98 09/07/2016   BUN 10 09/07/2016   CREATININE 0.69 09/07/2016   CALCIUM 9.1 09/07/2016   PROT 6.8 09/07/2016   ALBUMIN 4.2 09/07/2016   AST 32 09/07/2016   ALT 33 09/07/2016   ALKPHOS 87 09/07/2016   BILITOT 0.5 09/07/2016   GFRNONAA >60 09/07/2016   GFRAA >60 09/07/2016    Lab Results  Component Value Date   WBC 3.6 09/07/2016   NEUTROABS 2.4 09/07/2016   HGB 10.9 (L) 09/07/2016   HCT 31.2 (L) 09/07/2016   MCV 92.2 09/07/2016   PLT 291 09/07/2016     STUDIES: No results found.  ASSESSMENT: Clinical stage IIa ER/PR positive, HER-2 negative invasive carcinoma of the upper outer quadrant of the right breast.  PLAN:    1. Clinical stage IIa ER/PR positive, HER-2 negative invasive carcinoma of the upper outer quadrant of the right breast: CT scan results reviewed independently with no obvious metastatic disease.  Patient has elected to proceed with neoadjuvant chemotherapy using Adriamycin, Cytoxan, and Taxol. Pretreatment MUGA scan revealed an EF of 74%. Patient is now completed 4 cycles of dose dense Adriamycin and Cytoxan and will proceed with cycle 7 of 12 of weekly Taxol. Patient will require surgery and radiation after the completion of her chemotherapy. She will also benefit from an aromatase inhibitor for 5 years at the completion of all treatments. Return to clinic in 1 week for Taxol only and then in 2 weeks for further evaluation and consideration of cycle 9.  2. Endometrial thickening: Patient was seen by gynecology oncology. Biopsy confirms no evidence of malignancy.  3. Constipation: Resolved. Continue OTC treatments as needed. 4. Anemia: Mild, monitor.   Patient expressed understanding and was in agreement with this plan. She also understands that She can call clinic at any time with any questions, concerns, or complaints.   Cancer Staging Primary cancer of upper outer quadrant of right female breast [facility]) Staging form: Breast,  AJCC 7th Edition - Clinical stage from 04/26/2016: Stage IIA (T1c, N1, M0) - Signed by Lloyd Huger, MD on 05/25/2016    Lloyd Huger, MD 09/07/16 5:45 PM

## 2016-09-07 ENCOUNTER — Inpatient Hospital Stay: Payer: Commercial Managed Care - HMO

## 2016-09-07 ENCOUNTER — Inpatient Hospital Stay (HOSPITAL_BASED_OUTPATIENT_CLINIC_OR_DEPARTMENT_OTHER): Payer: Commercial Managed Care - HMO | Admitting: Oncology

## 2016-09-07 VITALS — BP 144/87 | HR 89 | Temp 98.5°F | Resp 18 | Wt 165.8 lb

## 2016-09-07 DIAGNOSIS — Z809 Family history of malignant neoplasm, unspecified: Secondary | ICD-10-CM | POA: Diagnosis not present

## 2016-09-07 DIAGNOSIS — C50411 Malignant neoplasm of upper-outer quadrant of right female breast: Secondary | ICD-10-CM

## 2016-09-07 DIAGNOSIS — Z170 Estrogen receptor positive status [ER+]: Secondary | ICD-10-CM

## 2016-09-07 DIAGNOSIS — D649 Anemia, unspecified: Secondary | ICD-10-CM | POA: Diagnosis not present

## 2016-09-07 DIAGNOSIS — R938 Abnormal findings on diagnostic imaging of other specified body structures: Secondary | ICD-10-CM

## 2016-09-07 DIAGNOSIS — F419 Anxiety disorder, unspecified: Secondary | ICD-10-CM | POA: Diagnosis not present

## 2016-09-07 DIAGNOSIS — G4700 Insomnia, unspecified: Secondary | ICD-10-CM | POA: Diagnosis not present

## 2016-09-07 DIAGNOSIS — Z79899 Other long term (current) drug therapy: Secondary | ICD-10-CM | POA: Diagnosis not present

## 2016-09-07 LAB — CBC WITH DIFFERENTIAL/PLATELET
Basophils Absolute: 0.1 10*3/uL (ref 0–0.1)
Basophils Relative: 2 %
Eosinophils Absolute: 0.2 10*3/uL (ref 0–0.7)
Eosinophils Relative: 4 %
HEMATOCRIT: 31.2 % — AB (ref 35.0–47.0)
Hemoglobin: 10.9 g/dL — ABNORMAL LOW (ref 12.0–16.0)
LYMPHS PCT: 18 %
Lymphs Abs: 0.7 10*3/uL — ABNORMAL LOW (ref 1.0–3.6)
MCH: 32.1 pg (ref 26.0–34.0)
MCHC: 34.8 g/dL (ref 32.0–36.0)
MCV: 92.2 fL (ref 80.0–100.0)
MONO ABS: 0.3 10*3/uL (ref 0.2–0.9)
MONOS PCT: 9 %
NEUTROS ABS: 2.4 10*3/uL (ref 1.4–6.5)
Neutrophils Relative %: 67 %
Platelets: 291 10*3/uL (ref 150–440)
RBC: 3.38 MIL/uL — ABNORMAL LOW (ref 3.80–5.20)
RDW: 15.2 % — AB (ref 11.5–14.5)
WBC: 3.6 10*3/uL (ref 3.6–11.0)

## 2016-09-07 LAB — COMPREHENSIVE METABOLIC PANEL
ALT: 33 U/L (ref 14–54)
ANION GAP: 5 (ref 5–15)
AST: 32 U/L (ref 15–41)
Albumin: 4.2 g/dL (ref 3.5–5.0)
Alkaline Phosphatase: 87 U/L (ref 38–126)
BILIRUBIN TOTAL: 0.5 mg/dL (ref 0.3–1.2)
BUN: 10 mg/dL (ref 6–20)
CO2: 25 mmol/L (ref 22–32)
Calcium: 9.1 mg/dL (ref 8.9–10.3)
Chloride: 108 mmol/L (ref 101–111)
Creatinine, Ser: 0.69 mg/dL (ref 0.44–1.00)
GFR calc Af Amer: >60 mL/min (ref >60)
Glucose, Bld: 98 mg/dL (ref 65–99)
POTASSIUM: 3.8 mmol/L (ref 3.5–5.1)
Sodium: 138 mmol/L (ref 135–145)
TOTAL PROTEIN: 6.8 g/dL (ref 6.5–8.1)

## 2016-09-07 MED ORDER — DIPHENHYDRAMINE HCL 50 MG/ML IJ SOLN
25.0000 mg | Freq: Once | INTRAMUSCULAR | Status: AC
  Administered 2016-09-07: 25 mg via INTRAVENOUS
  Filled 2016-09-07: qty 1

## 2016-09-07 MED ORDER — DEXAMETHASONE SODIUM PHOSPHATE 10 MG/ML IJ SOLN
10.0000 mg | Freq: Once | INTRAMUSCULAR | Status: AC
  Administered 2016-09-07: 10 mg via INTRAVENOUS
  Filled 2016-09-07: qty 1

## 2016-09-07 MED ORDER — HEPARIN SOD (PORK) LOCK FLUSH 100 UNIT/ML IV SOLN
500.0000 [IU] | Freq: Once | INTRAVENOUS | Status: AC | PRN
  Administered 2016-09-07: 500 [IU]
  Filled 2016-09-07: qty 5

## 2016-09-07 MED ORDER — SODIUM CHLORIDE 0.9 % IV SOLN
10.0000 mg | Freq: Once | INTRAVENOUS | Status: DC
Start: 2016-09-07 — End: 2016-09-07

## 2016-09-07 MED ORDER — FAMOTIDINE IN NACL 20-0.9 MG/50ML-% IV SOLN
20.0000 mg | Freq: Once | INTRAVENOUS | Status: AC
  Administered 2016-09-07: 20 mg via INTRAVENOUS
  Filled 2016-09-07: qty 50

## 2016-09-07 MED ORDER — SODIUM CHLORIDE 0.9 % IV SOLN
80.0000 mg/m2 | Freq: Once | INTRAVENOUS | Status: AC
  Administered 2016-09-07: 150 mg via INTRAVENOUS
  Filled 2016-09-07: qty 25

## 2016-09-07 MED ORDER — SODIUM CHLORIDE 0.9 % IV SOLN
Freq: Once | INTRAVENOUS | Status: AC
  Administered 2016-09-07: 11:00:00 via INTRAVENOUS
  Filled 2016-09-07: qty 1000

## 2016-09-07 NOTE — Progress Notes (Signed)
Offers no complaints  

## 2016-09-14 ENCOUNTER — Inpatient Hospital Stay: Payer: Commercial Managed Care - HMO

## 2016-09-14 VITALS — BP 136/84 | HR 80 | Temp 98.0°F | Resp 18

## 2016-09-14 DIAGNOSIS — C50411 Malignant neoplasm of upper-outer quadrant of right female breast: Secondary | ICD-10-CM

## 2016-09-14 LAB — COMPREHENSIVE METABOLIC PANEL
ALK PHOS: 85 U/L (ref 38–126)
ALT: 35 U/L (ref 14–54)
AST: 32 U/L (ref 15–41)
Albumin: 4.1 g/dL (ref 3.5–5.0)
Anion gap: 5 (ref 5–15)
BUN: 14 mg/dL (ref 6–20)
CALCIUM: 9.2 mg/dL (ref 8.9–10.3)
CO2: 25 mmol/L (ref 22–32)
CREATININE: 0.53 mg/dL (ref 0.44–1.00)
Chloride: 107 mmol/L (ref 101–111)
GFR calc Af Amer: >60 mL/min (ref >60)
GFR calc non Af Amer: >60 mL/min (ref >60)
Glucose, Bld: 99 mg/dL (ref 65–99)
Potassium: 3.9 mmol/L (ref 3.5–5.1)
SODIUM: 137 mmol/L (ref 135–145)
Total Bilirubin: 0.5 mg/dL (ref 0.3–1.2)
Total Protein: 7 g/dL (ref 6.5–8.1)

## 2016-09-14 LAB — CBC WITH DIFFERENTIAL/PLATELET
Basophils Absolute: 0 10*3/uL (ref 0–0.1)
Basophils Relative: 1 %
EOS ABS: 0.1 10*3/uL (ref 0–0.7)
Eosinophils Relative: 3 %
HCT: 32.1 % — ABNORMAL LOW (ref 35.0–47.0)
HEMOGLOBIN: 11.2 g/dL — AB (ref 12.0–16.0)
LYMPHS ABS: 0.5 10*3/uL — AB (ref 1.0–3.6)
LYMPHS PCT: 13 %
MCH: 32.1 pg (ref 26.0–34.0)
MCHC: 34.8 g/dL (ref 32.0–36.0)
MCV: 92.2 fL (ref 80.0–100.0)
Monocytes Absolute: 0.4 10*3/uL (ref 0.2–0.9)
Monocytes Relative: 9 %
NEUTROS ABS: 2.8 10*3/uL (ref 1.4–6.5)
NEUTROS PCT: 74 %
Platelets: 312 10*3/uL (ref 150–440)
RBC: 3.48 MIL/uL — AB (ref 3.80–5.20)
RDW: 14.3 % (ref 11.5–14.5)
WBC: 3.8 10*3/uL (ref 3.6–11.0)

## 2016-09-14 MED ORDER — FAMOTIDINE IN NACL 20-0.9 MG/50ML-% IV SOLN
20.0000 mg | Freq: Once | INTRAVENOUS | Status: AC
  Administered 2016-09-14: 20 mg via INTRAVENOUS
  Filled 2016-09-14: qty 50

## 2016-09-14 MED ORDER — SODIUM CHLORIDE 0.9 % IV SOLN
Freq: Once | INTRAVENOUS | Status: AC
  Administered 2016-09-14: 12:00:00 via INTRAVENOUS
  Filled 2016-09-14: qty 1000

## 2016-09-14 MED ORDER — DEXAMETHASONE SODIUM PHOSPHATE 10 MG/ML IJ SOLN
10.0000 mg | Freq: Once | INTRAMUSCULAR | Status: AC
  Administered 2016-09-14: 10 mg via INTRAVENOUS
  Filled 2016-09-14: qty 1

## 2016-09-14 MED ORDER — SODIUM CHLORIDE 0.9% FLUSH
10.0000 mL | Freq: Once | INTRAVENOUS | Status: AC
  Administered 2016-09-14: 10 mL via INTRAVENOUS
  Filled 2016-09-14: qty 10

## 2016-09-14 MED ORDER — HEPARIN SOD (PORK) LOCK FLUSH 100 UNIT/ML IV SOLN
500.0000 [IU] | Freq: Once | INTRAVENOUS | Status: AC
  Administered 2016-09-14: 500 [IU] via INTRAVENOUS
  Filled 2016-09-14: qty 5

## 2016-09-14 MED ORDER — SODIUM CHLORIDE 0.9 % IV SOLN
80.0000 mg/m2 | Freq: Once | INTRAVENOUS | Status: AC
  Administered 2016-09-14: 150 mg via INTRAVENOUS
  Filled 2016-09-14: qty 25

## 2016-09-14 MED ORDER — DIPHENHYDRAMINE HCL 50 MG/ML IJ SOLN
25.0000 mg | Freq: Once | INTRAMUSCULAR | Status: AC
  Administered 2016-09-14: 25 mg via INTRAVENOUS
  Filled 2016-09-14: qty 1

## 2016-09-14 MED ORDER — SODIUM CHLORIDE 0.9 % IV SOLN: 10.0000 mg | Freq: Once | INTRAVENOUS | Status: DC

## 2016-09-14 MED ORDER — SODIUM CHLORIDE 0.9% FLUSH
10.0000 mL | Freq: Once | INTRAVENOUS | Status: AC
Start: 2016-09-14 — End: 2016-09-14
  Administered 2016-09-14: 10 mL via INTRAVENOUS
  Filled 2016-09-14: qty 10

## 2016-09-20 NOTE — Progress Notes (Signed)
[place]  Telephone:(336) [phone] Fax:(336) [phone]  ID: Kimberly Clarke OB: 12-19-1955  MR#: [id number]  AYT#:[id number]  [name], MD as PCP - General (Family Medicine) Clent Jacks, RN as Registered Nurse  CHIEF COMPLAINT: Clinical stage IIa ER/PR positive, HER-2 negative invasive carcinoma of the upper outer quadrant of the right breast.  INTERVAL HISTORY: Patient returns to clinic today for further evaluation and consideration of cycle 9 of 12 of weekly Taxol. She is tolerating her treatments well without significant side effects. She does admit to mild intermittent peripheral neuropathy. She continues to be highly anxious. She has no other neurologic complaints. She denies any recent fevers or illnesses. She has a good appetite and denies weight loss. She has no chest pain or shortness of breath. She denies any nausea, vomiting, constipation, or diarrhea. She has no urinary complaints. Patient offers no further specific complaints today.   REVIEW OF SYSTEMS:   Review of Systems  Constitutional: Negative for fever, malaise/fatigue and weight loss.  HENT: Negative for congestion and sinus pain.   Respiratory: Negative.  Negative for cough and shortness of breath.   Cardiovascular: Negative for chest pain and leg swelling.  Gastrointestinal: Negative for abdominal pain, constipation, diarrhea, nausea and vomiting.  Genitourinary: Negative.  Negative for frequency and urgency.  Musculoskeletal: Negative.   Neurological: Positive for sensory change. Negative for dizziness, tingling, weakness and headaches.  Psychiatric/Behavioral: Negative for depression. The patient is nervous/anxious. The patient does not have insomnia.     As per HPI. Otherwise, a complete review of systems is negative.  PAST MEDICAL HISTORY: Past Medical History:  Diagnosis Date  . Anxiety   . Cancer ([place]) 04/24/2016   right breast  . HSV infection    HISTORY OF HSV  . PONV (postoperative nausea and vomiting)    nausea no vomiting  . Post-menopausal 10/20/2005  . Postmenopause bleeding   . Thickened endometrium 05/30/2016    PAST SURGICAL HISTORY: Past Surgical History:  Procedure Laterality Date  . ANAL FISTULECTOMY    . ENDOMETRIAL BIOPSY    . KNEE SURGERY     snow skating  . PORTACATH PLACEMENT N/A 05/23/2016   Procedure: INSERTION PORT-A-CATH;  Surgeon: Leonie Green, MD;  Location: [facility] ORS;  Service: General;  Laterality: N/A;    FAMILY HISTORY: Family History  Problem Relation Age of Onset  . Cancer Mother     BREAST AND BONES  . Breast cancer Mother 47  . Emphysema Father   . Cancer Father   . Cancer Maternal Aunt     ADVANCED DIRECTIVES (Y/N):  N  HEALTH MAINTENANCE: Social History  Substance Use Topics  . Smoking status: Never Smoker  . Smokeless tobacco: Never Used  . Alcohol use 3.6 - 4.8 oz/week    3 - 4 Glasses of wine, 3 - 4 Cans of beer per week     Colonoscopy:  PAP:  Bone density:  Lipid panel:  Allergies  Allergen Reactions  . Penicillins Anaphylaxis    unknown    Current Outpatient Prescriptions  Medication Sig Dispense Refill  . BIOTIN PO Take by mouth.    . citalopram (CELEXA) 20 MG tablet Take 20 mg by mouth daily.     Marland Kitchen lidocaine-prilocaine (EMLA) cream Apply to affected area once 30 g 3  . Multiple Vitamins-Minerals (MULTIVITAMIN ADULTS 50+) TABS Take 1 tablet by mouth daily.    . ondansetron (ZOFRAN) 8 MG tablet Take 1 tablet (8 mg  total) by mouth 2 (two) times daily as needed. Start on the third day after chemotherapy. 30 tablet 1  . polycarbophil (FIBERCON) 625 MG tablet Take 625 mg by mouth 2 (two) times daily.    . prochlorperazine (COMPAZINE) 10 MG tablet Take 1 tablet (10 mg total) by mouth every 6 (six) hours as needed (Nausea or vomiting). 30 tablet 1  . zolpidem (AMBIEN) 10 MG tablet      No current facility-administered medications for this visit.     Facility-Administered Medications Ordered in Other Visits  Medication Dose Route Frequency Provider Last Rate Last Dose  . heparin lock flush 100 unit/mL  500 Units Intravenous Once Lloyd Huger, MD      . sodium chloride flush (NS) 0.9 % injection 10 mL  10 mL Intravenous PRN Lloyd Huger, MD   10 mL at 09/21/16 0931    OBJECTIVE: Vitals:   09/21/16 0949  BP: 126/86  Pulse: (!) 101  Temp: 97.5 F (36.4 C)     Body mass index is 23.83 kg/m.    ECOG FS:0 - Asymptomatic  General: Well-developed, well-nourished, no acute distress. Eyes: Pink conjunctiva, anicteric sclera. Breasts: Patient requested exam be deferred today. Lungs: Clear to auscultation bilaterally. Heart: Regular rate and rhythm. No rubs, murmurs, or gallops. Abdomen: Soft, nontender, nondistended. No organomegaly noted, normoactive bowel sounds. Musculoskeletal: No edema, cyanosis, or clubbing. Neuro: Alert, answering all questions appropriately. Cranial nerves grossly intact. Skin: No rashes or petechiae noted. Psych: Normal affect.   LAB RESULTS:  Lab Results  Component Value Date   NA 139 09/21/2016   K 3.9 09/21/2016   CL 108 09/21/2016   CO2 28 09/21/2016   GLUCOSE 102 (H) 09/21/2016   BUN 12 09/21/2016   CREATININE 0.61 09/21/2016   CALCIUM 9.5 09/21/2016   PROT 6.9 09/21/2016   ALBUMIN 3.9 09/21/2016   AST 26 09/21/2016   ALT 26 09/21/2016   ALKPHOS 81 09/21/2016   BILITOT 0.5 09/21/2016   GFRNONAA >60 09/21/2016   GFRAA >60 09/21/2016    Lab Results  Component Value Date   WBC 3.3 (L) 09/21/2016   NEUTROABS 2.1 09/21/2016   HGB 10.9 (L) 09/21/2016   HCT 31.0 (L) 09/21/2016   MCV 91.6 09/21/2016   PLT 296 09/21/2016     STUDIES: No results found.  ASSESSMENT: Clinical stage IIa ER/PR positive, HER-2 negative invasive carcinoma of the upper outer quadrant of the right breast.  PLAN:    1. Clinical stage IIa ER/PR positive, HER-2 negative invasive carcinoma of the  upper outer quadrant of the right breast: CT scan results reviewed independently with no obvious metastatic disease. Patient has elected to proceed with neoadjuvant chemotherapy using Adriamycin, Cytoxan, and Taxol. Pretreatment MUGA scan revealed an EF of 74%. Patient is now completed 4 cycles of dose dense Adriamycin and Cytoxan. Proceed with cycle 9 of 12 of weekly Taxol. Patient will require surgery and radiation after the completion of her chemotherapy. She will also benefit from an aromatase inhibitor for 5 years at the completion of all treatments. Return to clinic in 1 week for Taxol only and then in 2 weeks for further evaluation and consideration of cycle 11. Patient will require breast exam at next clinic visit. 2. Endometrial thickening: Patient was seen by gynecology oncology. Biopsy confirms no evidence of malignancy.  3. Constipation: Resolved. Continue OTC treatments as needed. 4. Anemia: Mild, monitor.   Patient expressed understanding and was in agreement with this plan. She also understands  that She can call clinic at any time with any questions, concerns, or complaints.   Cancer Staging Primary cancer of upper outer quadrant of right female breast [facility]) Staging form: Breast, AJCC 7th Edition - Clinical stage from 04/26/2016: Stage IIA (T1c, N1, M0) - Signed by Lloyd Huger, MD on 05/25/2016    Lloyd Huger, MD 09/21/16 10:30 AM

## 2016-09-21 ENCOUNTER — Inpatient Hospital Stay (HOSPITAL_BASED_OUTPATIENT_CLINIC_OR_DEPARTMENT_OTHER): Payer: Commercial Managed Care - HMO | Admitting: Oncology

## 2016-09-21 ENCOUNTER — Inpatient Hospital Stay: Payer: Commercial Managed Care - HMO

## 2016-09-21 ENCOUNTER — Inpatient Hospital Stay: Payer: Commercial Managed Care - HMO | Attending: Oncology

## 2016-09-21 VITALS — BP 126/86 | HR 101 | Temp 97.5°F | Ht 69.0 in | Wt 161.4 lb

## 2016-09-21 DIAGNOSIS — R938 Abnormal findings on diagnostic imaging of other specified body structures: Secondary | ICD-10-CM

## 2016-09-21 DIAGNOSIS — Z170 Estrogen receptor positive status [ER+]: Secondary | ICD-10-CM | POA: Diagnosis not present

## 2016-09-21 DIAGNOSIS — Z5111 Encounter for antineoplastic chemotherapy: Secondary | ICD-10-CM | POA: Diagnosis not present

## 2016-09-21 DIAGNOSIS — D649 Anemia, unspecified: Secondary | ICD-10-CM

## 2016-09-21 DIAGNOSIS — F419 Anxiety disorder, unspecified: Secondary | ICD-10-CM | POA: Insufficient documentation

## 2016-09-21 DIAGNOSIS — Z803 Family history of malignant neoplasm of breast: Secondary | ICD-10-CM

## 2016-09-21 DIAGNOSIS — C50411 Malignant neoplasm of upper-outer quadrant of right female breast: Secondary | ICD-10-CM | POA: Diagnosis not present

## 2016-09-21 DIAGNOSIS — G629 Polyneuropathy, unspecified: Secondary | ICD-10-CM

## 2016-09-21 DIAGNOSIS — Z79899 Other long term (current) drug therapy: Secondary | ICD-10-CM | POA: Diagnosis not present

## 2016-09-21 DIAGNOSIS — Z809 Family history of malignant neoplasm, unspecified: Secondary | ICD-10-CM

## 2016-09-21 LAB — CBC WITH DIFFERENTIAL/PLATELET
BASOS ABS: 0 10*3/uL (ref 0–0.1)
BASOS PCT: 1 %
EOS ABS: 0.1 10*3/uL (ref 0–0.7)
Eosinophils Relative: 4 %
HCT: 31 % — ABNORMAL LOW (ref 35.0–47.0)
Hemoglobin: 10.9 g/dL — ABNORMAL LOW (ref 12.0–16.0)
LYMPHS PCT: 19 %
Lymphs Abs: 0.6 10*3/uL — ABNORMAL LOW (ref 1.0–3.6)
MCH: 32.2 pg (ref 26.0–34.0)
MCHC: 35.2 g/dL (ref 32.0–36.0)
MCV: 91.6 fL (ref 80.0–100.0)
MONO ABS: 0.4 10*3/uL (ref 0.2–0.9)
Monocytes Relative: 11 %
NEUTROS ABS: 2.1 10*3/uL (ref 1.4–6.5)
Neutrophils Relative %: 65 %
PLATELETS: 296 10*3/uL (ref 150–440)
RBC: 3.39 MIL/uL — ABNORMAL LOW (ref 3.80–5.20)
RDW: 13.9 % (ref 11.5–14.5)
WBC: 3.3 10*3/uL — ABNORMAL LOW (ref 3.6–11.0)

## 2016-09-21 LAB — COMPREHENSIVE METABOLIC PANEL
ALT: 26 U/L (ref 14–54)
AST: 26 U/L (ref 15–41)
Albumin: 3.9 g/dL (ref 3.5–5.0)
Alkaline Phosphatase: 81 U/L (ref 38–126)
Anion gap: 3 — ABNORMAL LOW (ref 5–15)
BUN: 12 mg/dL (ref 6–20)
CHLORIDE: 108 mmol/L (ref 101–111)
CO2: 28 mmol/L (ref 22–32)
Calcium: 9.5 mg/dL (ref 8.9–10.3)
Creatinine, Ser: 0.61 mg/dL (ref 0.44–1.00)
GFR calc Af Amer: >60 mL/min (ref >60)
Glucose, Bld: 102 mg/dL — ABNORMAL HIGH (ref 65–99)
POTASSIUM: 3.9 mmol/L (ref 3.5–5.1)
Sodium: 139 mmol/L (ref 135–145)
Total Bilirubin: 0.5 mg/dL (ref 0.3–1.2)
Total Protein: 6.9 g/dL (ref 6.5–8.1)

## 2016-09-21 MED ORDER — FAMOTIDINE IN NACL 20-0.9 MG/50ML-% IV SOLN
20.0000 mg | Freq: Once | INTRAVENOUS | Status: AC
  Administered 2016-09-21: 20 mg via INTRAVENOUS
  Filled 2016-09-21: qty 50

## 2016-09-21 MED ORDER — SODIUM CHLORIDE 0.9 % IV SOLN: 10.0000 mg | Freq: Once | INTRAVENOUS | Status: DC

## 2016-09-21 MED ORDER — DIPHENHYDRAMINE HCL 50 MG/ML IJ SOLN
25.0000 mg | Freq: Once | INTRAMUSCULAR | Status: AC
  Administered 2016-09-21: 25 mg via INTRAVENOUS
  Filled 2016-09-21: qty 1

## 2016-09-21 MED ORDER — SODIUM CHLORIDE 0.9 % IV SOLN
Freq: Once | INTRAVENOUS | Status: AC
  Administered 2016-09-21: 11:00:00 via INTRAVENOUS
  Filled 2016-09-21: qty 1000

## 2016-09-21 MED ORDER — HEPARIN SOD (PORK) LOCK FLUSH 100 UNIT/ML IV SOLN: 500.0000 [IU] | Freq: Once | INTRAVENOUS | Status: DC | PRN

## 2016-09-21 MED ORDER — PACLITAXEL CHEMO INJECTION 300 MG/50ML
72.0000 mg/m2 | Freq: Once | INTRAVENOUS | Status: AC
  Administered 2016-09-21: 132 mg via INTRAVENOUS
  Filled 2016-09-21: qty 22

## 2016-09-21 MED ORDER — DEXAMETHASONE SODIUM PHOSPHATE 10 MG/ML IJ SOLN
10.0000 mg | Freq: Once | INTRAMUSCULAR | Status: AC
  Administered 2016-09-21: 10 mg via INTRAVENOUS
  Filled 2016-09-21: qty 1

## 2016-09-21 MED ORDER — HEPARIN SOD (PORK) LOCK FLUSH 100 UNIT/ML IV SOLN
500.0000 [IU] | Freq: Once | INTRAVENOUS | Status: AC
  Administered 2016-09-21: 500 [IU] via INTRAVENOUS
  Filled 2016-09-21: qty 5

## 2016-09-21 MED ORDER — SODIUM CHLORIDE 0.9% FLUSH
10.0000 mL | INTRAVENOUS | Status: DC | PRN
  Administered 2016-09-21: 10 mL via INTRAVENOUS
  Filled 2016-09-21: qty 10

## 2016-09-21 NOTE — Progress Notes (Signed)
Patient here for follow up. No concerns today. No changes since last appt.

## 2016-09-28 ENCOUNTER — Inpatient Hospital Stay: Payer: Commercial Managed Care - HMO

## 2016-09-28 ENCOUNTER — Other Ambulatory Visit: Payer: Commercial Managed Care - HMO

## 2016-09-28 ENCOUNTER — Ambulatory Visit: Payer: Commercial Managed Care - HMO

## 2016-09-28 DIAGNOSIS — C50411 Malignant neoplasm of upper-outer quadrant of right female breast: Secondary | ICD-10-CM

## 2016-09-28 LAB — COMPREHENSIVE METABOLIC PANEL
ALK PHOS: 78 U/L (ref 38–126)
ALT: 25 U/L (ref 14–54)
AST: 28 U/L (ref 15–41)
Albumin: 3.8 g/dL (ref 3.5–5.0)
Anion gap: 4 — ABNORMAL LOW (ref 5–15)
BILIRUBIN TOTAL: 0.4 mg/dL (ref 0.3–1.2)
BUN: 15 mg/dL (ref 6–20)
CO2: 26 mmol/L (ref 22–32)
CREATININE: 0.64 mg/dL (ref 0.44–1.00)
Calcium: 9.4 mg/dL (ref 8.9–10.3)
Chloride: 108 mmol/L (ref 101–111)
GFR calc Af Amer: >60 mL/min (ref >60)
Glucose, Bld: 69 mg/dL (ref 65–99)
Potassium: 4.1 mmol/L (ref 3.5–5.1)
Sodium: 138 mmol/L (ref 135–145)
Total Protein: 7 g/dL (ref 6.5–8.1)

## 2016-09-28 LAB — CBC WITH DIFFERENTIAL/PLATELET
BASOS ABS: 0.1 10*3/uL (ref 0–0.1)
Basophils Relative: 2 %
Eosinophils Absolute: 0.2 10*3/uL (ref 0–0.7)
Eosinophils Relative: 4 %
HEMATOCRIT: 32.5 % — AB (ref 35.0–47.0)
HEMOGLOBIN: 11.2 g/dL — AB (ref 12.0–16.0)
LYMPHS PCT: 15 %
Lymphs Abs: 0.6 10*3/uL — ABNORMAL LOW (ref 1.0–3.6)
MCH: 31.7 pg (ref 26.0–34.0)
MCHC: 34.5 g/dL (ref 32.0–36.0)
MCV: 91.8 fL (ref 80.0–100.0)
Monocytes Absolute: 0.4 10*3/uL (ref 0.2–0.9)
Monocytes Relative: 12 %
NEUTROS ABS: 2.6 10*3/uL (ref 1.4–6.5)
Neutrophils Relative %: 67 %
Platelets: 327 10*3/uL (ref 150–440)
RBC: 3.53 MIL/uL — AB (ref 3.80–5.20)
RDW: 13.4 % (ref 11.5–14.5)
WBC: 3.9 10*3/uL (ref 3.6–11.0)

## 2016-09-28 MED ORDER — HEPARIN SOD (PORK) LOCK FLUSH 100 UNIT/ML IV SOLN
500.0000 [IU] | Freq: Once | INTRAVENOUS | Status: AC
  Administered 2016-09-28: 500 [IU] via INTRAVENOUS
  Filled 2016-09-28: qty 5

## 2016-09-28 MED ORDER — SODIUM CHLORIDE 0.9% FLUSH
10.0000 mL | Freq: Once | INTRAVENOUS | Status: AC
  Administered 2016-09-28: 10 mL via INTRAVENOUS
  Filled 2016-09-28: qty 10

## 2016-09-28 NOTE — Progress Notes (Signed)
Holding treatment today per Dr. Grayland Ormond due to patient having increased neuropathy.

## 2016-10-03 NOTE — Progress Notes (Signed)
[place]  [phone] [phone]  ID: Kimberly Clarke OB: Dec 26, 1955  MR#: [id number]  NTI#:[id number]  [name], MD as PCP - General (Family Medicine) Clent Jacks, RN as Registered Nurse  CHIEF COMPLAINT: Clinical stage IIa ER/PR positive, HER-2 negative invasive carcinoma of the upper outer quadrant of the right breast.  INTERVAL HISTORY: Patient returns to clinic today for further evaluation and consideration of cycle 11 of 12 of weekly Taxol. Her peripheral neuropathy has mildly improved. She currently feels well and is asymptomatic. She continues to be highly anxious. She has no other neurologic complaints. She denies any recent fevers or illnesses. She has a good appetite and denies weight loss. She has no chest pain or shortness of breath. She denies any nausea, vomiting, constipation, or diarrhea. She has no urinary complaints. Patient offers no further specific complaints today.   REVIEW OF SYSTEMS:   Review of Systems  Constitutional: Negative for fever, malaise/fatigue and weight loss.  HENT: Negative for congestion and sinus pain.   Respiratory: Negative.  Negative for cough and shortness of breath.   Cardiovascular: Negative for chest pain and leg swelling.  Gastrointestinal: Negative for abdominal pain, constipation, diarrhea, nausea and vomiting.  Genitourinary: Negative.  Negative for frequency and urgency.  Musculoskeletal: Negative.   Neurological: Positive for sensory change. Negative for dizziness, tingling, weakness and headaches.  Psychiatric/Behavioral: Negative for depression. The patient is nervous/anxious. The patient does not have insomnia.     As per HPI. Otherwise, a complete review of systems is negative.  PAST MEDICAL HISTORY: Past Medical History:  Diagnosis Date  . Anxiety   . Cancer ([place]) 04/24/2016   right breast  . HSV infection    HISTORY OF HSV  . PONV  (postoperative nausea and vomiting)    nausea no vomiting  . Post-menopausal 10/20/2005  . Postmenopause bleeding   . Thickened endometrium 05/30/2016    PAST SURGICAL HISTORY: Past Surgical History:  Procedure Laterality Date  . ANAL FISTULECTOMY    . ENDOMETRIAL BIOPSY    . KNEE SURGERY     snow skating  . PORTACATH PLACEMENT N/A 05/23/2016   Procedure: INSERTION PORT-A-CATH;  Surgeon: Leonie Green, MD;  Location: [facility] ORS;  Service: General;  Laterality: N/A;    FAMILY HISTORY: Family History  Problem Relation Age of Onset  . Cancer Mother        BREAST AND BONES  . Breast cancer Mother 34  . Emphysema Father   . Cancer Father   . Cancer Maternal Aunt     ADVANCED DIRECTIVES (Y/N):  N  HEALTH MAINTENANCE: Social History  Substance Use Topics  . Smoking status: Never Smoker  . Smokeless tobacco: Never Used  . Alcohol use 3.6 - 4.8 oz/week    3 - 4 Glasses of wine, 3 - 4 Cans of beer per week     Colonoscopy:  PAP:  Bone density:  Lipid panel:  Allergies  Allergen Reactions  . Penicillins Anaphylaxis    unknown    Current Outpatient Prescriptions  Medication Sig Dispense Refill  . BIOTIN PO Take by mouth.    . citalopram (CELEXA) 20 MG tablet Take 20 mg by mouth daily.     Marland Kitchen lidocaine-prilocaine (EMLA) cream Apply to affected area once 30 g 3  . Multiple Vitamins-Minerals (MULTIVITAMIN ADULTS 50+) TABS Take 1 tablet by mouth daily.    . ondansetron (ZOFRAN) 8 MG tablet Take 1 tablet (8 mg total)  by mouth 2 (two) times daily as needed. Start on the third day after chemotherapy. 30 tablet 1  . polycarbophil (FIBERCON) 625 MG tablet Take 625 mg by mouth 2 (two) times daily.    . prochlorperazine (COMPAZINE) 10 MG tablet Take 1 tablet (10 mg total) by mouth every 6 (six) hours as needed (Nausea or vomiting). 30 tablet 1  . zolpidem (AMBIEN) 10 MG tablet      No current facility-administered medications for this visit.     OBJECTIVE: Vitals:    10/05/16 1011  BP: 135/87  Pulse: 76  Resp: 20  Temp: 98.9 F (37.2 C)     Body mass index is 24.07 kg/m.    ECOG FS:0 - Asymptomatic  General: Well-developed, well-nourished, no acute distress. Eyes: Pink conjunctiva, anicteric sclera. Breasts: Right breast with minimally palpable disease. No palpable axillary lymphadenopathy. Lungs: Clear to auscultation bilaterally. Heart: Regular rate and rhythm. No rubs, murmurs, or gallops. Abdomen: Soft, nontender, nondistended. No organomegaly noted, normoactive bowel sounds. Musculoskeletal: No edema, cyanosis, or clubbing. Neuro: Alert, answering all questions appropriately. Cranial nerves grossly intact. Skin: No rashes or petechiae noted. Psych: Normal affect.   LAB RESULTS:  Lab Results  Component Value Date   NA 139 10/05/2016   K 3.9 10/05/2016   CL 108 10/05/2016   CO2 25 10/05/2016   GLUCOSE 91 10/05/2016   BUN 13 10/05/2016   CREATININE 0.70 10/05/2016   CALCIUM 9.4 10/05/2016   PROT 6.7 10/05/2016   ALBUMIN 4.0 10/05/2016   AST 29 10/05/2016   ALT 24 10/05/2016   ALKPHOS 83 10/05/2016   BILITOT 0.3 10/05/2016   GFRNONAA >60 10/05/2016   GFRAA >60 10/05/2016    Lab Results  Component Value Date   WBC 4.7 10/05/2016   NEUTROABS 2.9 10/05/2016   HGB 11.6 (L) 10/05/2016   HCT 33.2 (L) 10/05/2016   MCV 91.1 10/05/2016   PLT 318 10/05/2016     STUDIES: No results found.  ASSESSMENT: Clinical stage IIa ER/PR positive, HER-2 negative invasive carcinoma of the upper outer quadrant of the right breast.  PLAN:    1. Clinical stage IIa ER/PR positive, HER-2 negative invasive carcinoma of the upper outer quadrant of the right breast: CT scan results reviewed independently with no obvious metastatic disease. Patient has elected to proceed with neoadjuvant chemotherapy using Adriamycin, Cytoxan, and Taxol. Pretreatment MUGA scan revealed an EF of 74%. Patient is now completed 4 cycles of dose dense Adriamycin and  Cytoxan. Proceed with cycle 11 of 12 of weekly Taxol. Patient will require surgery and radiation after the completion of her chemotherapy. She will also benefit from an aromatase inhibitor for 5 years at the completion of all treatments. Return to clinic in 1 week for for cycle 12 and then the last week of June, postoperatively, for further evaluation and discussion of her final pathology results.  2. Endometrial thickening: Patient was seen by gynecology oncology. Biopsy confirms no evidence of malignancy.  3. Constipation: Resolved. Continue OTC treatments as needed. 4. Anemia: Mild, monitor. 5. Referral neuropathy: Taxol has been dose reduced. Last treatment is next week.   Patient expressed understanding and was in agreement with this plan. She also understands that She can call clinic at any time with any questions, concerns, or complaints.   Cancer Staging Primary cancer of upper outer quadrant of right female breast [facility]) Staging form: Breast, AJCC 7th Edition - Clinical stage from 04/26/2016: Stage IIA (T1c, N1, M0) - Signed by Lloyd Huger,  MD on 05/25/2016    Lloyd Huger, MD 10/07/16 9:21 AM

## 2016-10-05 ENCOUNTER — Inpatient Hospital Stay (HOSPITAL_BASED_OUTPATIENT_CLINIC_OR_DEPARTMENT_OTHER): Payer: Commercial Managed Care - HMO | Admitting: Oncology

## 2016-10-05 ENCOUNTER — Inpatient Hospital Stay: Payer: Commercial Managed Care - HMO

## 2016-10-05 VITALS — BP 135/87 | HR 76 | Temp 98.9°F | Resp 20 | Wt 163.0 lb

## 2016-10-05 DIAGNOSIS — D649 Anemia, unspecified: Secondary | ICD-10-CM

## 2016-10-05 DIAGNOSIS — G629 Polyneuropathy, unspecified: Secondary | ICD-10-CM | POA: Diagnosis not present

## 2016-10-05 DIAGNOSIS — R938 Abnormal findings on diagnostic imaging of other specified body structures: Secondary | ICD-10-CM | POA: Diagnosis not present

## 2016-10-05 DIAGNOSIS — Z79899 Other long term (current) drug therapy: Secondary | ICD-10-CM | POA: Diagnosis not present

## 2016-10-05 DIAGNOSIS — C50411 Malignant neoplasm of upper-outer quadrant of right female breast: Secondary | ICD-10-CM

## 2016-10-05 DIAGNOSIS — F419 Anxiety disorder, unspecified: Secondary | ICD-10-CM

## 2016-10-05 DIAGNOSIS — Z170 Estrogen receptor positive status [ER+]: Secondary | ICD-10-CM

## 2016-10-05 DIAGNOSIS — Z809 Family history of malignant neoplasm, unspecified: Secondary | ICD-10-CM

## 2016-10-05 DIAGNOSIS — Z803 Family history of malignant neoplasm of breast: Secondary | ICD-10-CM

## 2016-10-05 LAB — COMPREHENSIVE METABOLIC PANEL
ALK PHOS: 83 U/L (ref 38–126)
ALT: 24 U/L (ref 14–54)
ANION GAP: 6 (ref 5–15)
AST: 29 U/L (ref 15–41)
Albumin: 4 g/dL (ref 3.5–5.0)
BILIRUBIN TOTAL: 0.3 mg/dL (ref 0.3–1.2)
BUN: 13 mg/dL (ref 6–20)
CALCIUM: 9.4 mg/dL (ref 8.9–10.3)
CO2: 25 mmol/L (ref 22–32)
CREATININE: 0.7 mg/dL (ref 0.44–1.00)
Chloride: 108 mmol/L (ref 101–111)
Glucose, Bld: 91 mg/dL (ref 65–99)
Potassium: 3.9 mmol/L (ref 3.5–5.1)
SODIUM: 139 mmol/L (ref 135–145)
TOTAL PROTEIN: 6.7 g/dL (ref 6.5–8.1)

## 2016-10-05 LAB — CBC WITH DIFFERENTIAL/PLATELET
Basophils Absolute: 0.1 10*3/uL (ref 0–0.1)
Basophils Relative: 2 %
EOS ABS: 0.2 10*3/uL (ref 0–0.7)
Eosinophils Relative: 5 %
HEMATOCRIT: 33.2 % — AB (ref 35.0–47.0)
HEMOGLOBIN: 11.6 g/dL — AB (ref 12.0–16.0)
LYMPHS ABS: 0.9 10*3/uL — AB (ref 1.0–3.6)
LYMPHS PCT: 19 %
MCH: 31.9 pg (ref 26.0–34.0)
MCHC: 35.1 g/dL (ref 32.0–36.0)
MCV: 91.1 fL (ref 80.0–100.0)
MONOS PCT: 13 %
Monocytes Absolute: 0.6 10*3/uL (ref 0.2–0.9)
NEUTROS PCT: 61 %
Neutro Abs: 2.9 10*3/uL (ref 1.4–6.5)
Platelets: 318 10*3/uL (ref 150–440)
RBC: 3.65 MIL/uL — ABNORMAL LOW (ref 3.80–5.20)
RDW: 13.2 % (ref 11.5–14.5)
WBC: 4.7 10*3/uL (ref 3.6–11.0)

## 2016-10-05 MED ORDER — PACLITAXEL CHEMO INJECTION 300 MG/50ML
72.0000 mg/m2 | Freq: Once | INTRAVENOUS | Status: AC
  Administered 2016-10-05: 132 mg via INTRAVENOUS
  Filled 2016-10-05: qty 22

## 2016-10-05 MED ORDER — SODIUM CHLORIDE 0.9 % IV SOLN: 10.0000 mg | Freq: Once | INTRAVENOUS | Status: DC

## 2016-10-05 MED ORDER — DIPHENHYDRAMINE HCL 50 MG/ML IJ SOLN
25.0000 mg | Freq: Once | INTRAMUSCULAR | Status: AC
  Administered 2016-10-05: 25 mg via INTRAVENOUS
  Filled 2016-10-05: qty 1

## 2016-10-05 MED ORDER — HEPARIN SOD (PORK) LOCK FLUSH 100 UNIT/ML IV SOLN
500.0000 [IU] | Freq: Once | INTRAVENOUS | Status: AC | PRN
  Administered 2016-10-05: 500 [IU]
  Filled 2016-10-05: qty 5

## 2016-10-05 MED ORDER — SODIUM CHLORIDE 0.9 % IV SOLN
Freq: Once | INTRAVENOUS | Status: AC
  Administered 2016-10-05: 11:00:00 via INTRAVENOUS
  Filled 2016-10-05: qty 1000

## 2016-10-05 MED ORDER — FAMOTIDINE IN NACL 20-0.9 MG/50ML-% IV SOLN
20.0000 mg | Freq: Once | INTRAVENOUS | Status: AC
Start: 2016-10-05 — End: 2016-10-05
  Administered 2016-10-05: 20 mg via INTRAVENOUS
  Filled 2016-10-05: qty 50

## 2016-10-05 MED ORDER — DEXAMETHASONE SODIUM PHOSPHATE 10 MG/ML IJ SOLN
10.0000 mg | Freq: Once | INTRAMUSCULAR | Status: AC
  Administered 2016-10-05: 10 mg via INTRAVENOUS
  Filled 2016-10-05: qty 1

## 2016-10-05 NOTE — Progress Notes (Signed)
Patient reports neuropathy in hands has improved, reports neuropathy in feet.

## 2016-10-12 ENCOUNTER — Inpatient Hospital Stay: Payer: Commercial Managed Care - HMO

## 2016-10-12 VITALS — BP 133/75 | HR 74 | Temp 97.2°F | Resp 18

## 2016-10-12 DIAGNOSIS — C50411 Malignant neoplasm of upper-outer quadrant of right female breast: Secondary | ICD-10-CM | POA: Diagnosis not present

## 2016-10-12 LAB — COMPREHENSIVE METABOLIC PANEL WITH GFR
ALT: 28 U/L (ref 14–54)
AST: 30 U/L (ref 15–41)
Albumin: 4 g/dL (ref 3.5–5.0)
Alkaline Phosphatase: 75 U/L (ref 38–126)
Anion gap: 3 — ABNORMAL LOW (ref 5–15)
BUN: 14 mg/dL (ref 6–20)
CO2: 26 mmol/L (ref 22–32)
Calcium: 9 mg/dL (ref 8.9–10.3)
Chloride: 108 mmol/L (ref 101–111)
Creatinine, Ser: 0.68 mg/dL (ref 0.44–1.00)
GFR calc Af Amer: >60 mL/min
GFR calc non Af Amer: >60 mL/min
Glucose, Bld: 99 mg/dL (ref 65–99)
Potassium: 3.9 mmol/L (ref 3.5–5.1)
Sodium: 137 mmol/L (ref 135–145)
Total Bilirubin: 0.4 mg/dL (ref 0.3–1.2)
Total Protein: 6.9 g/dL (ref 6.5–8.1)

## 2016-10-12 LAB — CBC WITH DIFFERENTIAL/PLATELET
Basophils Absolute: 0.1 10*3/uL (ref 0–0.1)
Basophils Relative: 2 %
Eosinophils Absolute: 0.2 10*3/uL (ref 0–0.7)
Eosinophils Relative: 5 %
HCT: 34.1 % — ABNORMAL LOW (ref 35.0–47.0)
Hemoglobin: 11.8 g/dL — ABNORMAL LOW (ref 12.0–16.0)
Lymphocytes Relative: 22 %
Lymphs Abs: 1 10*3/uL (ref 1.0–3.6)
MCH: 31.1 pg (ref 26.0–34.0)
MCHC: 34.5 g/dL (ref 32.0–36.0)
MCV: 90.2 fL (ref 80.0–100.0)
Monocytes Absolute: 0.4 10*3/uL (ref 0.2–0.9)
Monocytes Relative: 9 %
Neutro Abs: 2.9 10*3/uL (ref 1.4–6.5)
Neutrophils Relative %: 62 %
Platelets: 298 10*3/uL (ref 150–440)
RBC: 3.78 MIL/uL — ABNORMAL LOW (ref 3.80–5.20)
RDW: 13 % (ref 11.5–14.5)
WBC: 4.6 10*3/uL (ref 3.6–11.0)

## 2016-10-12 MED ORDER — PACLITAXEL CHEMO INJECTION 300 MG/50ML
72.0000 mg/m2 | Freq: Once | INTRAVENOUS | Status: AC
  Administered 2016-10-12: 132 mg via INTRAVENOUS
  Filled 2016-10-12: qty 22

## 2016-10-12 MED ORDER — SODIUM CHLORIDE 0.9% FLUSH
10.0000 mL | INTRAVENOUS | Status: DC | PRN
  Administered 2016-10-12: 10 mL via INTRAVENOUS
  Filled 2016-10-12: qty 10

## 2016-10-12 MED ORDER — HEPARIN SOD (PORK) LOCK FLUSH 100 UNIT/ML IV SOLN: 500.0000 [IU] | Freq: Once | INTRAVENOUS | Status: DC | PRN

## 2016-10-12 MED ORDER — SODIUM CHLORIDE 0.9 % IV SOLN: 10.0000 mg | Freq: Once | INTRAVENOUS | Status: DC

## 2016-10-12 MED ORDER — FAMOTIDINE IN NACL 20-0.9 MG/50ML-% IV SOLN
20.0000 mg | Freq: Once | INTRAVENOUS | Status: AC
  Administered 2016-10-12: 20 mg via INTRAVENOUS
  Filled 2016-10-12: qty 50

## 2016-10-12 MED ORDER — DEXAMETHASONE SODIUM PHOSPHATE 10 MG/ML IJ SOLN
10.0000 mg | Freq: Once | INTRAMUSCULAR | Status: AC
  Administered 2016-10-12: 10 mg via INTRAVENOUS
  Filled 2016-10-12: qty 1

## 2016-10-12 MED ORDER — HEPARIN SOD (PORK) LOCK FLUSH 100 UNIT/ML IV SOLN
500.0000 [IU] | Freq: Once | INTRAVENOUS | Status: AC
  Administered 2016-10-12: 500 [IU] via INTRAVENOUS
  Filled 2016-10-12: qty 5

## 2016-10-12 MED ORDER — SODIUM CHLORIDE 0.9 % IV SOLN
Freq: Once | INTRAVENOUS | Status: AC
  Administered 2016-10-12: 11:00:00 via INTRAVENOUS
  Filled 2016-10-12: qty 1000

## 2016-10-12 MED ORDER — DIPHENHYDRAMINE HCL 50 MG/ML IJ SOLN
25.0000 mg | Freq: Once | INTRAMUSCULAR | Status: AC
  Administered 2016-10-12: 25 mg via INTRAVENOUS
  Filled 2016-10-12: qty 1

## 2016-10-31 ENCOUNTER — Other Ambulatory Visit: Payer: Self-pay | Admitting: Surgery

## 2016-10-31 DIAGNOSIS — C50411 Malignant neoplasm of upper-outer quadrant of right female breast: Secondary | ICD-10-CM

## 2016-10-31 DIAGNOSIS — Z170 Estrogen receptor positive status [ER+]: Secondary | ICD-10-CM

## 2016-11-06 ENCOUNTER — Other Ambulatory Visit: Payer: Self-pay | Admitting: Surgery

## 2016-11-06 DIAGNOSIS — C50411 Malignant neoplasm of upper-outer quadrant of right female breast: Secondary | ICD-10-CM

## 2016-11-15 ENCOUNTER — Ambulatory Visit: Payer: Commercial Managed Care - HMO | Admitting: Oncology

## 2016-11-20 ENCOUNTER — Ambulatory Visit
Admission: RE | Admit: 2016-11-20 | Discharge: 2016-11-20 | Disposition: A | Discharge status: Home / Self Care | Payer: 59 | Source: Ambulatory Visit | Attending: Surgery | Admitting: Surgery

## 2016-11-20 ENCOUNTER — Ambulatory Visit
Admission: RE | Admit: 2016-11-20 | Discharge: 2016-11-20 | Disposition: A | Discharge status: Home / Self Care | Payer: 59 | Source: Ambulatory Visit | Attending: Obstetrics and Gynecology | Admitting: Obstetrics and Gynecology

## 2016-11-20 DIAGNOSIS — C50411 Malignant neoplasm of upper-outer quadrant of right female breast: Secondary | ICD-10-CM | POA: Insufficient documentation

## 2016-11-20 DIAGNOSIS — N83291 Other ovarian cyst, right side: Secondary | ICD-10-CM | POA: Diagnosis not present

## 2016-11-20 DIAGNOSIS — Z170 Estrogen receptor positive status [ER+]: Secondary | ICD-10-CM

## 2016-11-20 DIAGNOSIS — R9389 Abnormal findings on diagnostic imaging of other specified body structures: Secondary | ICD-10-CM

## 2016-11-20 DIAGNOSIS — R938 Abnormal findings on diagnostic imaging of other specified body structures: Secondary | ICD-10-CM | POA: Insufficient documentation

## 2016-11-20 HISTORY — DX: Personal history of antineoplastic chemotherapy: Z92.21

## 2016-11-21 ENCOUNTER — Other Ambulatory Visit: Payer: Self-pay

## 2016-11-21 ENCOUNTER — Inpatient Hospital Stay: Admission: RE | Admit: 2016-11-21 | Payer: Commercial Managed Care - HMO | Source: Ambulatory Visit

## 2016-11-21 ENCOUNTER — Telehealth: Payer: Self-pay

## 2016-11-21 MED ORDER — ZOLPIDEM TARTRATE 10 MG PO TABS: 5.0000 mg | ORAL_TABLET | Freq: Every day | ORAL | 0 refills | Status: DC

## 2016-11-21 NOTE — Telephone Encounter (Signed)
Printed Rx.

## 2016-11-21 NOTE — Telephone Encounter (Signed)
That's fine

## 2016-11-21 NOTE — Telephone Encounter (Signed)
Patient is needing a refill on her Ambien. She is requesting 90 tabs.

## 2016-11-23 ENCOUNTER — Inpatient Hospital Stay: Payer: 59 | Attending: Oncology

## 2016-11-23 ENCOUNTER — Encounter
Admission: RE | Admit: 2016-11-23 | Discharge: 2016-11-23 | Disposition: A | Discharge status: Home / Self Care | Payer: 59 | Source: Ambulatory Visit | Attending: Surgery | Admitting: Surgery

## 2016-11-23 ENCOUNTER — Other Ambulatory Visit: Payer: Self-pay | Admitting: Oncology

## 2016-11-23 DIAGNOSIS — Z170 Estrogen receptor positive status [ER+]: Secondary | ICD-10-CM | POA: Insufficient documentation

## 2016-11-23 DIAGNOSIS — D649 Anemia, unspecified: Secondary | ICD-10-CM | POA: Insufficient documentation

## 2016-11-23 DIAGNOSIS — Z452 Encounter for adjustment and management of vascular access device: Secondary | ICD-10-CM | POA: Diagnosis not present

## 2016-11-23 DIAGNOSIS — G629 Polyneuropathy, unspecified: Secondary | ICD-10-CM | POA: Insufficient documentation

## 2016-11-23 DIAGNOSIS — N83201 Unspecified ovarian cyst, right side: Secondary | ICD-10-CM | POA: Diagnosis not present

## 2016-11-23 DIAGNOSIS — Z79899 Other long term (current) drug therapy: Secondary | ICD-10-CM | POA: Diagnosis not present

## 2016-11-23 DIAGNOSIS — R938 Abnormal findings on diagnostic imaging of other specified body structures: Secondary | ICD-10-CM | POA: Insufficient documentation

## 2016-11-23 DIAGNOSIS — Z95828 Presence of other vascular implants and grafts: Secondary | ICD-10-CM

## 2016-11-23 DIAGNOSIS — F419 Anxiety disorder, unspecified: Secondary | ICD-10-CM | POA: Insufficient documentation

## 2016-11-23 DIAGNOSIS — Z809 Family history of malignant neoplasm, unspecified: Secondary | ICD-10-CM | POA: Insufficient documentation

## 2016-11-23 DIAGNOSIS — C50411 Malignant neoplasm of upper-outer quadrant of right female breast: Secondary | ICD-10-CM | POA: Diagnosis present

## 2016-11-23 MED ORDER — LIDOCAINE-PRILOCAINE 2.5-2.5 % EX CREA: 1.0000 "application " | TOPICAL_CREAM | CUTANEOUS | 1 refills | Status: DC | PRN

## 2016-11-23 MED ORDER — HEPARIN SOD (PORK) LOCK FLUSH 100 UNIT/ML IV SOLN
500.0000 [IU] | Freq: Once | INTRAVENOUS | Status: AC
  Administered 2016-11-23: 500 [IU] via INTRAVENOUS

## 2016-11-23 MED ORDER — SODIUM CHLORIDE 0.9% FLUSH
10.0000 mL | INTRAVENOUS | Status: DC | PRN
  Administered 2016-11-23: 10 mL via INTRAVENOUS
  Filled 2016-11-23: qty 10

## 2016-11-28 NOTE — Patient Instructions (Signed)
  Your procedure is scheduled on: 11-30-16 THURSDAY Report to [place] AT 7:45 AM   Remember: Instructions that are not followed completely may result in serious medical risk, up to and including death, or upon the discretion of your surgeon and anesthesiologist your surgery may need to be rescheduled.    _x___ 1. Do not eat food or drink liquids after midnight. No gum chewing or hard candies.     __x__ 2. No Alcohol for 24 hours before or after surgery.   __x__3. No Smoking for 24 prior to surgery.   ____  4. Bring all medications with you on the day of surgery if instructed.    __x__ 5. Notify your doctor if there is any change in your medical condition     (cold, fever, infections).     Do not wear jewelry, make-up, hairpins, clips or nail polish.  Do not wear lotions, powders, or perfumes. You may wear deodorant.  Do not shave 48 hours prior to surgery. Men may shave face and neck.  Do not bring valuables to the hospital.    [facility] is not responsible for any belongings or valuables.               Contacts, dentures or bridgework may not be worn into surgery.  Leave your suitcase in the car. After surgery it may be brought to your room.  For patients admitted to the hospital, discharge time is determined by your  treatment team.   Patients discharged the day of surgery will not be allowed to drive home.  You will need someone to drive you home and stay with you the night of your procedure.    Please read over the following fact sheets that you were given:    _x___ [place] WITH A SMALL SIP OF WATER. These include:  1. XANAX  2.  3.  4.  5.  6.  ____Fleets enema or Magnesium Citrate as directed.   ____ Use CHG Soap or sage wipes as directed on instruction sheet   ____ Use inhalers on the day of surgery and bring to hospital day of surgery  ____ Stop Metformin and Janumet 2 days prior to surgery.    ____  Take 1/2 of usual insulin dose the night before surgery and none on the morning surgery.   ____ Follow recommendations from Cardiologist, Pulmonologist or PCP regarding stopping Aspirin, Coumadin, Pllavix ,Eliquis, Effient, or Pradaxa, and Pletal.  X____Stop Anti-inflammatories such as Advil, Aleve, Ibuprofen, Motrin, Naproxen, Naprosyn, Goodies powders or aspirin products NOW- OK to take Tylenol    _x___ Stop supplements until after surgery-STOP BIOTIN NOW-MAY RESUME AFTER SURGERY   ____ Bring C-Pap to the hospital.

## 2016-11-29 ENCOUNTER — Inpatient Hospital Stay: Payer: 59

## 2016-11-30 ENCOUNTER — Ambulatory Visit: Payer: 59 | Admitting: Certified Registered"

## 2016-11-30 ENCOUNTER — Ambulatory Visit
Admission: RE | Admit: 2016-11-30 | Discharge: 2016-11-30 | Disposition: A | Discharge status: Home / Self Care | Payer: 59 | Source: Ambulatory Visit | Attending: Surgery | Admitting: Surgery

## 2016-11-30 ENCOUNTER — Telehealth: Payer: Self-pay

## 2016-11-30 ENCOUNTER — Encounter
Admission: RE | Disposition: A | Discharge status: Home / Self Care | Payer: Self-pay | Source: Ambulatory Visit | Attending: Surgery

## 2016-11-30 ENCOUNTER — Encounter: Payer: Self-pay | Admitting: *Deleted

## 2016-11-30 DIAGNOSIS — Z79899 Other long term (current) drug therapy: Secondary | ICD-10-CM | POA: Insufficient documentation

## 2016-11-30 DIAGNOSIS — Z808 Family history of malignant neoplasm of other organs or systems: Secondary | ICD-10-CM | POA: Insufficient documentation

## 2016-11-30 DIAGNOSIS — C50411 Malignant neoplasm of upper-outer quadrant of right female breast: Secondary | ICD-10-CM

## 2016-11-30 DIAGNOSIS — Z170 Estrogen receptor positive status [ER+]: Secondary | ICD-10-CM | POA: Diagnosis not present

## 2016-11-30 DIAGNOSIS — F419 Anxiety disorder, unspecified: Secondary | ICD-10-CM | POA: Diagnosis not present

## 2016-11-30 DIAGNOSIS — Z880 Allergy status to penicillin: Secondary | ICD-10-CM | POA: Diagnosis not present

## 2016-11-30 DIAGNOSIS — C773 Secondary and unspecified malignant neoplasm of axilla and upper limb lymph nodes: Secondary | ICD-10-CM | POA: Diagnosis not present

## 2016-11-30 DIAGNOSIS — Z9221 Personal history of antineoplastic chemotherapy: Secondary | ICD-10-CM | POA: Insufficient documentation

## 2016-11-30 DIAGNOSIS — Z803 Family history of malignant neoplasm of breast: Secondary | ICD-10-CM | POA: Diagnosis not present

## 2016-11-30 HISTORY — PX: SENTINEL NODE BIOPSY: SHX6608

## 2016-11-30 HISTORY — PX: PARTIAL MASTECTOMY WITH NEEDLE LOCALIZATION: SHX6008

## 2016-11-30 HISTORY — PX: BREAST LUMPECTOMY: SHX2

## 2016-11-30 SURGERY — PARTIAL MASTECTOMY WITH NEEDLE LOCALIZATION
Anesthesia: General | Site: Breast | Laterality: Right | Wound class: Clean

## 2016-11-30 MED ORDER — LIDOCAINE HCL (PF) 2 % IJ SOLN
INTRAMUSCULAR | Status: AC
  Filled 2016-11-30: qty 2

## 2016-11-30 MED ORDER — SODIUM CHLORIDE 0.9 % IV SOLN
INTRAVENOUS | Status: DC | PRN
  Administered 2016-11-30: 50 ug/min via INTRAVENOUS

## 2016-11-30 MED ORDER — MIDAZOLAM HCL 2 MG/2ML IJ SOLN
INTRAMUSCULAR | Status: AC
  Filled 2016-11-30: qty 2

## 2016-11-30 MED ORDER — FENTANYL CITRATE (PF) 100 MCG/2ML IJ SOLN
INTRAMUSCULAR | Status: AC
  Filled 2016-11-30: qty 2

## 2016-11-30 MED ORDER — KETOROLAC TROMETHAMINE 30 MG/ML IJ SOLN
INTRAMUSCULAR | Status: DC | PRN
  Administered 2016-11-30: 30 mg via INTRAVENOUS

## 2016-11-30 MED ORDER — PROPOFOL 10 MG/ML IV BOLUS
INTRAVENOUS | Status: DC | PRN
  Administered 2016-11-30: 150 mg via INTRAVENOUS
  Administered 2016-11-30: 50 mg via INTRAVENOUS

## 2016-11-30 MED ORDER — FENTANYL CITRATE (PF) 100 MCG/2ML IJ SOLN
25.0000 ug | INTRAMUSCULAR | Status: DC | PRN
  Administered 2016-11-30 (×2): 25 ug via INTRAVENOUS

## 2016-11-30 MED ORDER — PROPOFOL 500 MG/50ML IV EMUL
INTRAVENOUS | Status: AC
  Filled 2016-11-30: qty 50

## 2016-11-30 MED ORDER — DEXAMETHASONE SODIUM PHOSPHATE 10 MG/ML IJ SOLN
INTRAMUSCULAR | Status: DC | PRN
  Administered 2016-11-30: 4 mg via INTRAVENOUS

## 2016-11-30 MED ORDER — ONDANSETRON HCL 4 MG/2ML IJ SOLN
INTRAMUSCULAR | Status: AC
  Filled 2016-11-30: qty 2

## 2016-11-30 MED ORDER — METHYLENE BLUE 0.5 % INJ SOLN
INTRAVENOUS | Status: AC
  Filled 2016-11-30: qty 10

## 2016-11-30 MED ORDER — HYDROCODONE-ACETAMINOPHEN 5-325 MG PO TABS
ORAL_TABLET | ORAL | Status: AC
  Filled 2016-11-30: qty 1

## 2016-11-30 MED ORDER — PROPOFOL 500 MG/50ML IV EMUL
INTRAVENOUS | Status: DC | PRN
  Administered 2016-11-30: 125 ug/kg/min via INTRAVENOUS

## 2016-11-30 MED ORDER — FENTANYL CITRATE (PF) 100 MCG/2ML IJ SOLN
INTRAMUSCULAR | Status: DC | PRN
  Administered 2016-11-30 (×3): 50 ug via INTRAVENOUS
  Administered 2016-11-30 (×2): 25 ug via INTRAVENOUS

## 2016-11-30 MED ORDER — FAMOTIDINE 20 MG PO TABS
20.0000 mg | ORAL_TABLET | Freq: Once | ORAL | Status: AC
  Administered 2016-11-30: 20 mg via ORAL

## 2016-11-30 MED ORDER — ONDANSETRON HCL 4 MG/2ML IJ SOLN
INTRAMUSCULAR | Status: DC | PRN
  Administered 2016-11-30: 4 mg via INTRAVENOUS

## 2016-11-30 MED ORDER — BUPIVACAINE-EPINEPHRINE (PF) 0.5% -1:200000 IJ SOLN
INTRAMUSCULAR | Status: AC
  Filled 2016-11-30: qty 30

## 2016-11-30 MED ORDER — PROPOFOL 10 MG/ML IV BOLUS
INTRAVENOUS | Status: AC
  Filled 2016-11-30: qty 20

## 2016-11-30 MED ORDER — TECHNETIUM TC 99M SULFUR COLLOID FILTERED
0.8000 | Freq: Once | INTRAVENOUS | Status: AC | PRN
  Administered 2016-11-30: 0.8 via INTRADERMAL

## 2016-11-30 MED ORDER — DEXAMETHASONE SODIUM PHOSPHATE 10 MG/ML IJ SOLN
INTRAMUSCULAR | Status: AC
  Filled 2016-11-30: qty 1

## 2016-11-30 MED ORDER — BUPIVACAINE-EPINEPHRINE 0.5% -1:200000 IJ SOLN
INTRAMUSCULAR | Status: DC | PRN
  Administered 2016-11-30: 15 mL

## 2016-11-30 MED ORDER — LACTATED RINGERS IV SOLN
INTRAVENOUS | Status: DC
  Administered 2016-11-30: 50 mL/h via INTRAVENOUS

## 2016-11-30 MED ORDER — PROMETHAZINE HCL 25 MG/ML IJ SOLN: 6.2500 mg | INTRAMUSCULAR | Status: DC | PRN

## 2016-11-30 MED ORDER — MIDAZOLAM HCL 2 MG/2ML IJ SOLN
INTRAMUSCULAR | Status: DC | PRN
  Administered 2016-11-30: 2 mg via INTRAVENOUS

## 2016-11-30 MED ORDER — HYDROCODONE-ACETAMINOPHEN 5-325 MG PO TABS
1.0000 | ORAL_TABLET | ORAL | Status: DC | PRN
  Administered 2016-11-30: 1 via ORAL

## 2016-11-30 MED ORDER — FAMOTIDINE 20 MG PO TABS
ORAL_TABLET | ORAL | Status: AC
  Administered 2016-11-30: 20 mg via ORAL
  Filled 2016-11-30: qty 1

## 2016-11-30 MED ORDER — LIDOCAINE HCL (CARDIAC) 20 MG/ML IV SOLN
INTRAVENOUS | Status: DC | PRN
  Administered 2016-11-30: 60 mg via INTRAVENOUS

## 2016-11-30 MED ORDER — HYDROCODONE-ACETAMINOPHEN 5-325 MG PO TABS: 1.0000 | ORAL_TABLET | ORAL | 0 refills | Status: DC | PRN

## 2016-11-30 SURGICAL SUPPLY — 37 items
ADH SKN CLS APL DERMABOND .7 (GAUZE/BANDAGES/DRESSINGS) ×1
BLADE SURG 15 STRL LF DISP TIS (BLADE) ×1 IMPLANT
BLADE SURG 15 STRL SS (BLADE) ×3
CANISTER SUCT 1200ML W/VALVE (MISCELLANEOUS) ×3 IMPLANT
CHLORAPREP W/TINT 26ML (MISCELLANEOUS) ×3 IMPLANT
CNTNR SPEC 2.5X3XGRAD LEK (MISCELLANEOUS) ×2
CONT SPEC 4OZ STER OR WHT (MISCELLANEOUS) ×4
CONT SPEC 4OZ STRL OR WHT (MISCELLANEOUS) ×2
CONTAINER SPEC 2.5X3XGRAD LEK (MISCELLANEOUS) ×2 IMPLANT
COVER LIGHT HANDLE STERIS (MISCELLANEOUS) ×4 IMPLANT
DERMABOND ADVANCED (GAUZE/BANDAGES/DRESSINGS) ×2
DERMABOND ADVANCED .7 DNX12 (GAUZE/BANDAGES/DRESSINGS) ×1 IMPLANT
DEVICE DUBIN SPECIMEN MAMMOGRA (MISCELLANEOUS) ×5 IMPLANT
DRAPE LAPAROTOMY 77X122 PED (DRAPES) ×3 IMPLANT
ELECT REM PT RETURN 9FT ADLT (ELECTROSURGICAL) ×3
ELECTRODE REM PT RTRN 9FT ADLT (ELECTROSURGICAL) ×1 IMPLANT
GLOVE BIO SURGEON STRL SZ7.5 (GLOVE) ×3 IMPLANT
GOWN STRL REUS W/ TWL LRG LVL3 (GOWN DISPOSABLE) ×2 IMPLANT
GOWN STRL REUS W/TWL LRG LVL3 (GOWN DISPOSABLE) ×6
KIT RM TURNOVER STRD PROC AR (KITS) ×3 IMPLANT
LABEL OR SOLS (LABEL) ×3 IMPLANT
MARGIN MAP 10MM (MISCELLANEOUS) ×3 IMPLANT
NDL HYPO 25X1 1.5 SAFETY (NEEDLE) ×1 IMPLANT
NDL SAFETY 18GX1.5 (NEEDLE) ×1 IMPLANT
NDL SAFETY 22GX1.5 (NEEDLE) IMPLANT
NEEDLE HYPO 25X1 1.5 SAFETY (NEEDLE) ×3 IMPLANT
PACK BASIN MINOR ARMC (MISCELLANEOUS) ×3 IMPLANT
SLEVE PROBE SENORX GAMMA FIND (MISCELLANEOUS) ×3 IMPLANT
SUT CHROMIC 3 0 SH 27 (SUTURE) IMPLANT
SUT CHROMIC 4 0 RB 1X27 (SUTURE) ×5 IMPLANT
SUT ETHILON 3-0 FS-10 30 BLK (SUTURE)
SUT MNCRL 4-0 (SUTURE) ×3
SUT MNCRL 4-0 27XMFL (SUTURE) ×1
SUTURE EHLN 3-0 FS-10 30 BLK (SUTURE) ×1 IMPLANT
SUTURE MNCRL 4-0 27XMF (SUTURE) ×1 IMPLANT
SYRINGE 10CC LL (SYRINGE) ×1 IMPLANT
WATER STERILE IRR 1000ML POUR (IV SOLUTION) ×3 IMPLANT

## 2016-11-30 NOTE — OR Nursing (Signed)
[place] Dr. Tamala Julian here.  OK'd dc home.

## 2016-11-30 NOTE — Anesthesia Procedure Notes (Signed)
Procedure Name: LMA Insertion Performed by: Richel Millspaugh Pre-anesthesia Checklist: Patient identified, Patient being monitored, Timeout performed, Emergency Drugs available and Suction available Patient Re-evaluated:Patient Re-evaluated prior to induction Oxygen Delivery Method: Circle system utilized Preoxygenation: Pre-oxygenation with 100% oxygen Induction Type: IV induction Ventilation: Mask ventilation without difficulty LMA: LMA inserted LMA Size: 3.5 Tube type: Oral Number of attempts: 1 Placement Confirmation: positive ETCO2 and breath sounds checked- equal and bilateral Tube secured with: Tape Dental Injury: Teeth and Oropharynx as per pre-operative assessment        

## 2016-11-30 NOTE — H&P (Signed)
Kimberly Clarke is an 61 y.o. female.   Chief Complaint: Right breast cancer HPI: She has history of a palpable mass in the upper outer quadrant of her right breast which she discovered in November 2017. She had mammograms on 04/21/2016. The radiologist reported a new spiculated mass containing suspicious calcifications within the upper outer quadrant of the right breast. Physical exam demonstrated a firm palpable mass 10 o'clock position right breast 4 cm from the nipple. Ultrasound demonstrated 1.3 x 1.7 x 1.7 irregular hypoechoic mass 10 o'clock position of the right breast 4 cm from the nipple containing microcalcifications. A right axillary lymph node with focal cortical thickening measuring up to 4.5 mm. I have reviewed these mammogram and ultrasound images. She had ultrasound-guided core needle biopsy of the right breast mass with insertion of a coil-shaped clip. She also at the same time had ultrasound-guided core needle biopsy of the right axillary lymph node with insertion of a marker. Pathology demonstrated invasive mammary carcinoma no special type measuring 10 mm. ER positive PR positive HER-2/neu 2+. There was also metastatic carcinoma found in the lymph node biopsy. She did have surgery consultation in December 2017. She had a Port-A-Cath inserted on the left side. She later has had neoadjuvant chemotherapy with Adriamycin and Cytoxan and most recently Taxol under the direction of Dr. Grayland Ormond.  Since her office visit she has had a right mammogram depicting the location of the mass in the upper outer quadrant. Ultrasound demonstrated the mass was slightly smaller for chemotherapy.  She has had preoperative x-ray needle localization with insertion of Kopan's wire. I have reviewed these mammogram images demonstrating the proximity of the wire to the tumor. The biopsy marker is about 8 mm away from the tumor. The radiologist reported that the ultrasound demonstrated no enlarged lymph nodes. It did  not appear to be possible to localize a lymph node with Kopan's wire. She did have the preoperative injection of radioactive technetium sulfur colloid.  She reports no change in overall condition since the day of the office exam.      Past Medical History:  Diagnosis Date  . Anxiety   . Cancer ([place]) 04/24/2016   right breast.  chemo finished 09/2016  . HSV infection    HISTORY OF HSV  . Personal history of chemotherapy   . PONV (postoperative nausea and vomiting)    nausea no vomiting  . Post-menopausal 10/20/2005  . Postmenopause bleeding   . Thickened endometrium 05/30/2016    Past Surgical History:  Procedure Laterality Date  . ANAL FISTULECTOMY    . ENDOMETRIAL BIOPSY    . KNEE SURGERY     snow skating  . PORTACATH PLACEMENT N/A 05/23/2016   Procedure: INSERTION PORT-A-CATH;  Surgeon: Leonie Green, MD;  Location: [facility] ORS;  Service: General;  Laterality: N/A;    Family History  Problem Relation Age of Onset  . Cancer Mother        BREAST AND BONES  . Breast cancer Mother 72  . Emphysema Father   . Cancer Father   . Cancer Maternal Aunt    Social History:  reports that she has never smoked. She has never used smokeless tobacco. She reports that she drinks about 3.6 - 4.8 oz of alcohol per week . She reports that she does not use drugs.  Allergies:  Allergies  Allergen Reactions  . Penicillins Anaphylaxis    unknown    Medications Prior to Admission  Medication Sig Dispense Refill  . ALPRAZolam (  XANAX) 0.25 MG tablet Take 0.125 mg by mouth 2 (two) times daily as needed for anxiety.     Marland Kitchen BIOTIN PO Take 1 tablet by mouth daily.     . Calcium Carb-Cholecalciferol (CALCIUM + D3 PO) Take 1 tablet by mouth daily.    . citalopram (CELEXA) 20 MG tablet Take 20 mg by mouth at bedtime.     . docusate sodium (COLACE) 100 MG capsule Take 200 mg by mouth at bedtime.    . Multiple Vitamins-Minerals (MULTIVITAMIN ADULTS 50+) TABS Take 1 tablet by mouth daily.    .  ondansetron (ZOFRAN) 8 MG tablet Take 1 tablet (8 mg total) by mouth 2 (two) times daily as needed. Start on the third day after chemotherapy. (Patient not taking: Reported on 11/23/2016) 30 tablet 1  . polycarbophil (FIBERCON) 625 MG tablet Take 1,250 mg by mouth at bedtime.     . prochlorperazine (COMPAZINE) 10 MG tablet Take 1 tablet (10 mg total) by mouth every 6 (six) hours as needed (Nausea or vomiting). (Patient not taking: Reported on 11/23/2016) 30 tablet 1  . zolpidem (AMBIEN) 10 MG tablet Take 0.5 tablets (5 mg total) by mouth at bedtime. 90 tablet 0  . lidocaine-prilocaine (EMLA) cream Apply 1 application topically as needed. 30 g 1  She only occasionally takes her Xanax and Celexa.  No results found for this or any previous visit (from the past 48 hour(s)). Nm Sentinel Node Injection  Result Date: 11/30/2016 CLINICAL DATA:  Right-sided breast cancer. EXAM: NUCLEAR MEDICINE BREAST LYMPHOSCINTIGRAPHY TECHNIQUE: Intradermal injection of radiopharmaceutical was performed at the 12 o'clock, 3 o'clock, 6 o'clock, and 9 o'clock positions around the right nipple. The patient was then sent to the operating room where the sentinel node(s) were identified and removed by the surgeon. RADIOPHARMACEUTICALS:  Total of 1 mCi Millipore-filtered Technetium-76msulfur colloid, injected in four aliquots of 0.25 mCi each. IMPRESSION: Uncomplicated intradermal injection of a total of 1 mCi Technetium-931mulfur colloid for purposes of sentinel node identification. Electronically Signed   By: JoSandi Mariscal.D.   On: 11/30/2016 09:56   Mm Rt Plc Breast Loc Dev   1st Lesion  Inc Mammo Guide  Result Date: 11/30/2016 CLINICAL DATA:  [age]ear old female for wire localization of a right breast carcinoma EXAM: NEEDLE LOCALIZATION OF THE RIGHT BREAST WITH MAMMO GUIDANCE COMPARISON:  Previous exams. FINDINGS: Patient presents for needle localization prior to right lumpectomy. I met with the patient and we discussed the  procedure of needle localization including benefits and alternatives. We discussed the high likelihood of a successful procedure. We discussed the risks of the procedure, including infection, bleeding, tissue injury, and further surgery. Informed, written consent was given. The usual time-out protocol was performed immediately prior to the procedure. Using mammographic guidance, sterile technique, 1% lidocaine and a 7 cm modified Kopans needle, the residual cancer within the upper, outer right breast was localized using a superior to inferior approach. The images were marked for Dr. SmTamala JulianIMPRESSION: Needle localization of the right breast. No apparent complications. Electronically Signed   By: ErPamelia Hoit.D.   On: 11/30/2016 08:42    ROS she reports no recent acute illness such as cough cold or sore throat. She is having no difficulty breathing, no chest pain. No abdominal pain. She has been eating satisfactorily and moving her bowels satisfactorily and voiding satisfactorily. She reports no ankle edema. Review of systems otherwise negative.  Recent lab work were reviewed  Blood pressure 113/78, pulse 65, temperature (!)  97.5 F (36.4 C), temperature source Oral, resp. rate 14, height 5' 8.75" (1.746 m), weight 159 lb (72.1 kg), SpO2 100 %. Physical Exam GEN.: She is awake alert and oriented and oriented no acute distress.   SKIN: Warm and dry without rash.  HEENT:  Head is normocephalic.  Pupils are equal reactive to light.  Extraocular movements are intact. Sclera is clear.  Pharynx is clear.  NECK:  Supple with no palpable mass and no adenopathy.  LUNGS:  Clear without rales rhonchi or wheezes.  HEART:  Regular rhythm S1-S2, without murmur.  RIGHT BREAST: There is a dressing overlying the right breast and yes mark identified.  ABDOMEN: Soft flat and nontender.  EXTREMITIES:  Well-developed well-nourished with no dependent edema.  NEUROLOGIC:  Awake alert and moving all  extremities. Assessment/Plan Cancer of the upper outer quadrant of the right breast  I discussed the plan for right partial mastectomy with axillary sentinel lymph node biopsy  Rochel Brome, MD 11/30/2016, 11:38 AM

## 2016-11-30 NOTE — Transfer of Care (Signed)
Immediate Anesthesia Transfer of Care Note  Patient: Kimberly Clarke  Procedure(s) Performed: Procedure(s): PARTIAL MASTECTOMY WITH NEEDLE LOCALIZATION (Right) SENTINEL NODE BIOPSY (Right)  Patient Location: PACU  Anesthesia Type:General  Level of Consciousness: sedated and responds to stimulation  Airway & Oxygen Therapy: Patient Spontanous Breathing and Patient connected to face mask oxygen  Post-op Assessment: Report given to RN and Post -op Vital signs reviewed and stable  Post vital signs: Reviewed and stable  Last Vitals:  Vitals:   11/30/16 1415 11/30/16 1416  BP: 110/73 110/73  Pulse: 91 87  Resp: (!) 9 (!) 21  Temp: (!) 36.3 C     Last Pain:  Vitals:   11/30/16 0939  TempSrc: Oral      Patients Stated Pain Goal: 0 (18/86/77 3736)  Complications: No apparent anesthesia complications

## 2016-11-30 NOTE — Anesthesia Post-op Follow-up Note (Cosign Needed)
Anesthesia QCDR form completed.        

## 2016-11-30 NOTE — Anesthesia Preprocedure Evaluation (Signed)
Anesthesia Evaluation  Patient identified by MRN, date of birth, ID band Patient awake    Reviewed: Allergy & Precautions, H&P , NPO status , Patient's Chart, lab work & pertinent test results  History of Anesthesia Complications (+) PONV and history of anesthetic complications  Airway Mallampati: III  TM Distance: >3 FB Neck ROM: full    Dental  (+) Poor Dentition, Chipped   Pulmonary neg pulmonary ROS, neg shortness of breath,           Cardiovascular Exercise Tolerance: Good (-) angina(-) Past MI and (-) DOE negative cardio ROS       Neuro/Psych PSYCHIATRIC DISORDERS Anxiety negative neurological ROS     GI/Hepatic negative GI ROS, Neg liver ROS, neg GERD  ,  Endo/Other  negative endocrine ROS  Renal/GU negative Renal ROS  negative genitourinary   Musculoskeletal   Abdominal   Peds  Hematology negative hematology ROS (+)   Anesthesia Other Findings Past Medical History: No date: Anxiety 04/24/2016: Cancer ([place])     Comment: right breast No date: HSV infection     Comment: HISTORY OF HSV No date: PONV (postoperative nausea and vomiting)     Comment: nausea no vomiting 10/20/2005: Post-menopausal No date: Postmenopause bleeding  Past Surgical History: No date: ANAL FISTULECTOMY No date: ENDOMETRIAL BIOPSY No date: KNEE SURGERY     Comment: snow skating     Reproductive/Obstetrics negative OB ROS                             Anesthesia Physical  Anesthesia Plan  ASA: III  Anesthesia Plan: General   Post-op Pain Management:    Induction: Intravenous  PONV Risk Score and Plan: 3 and Ondansetron, Dexamethasone, Propofol and Midazolam  Airway Management Planned: LMA  Additional Equipment:   Intra-op Plan:   Post-operative Plan: Extubation in OR  Informed Consent: I have reviewed the patients History and Physical, chart, labs and discussed the procedure including  the risks, benefits and alternatives for the proposed anesthesia with the patient or authorized representative who has indicated his/her understanding and acceptance.   Dental Advisory Given  Plan Discussed with: Anesthesiologist, CRNA and Surgeon  Anesthesia Plan Comments:         Anesthesia Quick Evaluation

## 2016-11-30 NOTE — Discharge Instructions (Addendum)
Take Tylenol or Norco if needed for pain. ° °Should not drive or do anything dangerous when taking Norco. ° °May shower and blot dry. ° °Wear bra as desired for comfort and support. ° °AMBULATORY SURGERY  °DISCHARGE INSTRUCTIONS ° ° °1) The drugs that you were given will stay in your system until tomorrow so for the next 24 hours you should not: ° °A) Drive an automobile °B) Make any legal decisions °C) Drink any alcoholic beverage ° ° °2) You may resume regular meals tomorrow.  Today it is better to start with liquids and gradually work up to solid foods. ° °You may eat anything you prefer, but it is better to start with liquids, then soup and crackers, and gradually work up to solid foods. ° ° °3) Please notify your doctor immediately if you have any unusual bleeding, trouble breathing, redness and pain at the surgery site, drainage, fever, or pain not relieved by medication. ° ° ° °4) Additional Instructions: ° ° ° ° ° ° ° °Please contact your physician with any problems or Same Day Surgery at [phone], Monday through Friday 6 am to 4 pm, or [facility] Main number at [phone]. °

## 2016-11-30 NOTE — Op Note (Signed)
OPERATIVE REPORT  PREOPERATIVE  DIAGNOSIS: . Right breast cancer  POSTOPERATIVE DIAGNOSIS: . Right breast cancer  PROCEDURE: . Right partial mastectomy, sentinel lymph node biopsy  ANESTHESIA:  General  SURGEON: Rochel Brome  MD   INDICATIONS: . She has history of biopsy findings of cancer of the upper outer quadrant of the right breast. She has had neoadjuvant chemotherapy. She now comes for definitive surgical treatment. She did have preoperative insertion of Kopan's wire. Mammograms reviewed prior to surgery. She also had injection of radioactive technetium sulfur colloid.  With the patient on the operating table in the supine position she was placed under general anesthesia. The right arm was placed on a lateral arm support. The dressing was removed from the right breast exposing the Kopan's wire in the upper outer quadrant. The wire was cut 2 cm from the skin. The breast chest wall and upper arm were prepared with ChloraPrep and draped in a sterile manner.  A curvilinear incision was made from 9:00 to 11:00 position of the right breast approximately 4.5 cm from the nipple. A narrow ellipse of skin was excised with the underlying specimen. Electrocautery was used for hemostasis. Dissection was carried down through subcutaneous tissues. Next the wire was encountered in the upper aspect of the operative field. There was some firmness surrounding the wire. The firm mass was dissected free from surrounding structures using electrocautery for hemostasis. As the mass was being excised sutures were used to attach markers to label the medial lateral cranial caudal and deep margins. The fragment of skin remained in contact with the specimen. This was submitted for specimen mammogram and pathology. The wound was inspected and several small bleeding points were cauterized. Hemostasis was subsequently intact  Attention was turned to the axilla which was probed with a gamma counter demonstrating the  location of radioactivity in the inferior aspect of the axilla. An oblique incision was made some 4 cm in length carried down through subcutaneous tissues deeply within the axilla encountering a focal area of radioactivity. There was a lymph node which was approximately 7 mm in dimension slightly firm was dissected free from surrounding tissues and removed with some surrounding fatty tissue. The ex vivo count was in the range of 300-350 counts per second. There was another adjacent smaller lymph node within the specimen. With a lesser degree of counts per second in the range of 30-50. The lymph nodes were submitted for pathology. 3 clamped bleeding points were suture ligated with 3-0 chromic. The wound was inspected and appeared that hemostasis was intact  The radiologist called and said that the tumor appeared to be close to the medial margin. The pathologist called to report that there appeared to be some abnormality of the medial margin could possibly be tumor or could be fibrosis. I therefore elected to re-excise the medial margin removing a portion of tissue which was approximately 3 x 3 cm by approximately 8 mm in thickness the new margin was placed on Telfa in the specimen was sutured to the Telfa. Also stickers were applied to the Telfa to indicate the cranial and caudal margins of this reexcision with the suture at the anterior margin. The pathologist later called to report that there was no was the apparent tumor in the specimen.  The subcuticular tissues for both wounds were infiltrated with half percent Sensorcaine with epinephrine. Also tissues surrounding cautery artifact of the partial mastectomy wound were infiltrated.  The axillary wound was closed with running 4-0 Monocryl subcutaneous suture. The  subcutaneous tissues of the partial mastectomy wound were approximated with interrupted 3-0 chromic. The skin was closed with running 4-0 Monocryl. Both wounds were treated with Dermabond and  allowed to dry  The patient tolerated surgery satisfactorily and was then prepared for transfer to the recovery room  [facility].D.

## 2016-11-30 NOTE — Telephone Encounter (Signed)
  Oncology Nurse Navigator Documentation Called and went over U/S results with sister, Lelon Frohlich per Ms. Needham's request. She has rescheduled appointment for 7/25 with Dr. Fransisca Connors. Awaiting his response regarding U/S. Navigator Location: [facility] (11/30/16 1600)   )Navigator Encounter Type: Telephone (11/30/16 1600)                                                    Time Spent with Patient: 15 (11/30/16 1600)

## 2016-12-01 ENCOUNTER — Encounter: Payer: Self-pay | Admitting: Surgery

## 2016-12-01 LAB — SURGICAL PATHOLOGY

## 2016-12-01 NOTE — Anesthesia Postprocedure Evaluation (Signed)
Anesthesia Post Note  Patient: Kimberly Clarke  Procedure(s) Performed: Procedure(s) (LRB): PARTIAL MASTECTOMY WITH NEEDLE LOCALIZATION (Right) SENTINEL NODE BIOPSY (Right)  Patient location during evaluation: PACU Anesthesia Type: General Level of consciousness: awake and alert Pain management: pain level controlled Vital Signs Assessment: post-procedure vital signs reviewed and stable Respiratory status: spontaneous breathing, nonlabored ventilation, respiratory function stable and patient connected to nasal cannula oxygen Cardiovascular status: blood pressure returned to baseline and stable Postop Assessment: no signs of nausea or vomiting Anesthetic complications: no     Last Vitals:  Vitals:   11/30/16 1524 11/30/16 1741  BP: 124/80 122/84  Pulse: 64 63  Resp: 16 16  Temp:      Last Pain:  Vitals:   12/01/16 0843  TempSrc:   PainSc: 0-No pain                 Martha Clan

## 2016-12-04 ENCOUNTER — Telehealth: Payer: Self-pay

## 2016-12-04 NOTE — Telephone Encounter (Signed)
  Oncology Nurse Navigator Documentation Notified Ms. Kimberly Clarke that per Dr. Fransisca Connors her U/S was reassuring assuming she does not have any new bleeding. She denies any bleeding. She was instrucuted to keep appointment on 7/25 with Dr. Fransisca Connors. Navigator Location: [facility] (12/04/16 1500)   )Navigator Encounter Type: Telephone (12/04/16 1500)                                                    Time Spent with Patient: 15 (12/04/16 1500)

## 2016-12-11 ENCOUNTER — Encounter: Payer: Self-pay | Admitting: Surgery

## 2016-12-12 NOTE — Progress Notes (Signed)
[place]  [phone]) [phone] Fax:(336) [phone]  ID: Kimberly Clarke OB: Feb 10, 1956  MR#: [id number]  XHB#:[id number]  [name], MD as PCP - General (Family Medicine) Clent Jacks, RN as Registered Nurse  CHIEF COMPLAINT: Pathologic stage IIa ER/PR positive, HER-2 negative invasive carcinoma of the upper outer quadrant of the right breast.  INTERVAL HISTORY: Patient returns to clinic today for further evaluation and discussion of her final pathology results. She tolerated her lumpectomy well without significant side effects. She does not complain of peripheral neuropathy today. She currently feels well and is asymptomatic. She continues to be highly anxious. She has no other neurologic complaints. She denies any recent fevers or illnesses. She has a good appetite and denies weight loss. She has no chest pain or shortness of breath. She denies any nausea, vomiting, constipation, or diarrhea. She has no urinary complaints. Patient offers no further specific complaints today.   REVIEW OF SYSTEMS:   Review of Systems  Constitutional: Negative for fever, malaise/fatigue and weight loss.  HENT: Negative for congestion and sinus pain.   Respiratory: Negative.  Negative for cough and shortness of breath.   Cardiovascular: Negative for chest pain and leg swelling.  Gastrointestinal: Negative for abdominal pain, constipation, diarrhea, nausea and vomiting.  Genitourinary: Negative.  Negative for frequency and urgency.  Musculoskeletal: Negative.   Neurological: Negative for dizziness, tingling, sensory change, weakness and headaches.  Psychiatric/Behavioral: Negative for depression. The patient is nervous/anxious. The patient does not have insomnia.     As per HPI. Otherwise, a complete review of systems is negative.  PAST MEDICAL HISTORY: Past Medical History:  Diagnosis Date  . Anxiety   . Cancer ([place]) 04/24/2016   right breast.   chemo finished 09/2016  . HSV infection    HISTORY OF HSV  . Personal history of chemotherapy   . PONV (postoperative nausea and vomiting)    nausea no vomiting  . Post-menopausal 10/20/2005  . Postmenopause bleeding   . Thickened endometrium 05/30/2016    PAST SURGICAL HISTORY: Past Surgical History:  Procedure Laterality Date  . ANAL FISTULECTOMY    . ENDOMETRIAL BIOPSY    . KNEE SURGERY     snow skating  . PARTIAL MASTECTOMY WITH NEEDLE LOCALIZATION Right 11/30/2016   Procedure: PARTIAL MASTECTOMY WITH NEEDLE LOCALIZATION;  Surgeon: Leonie Green, MD;  Location: [facility] ORS;  Service: General;  Laterality: Right;  . PORTACATH PLACEMENT N/A 05/23/2016   Procedure: INSERTION PORT-A-CATH;  Surgeon: Leonie Green, MD;  Location: [facility] OR[facility];  Service: General;  Laterality: N/A;  . SENTINEL NODE BIOPSY Right 11/30/2016   Procedure: SENTINEL NODE BIOPSY;  Surgeon: Leonie Green, MD;  Location: [facility] ORS;  Service: General;  Laterality: Right;    FAMILY HISTORY: Family History  Problem Relation Age of Onset  . Cancer Mother        BREAST AND BONES  . Breast cancer Mother 39  . Emphysema Father   . Cancer Father   . Cancer Maternal Aunt     ADVANCED DIRECTIVES (Y/N):  N  HEALTH MAINTENANCE: Social History  Substance Use Topics  . Smoking status: Never Smoker  . Smokeless tobacco: Never Used  . Alcohol use 3.6 - 4.8 oz/week    3 - 4 Glasses of wine, 3 - 4 Cans of beer per week     Comment: WEEKENDS OCC     Colonoscopy:  PAP:  Bone density:  Lipid panel:  Allergies  Allergen Reactions  .  Penicillins Anaphylaxis    unknown    Current Outpatient Prescriptions  Medication Sig Dispense Refill  . ALPRAZolam (XANAX) 0.25 MG tablet Take 0.125 mg by mouth 2 (two) times daily as needed for anxiety.     Marland Kitchen BIOTIN PO Take 1 tablet by mouth daily.     . Calcium Carb-Cholecalciferol (CALCIUM + D3 PO) Take 1 tablet by mouth daily.    . citalopram (CELEXA) 20 MG  tablet Take 20 mg by mouth at bedtime.     . docusate sodium (COLACE) 100 MG capsule Take 200 mg by mouth at bedtime.    . lidocaine-prilocaine (EMLA) cream Apply 1 application topically as needed. 30 g 1  . Multiple Vitamins-Minerals (MULTIVITAMIN ADULTS 50+) TABS Take 1 tablet by mouth daily.    . polycarbophil (FIBERCON) 625 MG tablet Take 1,250 mg by mouth at bedtime.     Marland Kitchen zolpidem (AMBIEN) 10 MG tablet Take 0.5 tablets (5 mg total) by mouth at bedtime. 90 tablet 0  . HYDROcodone-acetaminophen (NORCO) 5-325 MG tablet Take 1-2 tablets by mouth every 4 (four) hours as needed for moderate pain. (Patient not taking: Reported on 12/13/2016) 12 tablet 0   No current facility-administered medications for this visit.     OBJECTIVE: Vitals:   12/13/16 1040  BP: 132/81  Pulse: 79  Resp: 18  Temp: 97.8 F (36.6 C)     Body mass index is 24.01 kg/m.    ECOG FS:0 - Asymptomatic  General: Well-developed, well-nourished, no acute distress. Eyes: Pink conjunctiva, anicteric sclera. Breasts: Right breast with minimally palpable disease. No palpable axillary lymphadenopathy. Lungs: Clear to auscultation bilaterally. Heart: Regular rate and rhythm. No rubs, murmurs, or gallops. Abdomen: Soft, nontender, nondistended. No organomegaly noted, normoactive bowel sounds. Musculoskeletal: No edema, cyanosis, or clubbing. Neuro: Alert, answering all questions appropriately. Cranial nerves grossly intact. Skin: No rashes or petechiae noted. Psych: Normal affect.   LAB RESULTS:  Lab Results  Component Value Date   NA 137 10/12/2016   K 3.9 10/12/2016   CL 108 10/12/2016   CO2 26 10/12/2016   GLUCOSE 99 10/12/2016   BUN 14 10/12/2016   CREATININE 0.68 10/12/2016   CALCIUM 9.0 10/12/2016   PROT 6.9 10/12/2016   ALBUMIN 4.0 10/12/2016   AST 30 10/12/2016   ALT 28 10/12/2016   ALKPHOS 75 10/12/2016   BILITOT 0.4 10/12/2016   GFRNONAA >60 10/12/2016   GFRAA >60 10/12/2016    Lab Results    Component Value Date   WBC 4.6 10/12/2016   NEUTROABS 2.9 10/12/2016   HGB 11.8 (L) 10/12/2016   HCT 34.1 (L) 10/12/2016   MCV 90.2 10/12/2016   PLT 298 10/12/2016     STUDIES: US Transvaginal Non-ob  Result Date: 11/20/2016 CLINICAL DATA:  Follow-up thickened endometrium found on the study of May 19, 2016. History of breast malignancy. The patient is postmenopausal EXAM: TRANSABDOMINAL AND TRANSVAGINAL ULTRASOUND OF PELVIS TECHNIQUE: Both transabdominal and transvaginal ultrasound examinations of the pelvis were performed. Transabdominal technique was performed for global imaging of the pelvis including uterus, ovaries, adnexal regions, and pelvic cul-de-sac. It was necessary to proceed with endovaginal exam following the transabdominal exam to visualize the endometrium and ovaries. COMPARISON:  Pelvic ultrasound dated May 19, 2016 FINDINGS: Uterus Measurements: 5.9 x 2.1 x 3.2 cm. No fibroids or other mass visualized. Endometrium Thickness: The endometrium remains thickened at 12.8 mm. The endometrial echotexture is heterogeneous and hypervascular. Right ovary Measurements: 2.5 x 1.9 x 2.0 cm. There is a right  ovarian cyst measuring 2.3 x 1.8 x 2.0 cm. Left ovary Measurements: 2.4 x 1.4 x 1.2 cm. Normal appearance/no adnexal mass. Other findings No abnormal free fluid. IMPRESSION: Persistently thickened endometrium demonstrating hypervascularity. Endometrial sampling is recommended to exclude malignancy. Simple appearing right ovarian cyst measuring 2.3 cm in greatest dimension is stable. Electronically Signed   By: David  Martinique M.D.   On: 11/20/2016 15:36   US Pelvis Complete  Result Date: 11/20/2016 CLINICAL DATA:  Follow-up thickened endometrium found on the study of May 19, 2016. History of breast malignancy. The patient is postmenopausal EXAM: TRANSABDOMINAL AND TRANSVAGINAL ULTRASOUND OF PELVIS TECHNIQUE: Both transabdominal and transvaginal ultrasound examinations of the  pelvis were performed. Transabdominal technique was performed for global imaging of the pelvis including uterus, ovaries, adnexal regions, and pelvic cul-de-sac. It was necessary to proceed with endovaginal exam following the transabdominal exam to visualize the endometrium and ovaries. COMPARISON:  Pelvic ultrasound dated May 19, 2016 FINDINGS: Uterus Measurements: 5.9 x 2.1 x 3.2 cm. No fibroids or other mass visualized. Endometrium Thickness: The endometrium remains thickened at 12.8 mm. The endometrial echotexture is heterogeneous and hypervascular. Right ovary Measurements: 2.5 x 1.9 x 2.0 cm. There is a right ovarian cyst measuring 2.3 x 1.8 x 2.0 cm. Left ovary Measurements: 2.4 x 1.4 x 1.2 cm. Normal appearance/no adnexal mass. Other findings No abnormal free fluid. IMPRESSION: Persistently thickened endometrium demonstrating hypervascularity. Endometrial sampling is recommended to exclude malignancy. Simple appearing right ovarian cyst measuring 2.3 cm in greatest dimension is stable. Electronically Signed   By: David  Martinique M.D.   On: 11/20/2016 15:36   Nm Sentinel Node Injection  Result Date: 11/30/2016 CLINICAL DATA:  Right-sided breast cancer. EXAM: NUCLEAR MEDICINE BREAST LYMPHOSCINTIGRAPHY TECHNIQUE: Intradermal injection of radiopharmaceutical was performed at the 12 o'clock, 3 o'clock, 6 o'clock, and 9 o'clock positions around the right nipple. The patient was then sent to the operating room where the sentinel node(s) were identified and removed by the surgeon. RADIOPHARMACEUTICALS:  Total of 1 mCi Millipore-filtered Technetium-27msulfur colloid, injected in four aliquots of 0.25 mCi each. IMPRESSION: Uncomplicated intradermal injection of a total of 1 mCi Technetium-983mulfur colloid for purposes of sentinel node identification. Electronically Signed   By: JoSandi Mariscal.D.   On: 11/30/2016 09:56   Mm Breast Surgical Specimen  Result Date: 11/30/2016 CLINICAL DATA:  [age]ear old  female status post right lumpectomy EXAM: SPECIMEN RADIOGRAPH OF THE RIGHT BREAST COMPARISON:  Previous exam(s). FINDINGS: Status post excision of the right breast. The wire tip and biopsy marker clip are present and are marked for pathology. The localized mass and associated calcifications appear close to the medial margin on the specimen radiograph. Findings were communicated to Dr. SmTamala Juliann the operating room via telephone by Dr. ShBrigitte Pulset 1:10 p.m. on 11/30/2016. IMPRESSION: Specimen radiograph of the right breast. Electronically Signed   By: ErPamelia Hoit.D.   On: 11/30/2016 13:12   [employer] Uni Right Inc Axilla  Result Date: 11/20/2016 CLINICAL DATA:  [age]ear old female with known right breast cancer. The patient is here to assess response to neoadjuvant therapy. EXAM: 2D DIGITAL DIAGNOSTIC RIGHT MAMMOGRAM WITH CAD AND ADJUNCT TOMO ULTRASOUND RIGHT BREAST COMPARISON:  Previous exam(s). ACR Breast Density Category c: The breast tissue is heterogeneously dense, which may obscure small masses. FINDINGS: Interval decrease in size in the previously noted irregular mass in the upper, outer right breast, middle depth. Due to its irregular nature, the mass is difficult to precisely measure ; however,  the central aspect of the mass has significantly decreased in size when compared to prior exam. A coil shaped biopsy marker is noted approximately 8 mm lateral to the lateral margin of the mass. A butterfly shaped biopsy marker projects over a right axillary lymph node. No new abnormality is identified within the right breast. Mammographic images were processed with CAD. Targeted ultrasound the right breast demonstrates an irregular hypoechoic mass at 10 o'clock, 4 cm from the nipple measuring approximately 10 x 13 x 17 mm from 17 x 13 x 17 mm previously. Precise measurement comparison is difficult due to the irregular nature of the mass. A linear echogenic focus consistent with the previously placed biopsy marker is  seen at about 8 mm lateral to the known cancer at 10 o'clock, 4 cm from nipple. Targeted ultrasound of the right axilla demonstrates no suspicious appearing axillary lymph nodes. The previously biopsied lymph node is not definitively identified. IMPRESSION: 1. Known right breast carcinoma at 10 o'clock, 4 cm from the nipple with interval response to chemotherapy, best demonstrated mammographically. The coil shaped biopsy marker is approximately 8 mm lateral to the lateral margin of the mass at 10 o'clock. 2. Targeted ultrasound of the right axilla demonstrates no suspicious appearing axillary lymph nodes. The previously biopsied lymph node is not definitively identified. The butterfly shaped biopsy marker placed at the time of lymph node biopsy is seen mammographically in the expected location in the right axilla. RECOMMENDATION: Treatment plan. I have discussed the findings and recommendations with the patient. Results were also provided in writing at the conclusion of the visit. If applicable, a reminder letter will be sent to the patient regarding the next appointment. BI-RADS CATEGORY  6: Known biopsy-proven malignancy. Electronically Signed   By: Pamelia Hoit M.D.   On: 11/20/2016 12:46   Mm Diag Breast Tomo Uni Right  Result Date: 11/20/2016 CLINICAL DATA:  61 year old female with known right breast cancer. The patient is here to assess response to neoadjuvant therapy. EXAM: 2D DIGITAL DIAGNOSTIC RIGHT MAMMOGRAM WITH CAD AND ADJUNCT TOMO ULTRASOUND RIGHT BREAST COMPARISON:  Previous exam(s). ACR Breast Density Category c: The breast tissue is heterogeneously dense, which may obscure small masses. FINDINGS: Interval decrease in size in the previously noted irregular mass in the upper, outer right breast, middle depth. Due to its irregular nature, the mass is difficult to precisely measure ; however, the central aspect of the mass has significantly decreased in size when compared to prior exam. A coil shaped  biopsy marker is noted approximately 8 mm lateral to the lateral margin of the mass. A butterfly shaped biopsy marker projects over a right axillary lymph node. No new abnormality is identified within the right breast. Mammographic images were processed with CAD. Targeted ultrasound the right breast demonstrates an irregular hypoechoic mass at 10 o'clock, 4 cm from the nipple measuring approximately 10 x 13 x 17 mm from 17 x 13 x 17 mm previously. Precise measurement comparison is difficult due to the irregular nature of the mass. A linear echogenic focus consistent with the previously placed biopsy marker is seen at about 8 mm lateral to the known cancer at 10 o'clock, 4 cm from nipple. Targeted ultrasound of the right axilla demonstrates no suspicious appearing axillary lymph nodes. The previously biopsied lymph node is not definitively identified. IMPRESSION: 1. Known right breast carcinoma at 10 o'clock, 4 cm from the nipple with interval response to chemotherapy, best demonstrated mammographically. The coil shaped biopsy marker is approximately 8 mm  lateral to the lateral margin of the mass at 10 o'clock. 2. Targeted ultrasound of the right axilla demonstrates no suspicious appearing axillary lymph nodes. The previously biopsied lymph node is not definitively identified. The butterfly shaped biopsy marker placed at the time of lymph node biopsy is seen mammographically in the expected location in the right axilla. RECOMMENDATION: Treatment plan. I have discussed the findings and recommendations with the patient. Results were also provided in writing at the conclusion of the visit. If applicable, a reminder letter will be sent to the patient regarding the next appointment. BI-RADS CATEGORY  6: Known biopsy-proven malignancy. Electronically Signed   By: Pamelia Hoit M.D.   On: 11/20/2016 12:46   Mm Rt Plc Breast Loc Dev   1st Lesion  Inc Mammo Guide  Result Date: 11/30/2016 CLINICAL DATA:  61 year old female  for wire localization of a right breast carcinoma EXAM: NEEDLE LOCALIZATION OF THE RIGHT BREAST WITH MAMMO GUIDANCE COMPARISON:  Previous exams. FINDINGS: Patient presents for needle localization prior to right lumpectomy. I met with the patient and we discussed the procedure of needle localization including benefits and alternatives. We discussed the high likelihood of a successful procedure. We discussed the risks of the procedure, including infection, bleeding, tissue injury, and further surgery. Informed, written consent was given. The usual time-out protocol was performed immediately prior to the procedure. Using mammographic guidance, sterile technique, 1% lidocaine and a 7 cm modified Kopans needle, the residual cancer within the upper, outer right breast was localized using a superior to inferior approach. The images were marked for Dr. Tamala Julian. IMPRESSION: Needle localization of the right breast. No apparent complications. Electronically Signed   By: Pamelia Hoit M.D.   On: 11/30/2016 08:42    ASSESSMENT: Pathologic stage IIa ER/PR positive, HER-2 negative invasive carcinoma of the upper outer quadrant of the right breast.  PLAN:    1. Pathologic stage IIa ER/PR positive, HER-2 negative invasive carcinoma of the upper outer quadrant of the right breast: Pathology results reviewed independently with minimal change of her tumor despite neoadjuvant chemotherapy. Patient completed her neoadjuvant chemotherapy on Oct 12, 2016. Her lumpectomy was done on November 30, 2016. Because she had a lumpectomy, patient will require adjuvant XRT which she wishes to do at [facility] where she works. A referral has been made. Patient will follow-up in 8 weeks for further evaluation and initiation of an aromatase inhibitor which she will be required to take for 5 years. 2. Endometrial thickening: Patient was previously seen by gynecology oncology. Biopsy confirms no evidence of malignancy.  3. Constipation: Resolved. Continue OTC  treatments as needed. 4. Anemia: Mild, monitor. 5. Peripheral neuropathy: Likely secondary to Taxol, resolved.   Patient expressed understanding and was in agreement with this plan. She also understands that She can call clinic at any time with any questions, concerns, or complaints.   Cancer Staging Primary cancer of upper outer quadrant of right female breast [facility]) Staging form: Breast, AJCC 7th Edition - Clinical stage from 04/26/2016: Stage IIA (T1c, N1, M0) - Signed by Lloyd Huger, MD on 05/25/2016 - Pathologic stage from 12/15/2016: Stage IIA (yT1c, N1a, cM0) - Signed by Lloyd Huger, MD on 12/15/2016    Lloyd Huger, MD 12/15/16 12:32 PM

## 2016-12-13 ENCOUNTER — Inpatient Hospital Stay (HOSPITAL_BASED_OUTPATIENT_CLINIC_OR_DEPARTMENT_OTHER): Payer: 59 | Admitting: Oncology

## 2016-12-13 ENCOUNTER — Ambulatory Visit: Payer: 59

## 2016-12-13 VITALS — BP 132/81 | HR 79 | Temp 97.8°F | Resp 18 | Wt 161.4 lb

## 2016-12-13 DIAGNOSIS — Z809 Family history of malignant neoplasm, unspecified: Secondary | ICD-10-CM | POA: Diagnosis not present

## 2016-12-13 DIAGNOSIS — F419 Anxiety disorder, unspecified: Secondary | ICD-10-CM

## 2016-12-13 DIAGNOSIS — Z170 Estrogen receptor positive status [ER+]: Secondary | ICD-10-CM | POA: Diagnosis not present

## 2016-12-13 DIAGNOSIS — Z79899 Other long term (current) drug therapy: Secondary | ICD-10-CM | POA: Diagnosis not present

## 2016-12-13 DIAGNOSIS — R938 Abnormal findings on diagnostic imaging of other specified body structures: Secondary | ICD-10-CM

## 2016-12-13 DIAGNOSIS — N83201 Unspecified ovarian cyst, right side: Secondary | ICD-10-CM | POA: Diagnosis not present

## 2016-12-13 DIAGNOSIS — D649 Anemia, unspecified: Secondary | ICD-10-CM

## 2016-12-13 DIAGNOSIS — Z452 Encounter for adjustment and management of vascular access device: Secondary | ICD-10-CM | POA: Diagnosis not present

## 2016-12-13 DIAGNOSIS — G629 Polyneuropathy, unspecified: Secondary | ICD-10-CM | POA: Diagnosis not present

## 2016-12-13 DIAGNOSIS — C50411 Malignant neoplasm of upper-outer quadrant of right female breast: Secondary | ICD-10-CM

## 2016-12-13 NOTE — Progress Notes (Signed)
Patient is here for follow up, she is doing well no complaints.  

## 2017-01-03 ENCOUNTER — Ambulatory Visit: Payer: 59

## 2017-01-04 ENCOUNTER — Inpatient Hospital Stay: Payer: 59 | Attending: Oncology

## 2017-01-04 DIAGNOSIS — C50411 Malignant neoplasm of upper-outer quadrant of right female breast: Secondary | ICD-10-CM | POA: Insufficient documentation

## 2017-01-04 DIAGNOSIS — Z452 Encounter for adjustment and management of vascular access device: Secondary | ICD-10-CM | POA: Diagnosis not present

## 2017-01-04 DIAGNOSIS — Z170 Estrogen receptor positive status [ER+]: Secondary | ICD-10-CM | POA: Diagnosis not present

## 2017-01-04 DIAGNOSIS — Z95828 Presence of other vascular implants and grafts: Secondary | ICD-10-CM

## 2017-01-04 MED ORDER — SODIUM CHLORIDE 0.9% FLUSH
10.0000 mL | INTRAVENOUS | Status: DC | PRN
  Administered 2017-01-04: 10 mL via INTRAVENOUS
  Filled 2017-01-04: qty 10

## 2017-01-04 MED ORDER — HEPARIN SOD (PORK) LOCK FLUSH 100 UNIT/ML IV SOLN
500.0000 [IU] | Freq: Once | INTRAVENOUS | Status: AC
  Administered 2017-01-04: 500 [IU] via INTRAVENOUS

## 2017-02-14 ENCOUNTER — Other Ambulatory Visit: Payer: Self-pay | Admitting: Family Medicine

## 2017-02-15 ENCOUNTER — Inpatient Hospital Stay: Payer: 59 | Attending: Oncology

## 2017-02-15 DIAGNOSIS — C50411 Malignant neoplasm of upper-outer quadrant of right female breast: Secondary | ICD-10-CM | POA: Insufficient documentation

## 2017-02-15 DIAGNOSIS — Z452 Encounter for adjustment and management of vascular access device: Secondary | ICD-10-CM | POA: Insufficient documentation

## 2017-02-15 DIAGNOSIS — Z95828 Presence of other vascular implants and grafts: Secondary | ICD-10-CM

## 2017-02-15 MED ORDER — HEPARIN SOD (PORK) LOCK FLUSH 100 UNIT/ML IV SOLN
500.0000 [IU] | INTRAVENOUS | Status: AC | PRN
  Administered 2017-02-15: 500 [IU]

## 2017-02-15 MED ORDER — SODIUM CHLORIDE 0.9% FLUSH
10.0000 mL | INTRAVENOUS | Status: AC | PRN
  Administered 2017-02-15: 10 mL
  Filled 2017-02-15: qty 10

## 2017-02-18 NOTE — Progress Notes (Signed)
[place]  [phone] [phone]  ID: Kimberly Clarke OB: Sep 21, 1955  MR#: [id number]  XHF#:[id number]  [name], MD as PCP - General (Family Medicine) Clent Jacks, RN as Registered Nurse  CHIEF COMPLAINT: Pathologic stage IIa ER/PR positive, HER-2 negative invasive carcinoma of the upper outer quadrant of the right breast.  INTERVAL HISTORY: Patient returns to clinic today for further evaluation and initiation of an aromatase inhibitor. She is tolerating her XRT well only with some mild skin erythema. She currently feels well and is asymptomatic. She continues to be highly anxious. She has no other neurologic complaints. She denies any recent fevers or illnesses. She has a good appetite and denies weight loss. She has no chest pain or shortness of breath. She denies any nausea, vomiting, constipation, or diarrhea. She has no urinary complaints. Patient offers no further specific complaints today.   REVIEW OF SYSTEMS:   Review of Systems  Constitutional: Negative for fever, malaise/fatigue and weight loss.  HENT: Negative for congestion and sinus pain.   Respiratory: Negative.  Negative for cough and shortness of breath.   Cardiovascular: Negative for chest pain and leg swelling.  Gastrointestinal: Negative for abdominal pain, constipation, diarrhea, nausea and vomiting.  Genitourinary: Negative.  Negative for frequency and urgency.  Musculoskeletal: Negative.   Neurological: Negative for dizziness, tingling, sensory change, weakness and headaches.  Psychiatric/Behavioral: Negative for depression. The patient is nervous/anxious and has insomnia.     As per HPI. Otherwise, a complete review of systems is negative.  PAST MEDICAL HISTORY: Past Medical History:  Diagnosis Date  . Anxiety   . Cancer ([place]) 04/24/2016   right breast.  chemo finished 09/2016  . HSV infection    HISTORY OF HSV  . Personal  history of chemotherapy   . PONV (postoperative nausea and vomiting)    nausea no vomiting  . Post-menopausal 10/20/2005  . Postmenopause bleeding   . Thickened endometrium 05/30/2016    PAST SURGICAL HISTORY: Past Surgical History:  Procedure Laterality Date  . ANAL FISTULECTOMY    . ENDOMETRIAL BIOPSY    . KNEE SURGERY     snow skating  . PARTIAL MASTECTOMY WITH NEEDLE LOCALIZATION Right 11/30/2016   Procedure: PARTIAL MASTECTOMY WITH NEEDLE LOCALIZATION;  Surgeon: Leonie Green, MD;  Location: [facility] ORS;  Service: General;  Laterality: Right;  . PORTACATH PLACEMENT N/A 05/23/2016   Procedure: INSERTION PORT-A-CATH;  Surgeon: Leonie Green, MD;  Location: [facility] ORS;  Service: General;  Laterality: N/A;  . SENTINEL NODE BIOPSY Right 11/30/2016   Procedure: SENTINEL NODE BIOPSY;  Surgeon: Leonie Green, MD;  Location: [facility] ORS;  Service: General;  Laterality: Right;    FAMILY HISTORY: Family History  Problem Relation Age of Onset  . Cancer Mother        BREAST AND BONES  . Breast cancer Mother 51  . Emphysema Father   . Cancer Father   . Cancer Maternal Aunt     ADVANCED DIRECTIVES (Y/N):  N  HEALTH MAINTENANCE: Social History  Substance Use Topics  . Smoking status: Never Smoker  . Smokeless tobacco: Never Used  . Alcohol use 3.6 - 4.8 oz/week    3 - 4 Glasses of wine, 3 - 4 Cans of beer per week     Comment: WEEKENDS OCC     Colonoscopy:  PAP:  Bone density:  Lipid panel:  Allergies  Allergen Reactions  . Penicillins Anaphylaxis    unknown  Current Outpatient Prescriptions  Medication Sig Dispense Refill  . ALPRAZolam (XANAX) 0.25 MG tablet Take 0.125 mg by mouth 2 (two) times daily as needed for anxiety.     Marland Kitchen BIOTIN PO Take 1 tablet by mouth daily.     . Calcium Carb-Cholecalciferol (CALCIUM + D3 PO) Take 1 tablet by mouth daily.    . calcium citrate-vitamin D (CITRACAL+D) 315-200 MG-UNIT tablet Take by mouth.    . citalopram (CELEXA)  20 MG tablet Take 20 mg by mouth at bedtime.     . docusate sodium (COLACE) 100 MG capsule Take 200 mg by mouth at bedtime.    Marland Kitchen HYDROcodone-acetaminophen (NORCO) 5-325 MG tablet Take 1-2 tablets by mouth every 4 (four) hours as needed for moderate pain. 12 tablet 0  . lidocaine-prilocaine (EMLA) cream Apply 1 application topically as needed. 30 g 1  . Multiple Vitamins-Minerals (MULTIVITAMIN ADULTS 50+) TABS Take 1 tablet by mouth daily.    . polycarbophil (FIBERCON) 625 MG tablet Take 1,250 mg by mouth at bedtime.     Marland Kitchen zolpidem (AMBIEN) 10 MG tablet Take 0.5 tablets (5 mg total) by mouth at bedtime. 90 tablet 0  . letrozole (FEMARA) 2.5 MG tablet Take 1 tablet (2.5 mg total) by mouth daily. 30 tablet 6   No current facility-administered medications for this visit.     OBJECTIVE: Vitals:   02/20/17 1453  BP: 126/80  Pulse: 75  Temp: (!) 97.5 F (36.4 C)     Body mass index is 23.68 kg/m.    ECOG FS:0 - Asymptomatic  General: Well-developed, well-nourished, no acute distress. Eyes: Pink conjunctiva, anicteric sclera. Breasts: Right breast mild erythema. Lungs: Clear to auscultation bilaterally. Heart: Regular rate and rhythm. No rubs, murmurs, or gallops. Abdomen: Soft, nontender, nondistended. No organomegaly noted, normoactive bowel sounds. Musculoskeletal: No edema, cyanosis, or clubbing. Neuro: Alert, answering all questions appropriately. Cranial nerves grossly intact. Skin: No rashes or petechiae noted. Psych: Normal affect.   LAB RESULTS:  Lab Results  Component Value Date   NA 137 10/12/2016   K 3.9 10/12/2016   CL 108 10/12/2016   CO2 26 10/12/2016   GLUCOSE 99 10/12/2016   BUN 14 10/12/2016   CREATININE 0.68 10/12/2016   CALCIUM 9.0 10/12/2016   PROT 6.9 10/12/2016   ALBUMIN 4.0 10/12/2016   AST 30 10/12/2016   ALT 28 10/12/2016   ALKPHOS 75 10/12/2016   BILITOT 0.4 10/12/2016   GFRNONAA >60 10/12/2016   GFRAA >60 10/12/2016    Lab Results    Component Value Date   WBC 4.6 10/12/2016   NEUTROABS 2.9 10/12/2016   HGB 11.8 (L) 10/12/2016   HCT 34.1 (L) 10/12/2016   MCV 90.2 10/12/2016   PLT 298 10/12/2016     STUDIES: No results found.  ASSESSMENT: Pathologic stage IIa ER/PR positive, HER-2 negative invasive carcinoma of the upper outer quadrant of the right breast.  PLAN:    1. Pathologic stage IIa ER/PR positive, HER-2 negative invasive carcinoma of the upper outer quadrant of the right breast: Pathology results reviewed independently with minimal change of her tumor despite neoadjuvant chemotherapy. Patient completed her neoadjuvant chemotherapy on Oct 12, 2016. Her lumpectomy was done on November 30, 2016. Patient is getting XRT at [facility] and reports she has 10 treatments left. She was given a prescription for letrozole today and instructed to initiate at the conclusion of her XRT. She will take this for 5 years completing in October 2023. We will get a baseline bone mineral  density in the next 1-2 weeks. Return to clinic in 3 months for further evaluation.  2. Endometrial thickening: Patient was previously seen by gynecology oncology. Biopsy confirms no evidence of malignancy.  3. Constipation: Resolved. Continue OTC treatments as needed. 4. Anemia: Mild, monitor. 5. Peripheral neuropathy: Likely secondary to Taxol, resolved.  6. Insomnia: Continue Ambien as needed.  Patient expressed understanding and was in agreement with this plan. She also understands that She can call clinic at any time with any questions, concerns, or complaints.   Cancer Staging Malignant neoplasm of upper-outer quadrant of female breast [facility]) Staging form: Breast, AJCC 7th Edition - Clinical stage from 04/26/2016: Stage IIA (T1c, N1, M0) - Signed by Lloyd Huger, MD on 05/25/2016 - Pathologic stage from 12/15/2016: Stage IIA (yT1c, N1a, cM0) - Signed by Lloyd Huger, MD on 12/15/2016    Lloyd Huger, MD 02/21/17 8:22 AM

## 2017-02-20 ENCOUNTER — Inpatient Hospital Stay: Payer: 59 | Attending: Oncology | Admitting: Oncology

## 2017-02-20 ENCOUNTER — Encounter: Payer: Self-pay | Admitting: Oncology

## 2017-02-20 VITALS — BP 126/80 | HR 75 | Temp 97.5°F | Wt 159.2 lb

## 2017-02-20 DIAGNOSIS — Z79899 Other long term (current) drug therapy: Secondary | ICD-10-CM | POA: Insufficient documentation

## 2017-02-20 DIAGNOSIS — D649 Anemia, unspecified: Secondary | ICD-10-CM | POA: Diagnosis not present

## 2017-02-20 DIAGNOSIS — Z780 Asymptomatic menopausal state: Secondary | ICD-10-CM | POA: Diagnosis not present

## 2017-02-20 DIAGNOSIS — F419 Anxiety disorder, unspecified: Secondary | ICD-10-CM | POA: Diagnosis not present

## 2017-02-20 DIAGNOSIS — G4700 Insomnia, unspecified: Secondary | ICD-10-CM | POA: Diagnosis not present

## 2017-02-20 DIAGNOSIS — C50411 Malignant neoplasm of upper-outer quadrant of right female breast: Secondary | ICD-10-CM | POA: Insufficient documentation

## 2017-02-20 DIAGNOSIS — B009 Herpesviral infection, unspecified: Secondary | ICD-10-CM

## 2017-02-20 DIAGNOSIS — C773 Secondary and unspecified malignant neoplasm of axilla and upper limb lymph nodes: Secondary | ICD-10-CM | POA: Diagnosis not present

## 2017-02-20 DIAGNOSIS — R9389 Abnormal findings on diagnostic imaging of other specified body structures: Secondary | ICD-10-CM | POA: Diagnosis present

## 2017-02-20 DIAGNOSIS — Z79811 Long term (current) use of aromatase inhibitors: Secondary | ICD-10-CM | POA: Diagnosis not present

## 2017-02-20 MED ORDER — LETROZOLE 2.5 MG PO TABS: 2.5000 mg | ORAL_TABLET | Freq: Every day | ORAL | 6 refills | Status: DC

## 2017-02-20 MED ORDER — ZOLPIDEM TARTRATE 10 MG PO TABS: 5.0000 mg | ORAL_TABLET | Freq: Every day | ORAL | 0 refills | Status: DC

## 2017-02-21 ENCOUNTER — Other Ambulatory Visit: Payer: Self-pay | Admitting: *Deleted

## 2017-02-21 ENCOUNTER — Inpatient Hospital Stay (HOSPITAL_BASED_OUTPATIENT_CLINIC_OR_DEPARTMENT_OTHER): Payer: 59 | Admitting: Obstetrics and Gynecology

## 2017-02-21 VITALS — BP 116/81 | HR 78 | Temp 96.6°F | Resp 18 | Wt 160.4 lb

## 2017-02-21 DIAGNOSIS — C50411 Malignant neoplasm of upper-outer quadrant of right female breast: Secondary | ICD-10-CM

## 2017-02-21 DIAGNOSIS — B009 Herpesviral infection, unspecified: Secondary | ICD-10-CM

## 2017-02-21 DIAGNOSIS — Z780 Asymptomatic menopausal state: Secondary | ICD-10-CM

## 2017-02-21 DIAGNOSIS — F419 Anxiety disorder, unspecified: Secondary | ICD-10-CM | POA: Diagnosis not present

## 2017-02-21 DIAGNOSIS — Z79899 Other long term (current) drug therapy: Secondary | ICD-10-CM | POA: Diagnosis not present

## 2017-02-21 DIAGNOSIS — R9389 Abnormal findings on diagnostic imaging of other specified body structures: Secondary | ICD-10-CM | POA: Diagnosis not present

## 2017-02-21 DIAGNOSIS — G4700 Insomnia, unspecified: Secondary | ICD-10-CM

## 2017-02-21 DIAGNOSIS — C773 Secondary and unspecified malignant neoplasm of axilla and upper limb lymph nodes: Secondary | ICD-10-CM

## 2017-02-21 DIAGNOSIS — Z79811 Long term (current) use of aromatase inhibitors: Secondary | ICD-10-CM

## 2017-02-21 MED ORDER — ANASTROZOLE 1 MG PO TABS: 1.0000 mg | ORAL_TABLET | Freq: Every day | ORAL | 3 refills | Status: DC

## 2017-02-21 NOTE — Progress Notes (Signed)
  Oncology Nurse Navigator Documentation Chaperoned pelvic exam. Will send for radiology review at [facility]. Navigator Location: [facility] (02/21/17 1400)   )Navigator Encounter Type: Follow-up Appt (02/21/17 1400)                     Patient Visit Type: GynOnc (02/21/17 1400)                              Time Spent with Patient: 15 (02/21/17 1400)

## 2017-02-21 NOTE — Progress Notes (Signed)
Gynecologic Oncology Consult Visit   Referring Provider: Dr Grayland Ormond  Chief Concern: Thickened endometrium detected on staging CT scan for breast cancer.   Subjective:  Kimberly Clarke is a 61 y.o. G41 female who is seen in consultation for thickened endometrium. Diagnosed with right breast cancer and staging CT 12/18 showed thickened endometrium up to 1.4 cm and no other abnormalities except the right breast cancer.   Returns today for follow up. Had breast cancer surgery with positive axillary nodes and will complete external radiation in 10 days.  Then will start aromatase inhibitor therapy with Dr. Grayland Ormond. Had repeat pelvic US 3 months ago that was stable with thickened 12.8 mm endometrium.The endometrial echotexture is heterogeneous and hypervascular.  Pelvic US 11/20/16 IMPRESSION: Persistently thickened endometrium demonstrating hypervascularity. Endometrial sampling is recommended to exclude malignancy.  Simple appearing right ovarian cyst measuring 2.3 cm in greatest dimension is stable.  Oncology history Pelvic US 12/19 IMPRESSION: 1. Thickened heterogeneous endometrium with cystic spaces.Thickness: 13 mm.  Heterogeneous with cystic spaces and vascularity. Endometrial thickness is considered abnormal for an asymptomatic post-menopausal female. Endometrial sampling should be considered to exclude carcinoma. 2. Right ovarian simple appearing cyst measuring 2.3 cm. This is almost certainly benign, but follow up ultrasound is recommended in 1 year according to the Society of Radiologists in Ultrasound 2010 Consensus Conference Statement (D Clovis Riley et al.   3. Small left para ovarian cyst.  She underwent menopause around age 3 with no hormone replacement.  Had a benign cervical polyp removed at another [place] 4 years ago.  PAP in 11/16 normal.    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  1/18 Endometrial biopsy - RARE SURFACE TYPE ENDOMETRIAL  FRAGMENTS BACKGROUND OF SCANT BENIGN  CERVICAL TISSUE.  - NEGATIVE FOR HYPERPLASIA AND CARCINOMA.    Problem List: Patient Active Problem List   Diagnosis Date Noted  . Thickened endometrium 05/30/2016  . Breast mass in female 04/26/2016  . Malignant neoplasm of upper-outer quadrant of female breast ([place]) 04/26/2016  . HSV-2 (herpes simplex virus 2) infection 03/23/2015  . Insomnia 03/23/2015  . Anxiety 03/04/2012    Past Medical History: Past Medical History:  Diagnosis Date  . Anxiety   . Cancer ([place]) 04/24/2016   right breast.  chemo finished 09/2016  . HSV infection    HISTORY OF HSV  . Personal history of chemotherapy   . PONV (postoperative nausea and vomiting)    nausea no vomiting  . Post-menopausal 10/20/2005  . Postmenopause bleeding   . Thickened endometrium 05/30/2016    Past Surgical History: Past Surgical History:  Procedure Laterality Date  . ANAL FISTULECTOMY    . ENDOMETRIAL BIOPSY    . KNEE SURGERY     snow skating  . PARTIAL MASTECTOMY WITH NEEDLE LOCALIZATION Right 11/30/2016   Procedure: PARTIAL MASTECTOMY WITH NEEDLE LOCALIZATION;  Surgeon: Leonie Green, MD;  Location: [facility] ORS;  Service: General;  Laterality: Right;  . PORTACATH PLACEMENT N/A 05/23/2016   Procedure: INSERTION PORT-A-CATH;  Surgeon: Leonie Green, MD;  Location: [facility] ORS;  Service: General;  Laterality: N/A;  . SENTINEL NODE BIOPSY Right 11/30/2016   Procedure: SENTINEL NODE BIOPSY;  Surgeon: Leonie Green, MD;  Location: [facility] ORS;  Service: General;  Laterality: Right;    OB History:  OB History  Gravida Para Term Preterm AB Living  0 0 0 0 0 0  SAB TAB Ectopic Multiple Live Births  0 0 0 0  Family History: Family History  Problem Relation Age of Onset  . Cancer Mother        BREAST AND BONES  . Breast cancer Mother 58  . Emphysema Father   . Cancer Father   . Cancer Maternal Aunt     Social History: Social History   Social History  .  Marital status: Widowed    Spouse name: N/A  . Number of children: N/A  . Years of education: N/A   Occupational History  . Not on file.   Social History Main Topics  . Smoking status: Never Smoker  . Smokeless tobacco: Never Used  . Alcohol use 3.6 - 4.8 oz/week    3 - 4 Glasses of wine, 3 - 4 Cans of beer per week     Comment: WEEKENDS OCC  . Drug use: No     Comment: In early 62  . Sexual activity: Yes    Partners: Male    Birth control/ protection: Post-menopausal   Other Topics Concern  . Not on file   Social History Narrative  . No narrative on file    Allergies: Allergies  Allergen Reactions  . Penicillins Anaphylaxis    unknown    Current Medications: Current Outpatient Prescriptions  Medication Sig Dispense Refill  . BIOTIN PO Take 1 tablet by mouth daily.     . calcium citrate-vitamin D (CITRACAL+D) 315-200 MG-UNIT tablet Take by mouth.    . citalopram (CELEXA) 20 MG tablet Take 20 mg by mouth at bedtime.     . docusate sodium (COLACE) 100 MG capsule Take 200 mg by mouth at bedtime.    . Multiple Vitamins-Minerals (MULTIVITAMIN ADULTS 50+) TABS Take 1 tablet by mouth daily.    . polycarbophil (FIBERCON) 625 MG tablet Take 1,250 mg by mouth at bedtime.     Marland Kitchen zolpidem (AMBIEN) 10 MG tablet Take 0.5 tablets (5 mg total) by mouth at bedtime. 90 tablet 0  . ALPRAZolam (XANAX) 0.25 MG tablet Take 0.125 mg by mouth 2 (two) times daily as needed for anxiety.     . Calcium Carb-Cholecalciferol (CALCIUM + D3 PO) Take 1 tablet by mouth daily.    Marland Kitchen HYDROcodone-acetaminophen (NORCO) 5-325 MG tablet Take 1-2 tablets by mouth every 4 (four) hours as needed for moderate pain. (Patient not taking: Reported on 02/21/2017) 12 tablet 0  . letrozole (FEMARA) 2.5 MG tablet Take 1 tablet (2.5 mg total) by mouth daily. (Patient not taking: Reported on 02/21/2017) 30 tablet 6  . lidocaine-prilocaine (EMLA) cream Apply 1 application topically as needed. (Patient not taking: Reported on  02/21/2017) 30 g 1   No current facility-administered medications for this visit.     Review of Systems General: negative for, fevers, chills, fatigue, changes in sleep, changes in weight or appetite Skin: negative for changes in color, texture, moles or lesions Eyes: negative for, changes in vision, pain, diplopia HEENT: negative for, change in hearing, pain, discharge, tinnitus, vertigo, voice changes, sore throat, neck masses Pulmonary: negative for, dyspnea, orthopnea, productive cough Cardiac: negative for, palpitations, syncope, pain, discomfort, pressure Gastrointestinal: negative for, dysphagia, nausea, vomiting, jaundice, pain, constipation, diarrhea, hematemesis, hematochezia Genitourinary/Sexual: negative for, dysuria, discharge, hesitancy, nocturia, retention, stones, infections, STD's, incontinence Ob/Gyn: negative for, irregular bleeding, pain Musculoskeletal: negative for, pain, stiffness, swelling, range of motion limitation Hematology: No coagulation disorder, negative for, easy bruising, bleeding Neurologic/Psych: negative for, headaches, seizures, paralysis, weakness, tremor, change in gait, change in sensation, mood swings, depression, anxiety, change in memory  Objective:  Physical Examination:  BP 116/81 (BP Location: Left Arm, Patient Position: Sitting)   Pulse 78   Temp (!) 96.6 F (35.9 C) (Tympanic)   Resp 18   Wt 160 lb 6.4 oz (72.8 kg)   BMI 23.86 kg/m    ECOG Performance Status: 0 - Asymptomatic  General appearance: alert, cooperative and appears stated age HEENT:PERRLA, neck supple with midline trachea and thyroid without masses Lymph node survey: non-palpable, axillary, inguinal, supraclavicular Cardiovascular: regular rate and rhythm, no murmurs or gallops Respiratory: normal air entry, lungs clear to auscultation and no rales, rhonchi or wheezing Breast exam: not examined. Abdomen: soft, non-tender, without masses or organomegaly, no hernias and  well healed incision Back: inspection of back is normal Extremities: extremities normal, atraumatic, no cyanosis or edema Skin exam - normal coloration and turgor, no rashes, no suspicious skin lesions noted. Neurological exam reveals alert, oriented, normal speech, no focal findings or movement disorder noted.  Pelvic: exam chaperoned by nurse;  Vulva: normal appearing vulva with no masses, tenderness or lesions; Vagina: normal vagina; Adnexa: normal adnexa in size, nontender and no masses; Uterus: uterus is normal size, shape, consistency and nontender; Cervix: anteverted; Rectal: normal rectal, no masses  Endometrial biopsy done:  Informed consent signed.  Cervix grasped with tenaculum and Pipelle easily passed into endometrial cavity twice with aspiration and minimal tissue obtained.  Sounded to 6 cm.    Assessment:  Kimberly Clarke is a 61 y.o. G0 post menopausal female diagnosed with thickened endometrium on staging CT scan for breast cancer.  She is asymptomatic without any bleeding and does not take hormone replacement . This is an incidental finding and there is no obvious predisposing risk factor.  Possible it could represent an endometrial polyp, but seems unlikely based on the imaging. No change in endometrium on [place] over 6 months follow up.  Also has simple 2.3 cm right ovarian cyst.    Medical co-morbidities complicating care: breast cancer, anxiety.  Plan:   Problem List Items Addressed This Visit      Other   Thickened endometrium - Primary     Patient will complete radiation for breast cancer next week and start on an AI.  She remains asymptomatic with no vaginal bleeding and US shows stable endometrial thickness.  However, in view of abnormal appearance will have this review by [facility] radiology and if they are concerned we could perform a hysteroscopy and D&C.  Will notify patient of results of review in two weeks.    The patient's diagnosis, an outline of the further  diagnostic and laboratory studies which will be required, the recommendation, and alternatives were discussed.  All questions were answered to the patient's satisfaction.  Mellody Drown, MD  CC:  Bankaitis, Byrd Hesselbach, MD [address] [place], [place] [address] [phone]

## 2017-02-21 NOTE — Progress Notes (Signed)
Here for follow up. Stated neuropathy in hands and feet  improving weekly per pt.

## 2017-02-27 ENCOUNTER — Other Ambulatory Visit: Payer: Self-pay | Admitting: *Deleted

## 2017-02-27 MED ORDER — ANASTROZOLE 1 MG PO TABS: 1.0000 mg | ORAL_TABLET | Freq: Every day | ORAL | 3 refills | Status: DC

## 2017-03-02 NOTE — Progress Notes (Signed)
  Oncology Nurse Navigator Documentation U/S images sent to [facility] for radiology review per Dr. Fransisca Connors. Navigator Location: [facility] (03/02/17 1100)   )Navigator Encounter Type: Diagnostic Results (03/02/17 1100)                     Patient Visit Type: GynOnc (03/02/17 1100)                              Time Spent with Patient: 15 (03/02/17 1100)

## 2017-03-29 ENCOUNTER — Inpatient Hospital Stay: Payer: 59 | Attending: Oncology

## 2017-03-29 DIAGNOSIS — Z452 Encounter for adjustment and management of vascular access device: Secondary | ICD-10-CM | POA: Insufficient documentation

## 2017-03-29 DIAGNOSIS — C50411 Malignant neoplasm of upper-outer quadrant of right female breast: Secondary | ICD-10-CM | POA: Diagnosis present

## 2017-03-29 DIAGNOSIS — Z95828 Presence of other vascular implants and grafts: Secondary | ICD-10-CM

## 2017-03-29 MED ORDER — HEPARIN SOD (PORK) LOCK FLUSH 100 UNIT/ML IV SOLN
500.0000 [IU] | INTRAVENOUS | Status: AC | PRN
  Administered 2017-03-29: 500 [IU]

## 2017-03-29 MED ORDER — SODIUM CHLORIDE 0.9% FLUSH
10.0000 mL | INTRAVENOUS | Status: AC | PRN
  Administered 2017-03-29: 10 mL
  Filled 2017-03-29: qty 10

## 2017-04-23 ENCOUNTER — Other Ambulatory Visit: Payer: Self-pay | Admitting: *Deleted

## 2017-04-23 MED ORDER — ANASTROZOLE 1 MG PO TABS: 1.0000 mg | ORAL_TABLET | Freq: Every day | ORAL | 0 refills | Status: DC

## 2017-04-26 ENCOUNTER — Telehealth: Payer: Self-pay

## 2017-04-26 DIAGNOSIS — R9389 Abnormal findings on diagnostic imaging of other specified body structures: Secondary | ICD-10-CM

## 2017-04-26 NOTE — Telephone Encounter (Signed)
Attempt made to contact Ms. Needham to arrange for 10 months follow up with Dr. Fransisca Connors with U/S prior. Home and mobile voicemail full. Unable to leave message. Oncology Nurse Navigator Documentation  Navigator Location: [facility] (04/26/17 1600)   )Navigator Encounter Type: Telephone;Follow-up Appt (04/26/17 1600)                     Patient Visit Type: GynOnc (04/26/17 1600)                              Time Spent with Patient: 15 (04/26/17 1600)

## 2017-04-26 NOTE — Telephone Encounter (Signed)
My chart message sent and orders placed for Ultrasoundin 10 months along with follow up with Dr. Juanetta Snow. Oncology Nurse Navigator Documentation  Navigator Location: [facility] (04/26/17 1638)   )Navigator Encounter Type: Letter/Fax/Email (04/26/17 1638)                                                    Time Spent with Patient: 15 (04/26/17 1638)

## 2017-05-10 ENCOUNTER — Inpatient Hospital Stay: Payer: 59 | Attending: Oncology

## 2017-05-10 DIAGNOSIS — C50411 Malignant neoplasm of upper-outer quadrant of right female breast: Secondary | ICD-10-CM | POA: Insufficient documentation

## 2017-05-10 DIAGNOSIS — Z452 Encounter for adjustment and management of vascular access device: Secondary | ICD-10-CM | POA: Insufficient documentation

## 2017-05-10 DIAGNOSIS — Z170 Estrogen receptor positive status [ER+]: Secondary | ICD-10-CM | POA: Insufficient documentation

## 2017-05-10 DIAGNOSIS — Z95828 Presence of other vascular implants and grafts: Secondary | ICD-10-CM

## 2017-05-10 MED ORDER — HEPARIN SOD (PORK) LOCK FLUSH 100 UNIT/ML IV SOLN
500.0000 [IU] | INTRAVENOUS | Status: AC | PRN
  Administered 2017-05-10: 500 [IU]

## 2017-05-10 MED ORDER — SODIUM CHLORIDE 0.9% FLUSH
10.0000 mL | INTRAVENOUS | Status: AC | PRN
  Administered 2017-05-10: 10 mL
  Filled 2017-05-10: qty 10

## 2017-05-27 NOTE — Progress Notes (Signed)
[place]  Telephone:(336) [phone] Fax:(336) [phone]  ID: Kimberly Clarke OB: June 11, 1955  MR#: [id number]  EVO#:[id number]  Patient Care Team: Kimberly Rattan, MD as PCP - General (Family Medicine) Kimberly Jacks, RN as Registered Nurse  CHIEF COMPLAINT: Pathologic stage IIa ER/PR positive, HER-2 negative invasive carcinoma of the upper outer quadrant of the right breast.  INTERVAL HISTORY: Patient returns to clinic today for further evaluation. She has completed XRT. Saw Dr. Tamala Julian on 03/12/17. Next mammogram scheduled for January 2019 through his office.   She is tolerating anastrozole well. Hot flashes have nearly resolved. Reports some weight gain.  Has some mild insomnia, on melatonin, and continued anxiety which is chronic and unchanged- on Xanax. . Mild neuropathy symptoms which are stable and unchanged. She performs self breast exams. Port in place without complications.   Denies fever or illness. Denies chest pain or shortness of breath. Denies nausea, vomiting, or constipation. Denies diarrhea or urinary complaints. Patient offers no further specific complaints today.  Excited about her new engagement.   REVIEW OF SYSTEMS:   Review of Systems  Constitutional: Negative for fever, malaise/fatigue and weight loss.  HENT: Negative for congestion and sinus pain.   Respiratory: Negative.  Negative for cough and shortness of breath.   Cardiovascular: Negative for chest pain and leg swelling.  Gastrointestinal: Negative for abdominal pain, constipation, diarrhea, nausea and vomiting.  Genitourinary: Negative.  Negative for frequency and urgency.  Musculoskeletal: Negative.  Negative for joint pain and myalgias.  Neurological: Negative for dizziness, tingling, sensory change, weakness and headaches.  Endo/Heme/Allergies: Does not bruise/bleed easily.  Psychiatric/Behavioral: Negative for depression. The patient is nervous/anxious and has insomnia.      As per HPI. Otherwise, a complete review of systems is negative.  PAST MEDICAL HISTORY: Past Medical History:  Diagnosis Date  . Anxiety   . Cancer ([place]) 04/24/2016   right breast.  chemo finished 09/2016  . HSV infection    HISTORY OF HSV  . Personal history of chemotherapy   . PONV (postoperative nausea and vomiting)    nausea no vomiting  . Post-menopausal 10/20/2005  . Postmenopause bleeding   . Thickened endometrium 05/30/2016    PAST SURGICAL HISTORY: Past Surgical History:  Procedure Laterality Date  . ANAL FISTULECTOMY    . ENDOMETRIAL BIOPSY    . KNEE SURGERY     snow skating  . PARTIAL MASTECTOMY WITH NEEDLE LOCALIZATION Right 11/30/2016   Procedure: PARTIAL MASTECTOMY WITH NEEDLE LOCALIZATION;  Surgeon: Kimberly Green, MD;  Location: [facility] ORS;  Service: General;  Laterality: Right;  . PORTACATH PLACEMENT N/A 05/23/2016   Procedure: INSERTION PORT-A-CATH;  Surgeon: Kimberly Green, MD;  Location: [facility] OR[facility];  Service: General;  Laterality: N/A;  . SENTINEL NODE BIOPSY Right 11/30/2016   Procedure: SENTINEL NODE BIOPSY;  Surgeon: Kimberly Green, MD;  Location: [facility] ORS;  Service: General;  Laterality: Right;    FAMILY HISTORY: Family History  Problem Relation Age of Onset  . Cancer Mother        BREAST AND BONES  . Breast cancer Mother 55  . Emphysema Father   . Cancer Father   . Cancer Maternal Aunt     ADVANCED DIRECTIVES (Y/N):  N  HEALTH MAINTENANCE: Social History   Tobacco Use  . Smoking status: Never Smoker  . Smokeless tobacco: Never Used  Substance Use Topics  . Alcohol use: Yes    Alcohol/week: 3.6 - 4.8 oz    Types: 3 -  4 Glasses of wine, 3 - 4 Cans of beer per week    Comment: WEEKENDS OCC  . Drug use: No    Comment: In early 22     Colonoscopy:  PAP:  Bone density:  Lipid panel:  Allergies  Allergen Reactions  . Penicillins Anaphylaxis    unknown    Current Outpatient Medications  Medication Sig Dispense  Refill  . ALPRAZolam (XANAX) 0.25 MG tablet Take 0.5 tablets (0.125 mg total) by mouth 2 (two) times daily as needed for anxiety. 30 tablet 0  . anastrozole (ARIMIDEX) 1 MG tablet Take 1 tablet (1 mg total) by mouth daily. 90 tablet 0  . BIOTIN PO Take 1 tablet by mouth daily.     . Calcium Carb-Cholecalciferol (CALCIUM + D3 PO) Take 1 tablet by mouth daily.    . calcium citrate-vitamin D (CITRACAL+D) 315-200 MG-UNIT tablet Take by mouth.    . citalopram (CELEXA) 20 MG tablet Take 20 mg by mouth at bedtime.     . docusate sodium (COLACE) 100 MG capsule Take 200 mg by mouth at bedtime.    . lidocaine-prilocaine (EMLA) cream Apply 1 application topically as needed. 30 g 1  . Melatonin 10 MG TABS Take 10 mg by mouth as needed. For sleep    . Multiple Vitamins-Minerals (MULTIVITAMIN ADULTS 50+) TABS Take 1 tablet by mouth daily.    . polycarbophil (FIBERCON) 625 MG tablet Take 1,250 mg by mouth at bedtime.     Marland Kitchen HYDROcodone-acetaminophen (NORCO) 5-325 MG tablet Take 1-2 tablets by mouth every 4 (four) hours as needed for moderate pain. 12 tablet 0  . zolpidem (AMBIEN) 10 MG tablet Take 0.5 tablets (5 mg total) by mouth at bedtime. 90 tablet 0   No current facility-administered medications for this visit.     OBJECTIVE: Vitals:   05/29/17 1524  BP: 120/76  Pulse: 82  Resp: 18  Temp: 98.8 F (37.1 C)     Body mass index is 24.92 kg/m.    ECOG FS:0 - Asymptomatic  General: Well-developed, well-nourished, no acute distress. Eyes: Pink conjunctiva, anicteric sclera. Breasts: Breast exam deferred today.  Lungs: Clear to auscultation bilaterally. Heart: Regular rate and rhythm. No rubs, murmurs, or gallops. Abdomen: Soft, nontender, nondistended. No organomegaly noted, normoactive bowel sounds. Musculoskeletal: No edema, cyanosis, or clubbing. Neuro: Alert, answering all questions appropriately. Cranial nerves grossly intact. Skin: No rashes or petechiae noted. Port in place Psych: Normal  affect. Anxious appearing.   LAB RESULTS:  Lab Results  Component Value Date   NA 137 10/12/2016   K 3.9 10/12/2016   CL 108 10/12/2016   CO2 26 10/12/2016   GLUCOSE 99 10/12/2016   BUN 14 10/12/2016   CREATININE 0.68 10/12/2016   CALCIUM 9.0 10/12/2016   PROT 6.9 10/12/2016   ALBUMIN 4.0 10/12/2016   AST 30 10/12/2016   ALT 28 10/12/2016   ALKPHOS 75 10/12/2016   BILITOT 0.4 10/12/2016   GFRNONAA >60 10/12/2016   GFRAA >60 10/12/2016    Lab Results  Component Value Date   WBC 4.6 10/12/2016   NEUTROABS 2.9 10/12/2016   HGB 11.8 (L) 10/12/2016   HCT 34.1 (L) 10/12/2016   MCV 90.2 10/12/2016   PLT 298 10/12/2016     STUDIES: No results found.  ASSESSMENT: Pathologic stage IIa ER/PR positive, HER-2 negative invasive carcinoma of the upper outer quadrant of the right breast.  PLAN:    1. Pathologic stage IIa ER/PR positive, HER-2 negative invasive carcinoma of the  upper outer quadrant of the right breast: Pathology results reviewed independently with minimal change of her tumor despite neoadjuvant chemotherapy. Patient completed her neoadjuvant chemotherapy on Oct 12, 2016. Her lumpectomy was done on November 30, 2016 by Dr. Tamala Julian. Patient has completed XRT (through [facility]) and has initiated anastrozole which she is tolerating well. Refill provided today. She will take AI for 5 years, completing in October 2023. Mammograms scheduled through Surgery for January 2019. Bone density was not scheduled. Will follow-up for scheduling. Port in place at this time. Consider removing in late 2019 with port flushes every 6 weeks. Return to clinic in 3 months for further evaluation.   2. Endometrial thickening: Patient followed by gyn/onc. Biopsy confirms no evidence of malignancy.  Patient due for ultrasound and follow-up. Will reach out to Navigator for scheduling.   3. Insomnia- stopped Ambien. On Xanax for anxiety. Requesting refill today which I'll send.   4. Peripheral neuropathy:  likely r/t Taxol. Now resolved.   5. Anemia- asymptomatic today. Will check labs at next visit.   Patient expressed understanding and was in agreement with this plan. She also understands that She can call clinic at any time with any questions, concerns, or complaints.   Cancer Staging Malignant neoplasm of upper-outer quadrant of female breast [facility]) Staging form: Breast, AJCC 7th Edition - Clinical stage from 04/26/2016: Stage IIA (T1c, N1, M0) - Signed by Lloyd Huger, MD on 05/25/2016 - Pathologic stage from 12/15/2016: Stage IIA (yT1c, N1a, cM0) - Signed by Lloyd Huger, MD on 12/15/2016   Beckey Rutter, [place], AGNP-C [place] [facility] [phone] (office) 05/29/17 4:42 PM

## 2017-05-29 ENCOUNTER — Inpatient Hospital Stay: Payer: 59 | Attending: Oncology | Admitting: Oncology

## 2017-05-29 ENCOUNTER — Encounter: Payer: Self-pay | Admitting: Oncology

## 2017-05-29 VITALS — BP 120/76 | HR 82 | Temp 98.8°F | Resp 18 | Wt 167.5 lb

## 2017-05-29 DIAGNOSIS — Z170 Estrogen receptor positive status [ER+]: Secondary | ICD-10-CM | POA: Diagnosis not present

## 2017-05-29 DIAGNOSIS — R9389 Abnormal findings on diagnostic imaging of other specified body structures: Secondary | ICD-10-CM

## 2017-05-29 DIAGNOSIS — G629 Polyneuropathy, unspecified: Secondary | ICD-10-CM | POA: Diagnosis not present

## 2017-05-29 DIAGNOSIS — R232 Flushing: Secondary | ICD-10-CM | POA: Diagnosis not present

## 2017-05-29 DIAGNOSIS — Z79899 Other long term (current) drug therapy: Secondary | ICD-10-CM | POA: Diagnosis not present

## 2017-05-29 DIAGNOSIS — Z809 Family history of malignant neoplasm, unspecified: Secondary | ICD-10-CM | POA: Diagnosis not present

## 2017-05-29 DIAGNOSIS — Z79811 Long term (current) use of aromatase inhibitors: Secondary | ICD-10-CM | POA: Diagnosis not present

## 2017-05-29 DIAGNOSIS — Z803 Family history of malignant neoplasm of breast: Secondary | ICD-10-CM | POA: Diagnosis not present

## 2017-05-29 DIAGNOSIS — C50411 Malignant neoplasm of upper-outer quadrant of right female breast: Secondary | ICD-10-CM | POA: Diagnosis not present

## 2017-05-29 DIAGNOSIS — Z9221 Personal history of antineoplastic chemotherapy: Secondary | ICD-10-CM | POA: Insufficient documentation

## 2017-05-29 DIAGNOSIS — F419 Anxiety disorder, unspecified: Secondary | ICD-10-CM | POA: Insufficient documentation

## 2017-05-29 DIAGNOSIS — D649 Anemia, unspecified: Secondary | ICD-10-CM | POA: Diagnosis not present

## 2017-05-29 DIAGNOSIS — G4700 Insomnia, unspecified: Secondary | ICD-10-CM

## 2017-05-29 MED ORDER — ANASTROZOLE 1 MG PO TABS: 1.0000 mg | ORAL_TABLET | Freq: Every day | ORAL | 0 refills | Status: DC

## 2017-05-29 MED ORDER — ALPRAZOLAM 0.25 MG PO TABS: 0.1250 mg | ORAL_TABLET | Freq: Two times a day (BID) | ORAL | 0 refills | Status: DC | PRN

## 2017-06-25 ENCOUNTER — Ambulatory Visit
Admission: RE | Admit: 2017-06-25 | Discharge: 2017-06-25 | Disposition: A | Discharge status: Home / Self Care | Payer: 59 | Source: Ambulatory Visit | Attending: Oncology | Admitting: Oncology

## 2017-06-25 DIAGNOSIS — C50411 Malignant neoplasm of upper-outer quadrant of right female breast: Secondary | ICD-10-CM | POA: Diagnosis not present

## 2017-06-25 DIAGNOSIS — M85851 Other specified disorders of bone density and structure, right thigh: Secondary | ICD-10-CM | POA: Insufficient documentation

## 2017-06-25 DIAGNOSIS — Z780 Asymptomatic menopausal state: Secondary | ICD-10-CM | POA: Insufficient documentation

## 2017-07-14 ENCOUNTER — Other Ambulatory Visit: Payer: Self-pay | Admitting: Oncology

## 2017-07-18 ENCOUNTER — Inpatient Hospital Stay: Payer: 59 | Attending: Oncology

## 2017-07-18 DIAGNOSIS — Z452 Encounter for adjustment and management of vascular access device: Secondary | ICD-10-CM | POA: Diagnosis not present

## 2017-07-18 DIAGNOSIS — Z170 Estrogen receptor positive status [ER+]: Secondary | ICD-10-CM | POA: Diagnosis not present

## 2017-07-18 DIAGNOSIS — Z95828 Presence of other vascular implants and grafts: Secondary | ICD-10-CM

## 2017-07-18 DIAGNOSIS — C50411 Malignant neoplasm of upper-outer quadrant of right female breast: Secondary | ICD-10-CM | POA: Insufficient documentation

## 2017-07-18 MED ORDER — LIDOCAINE-PRILOCAINE 2.5-2.5 % EX CREA: 1.0000 "application " | TOPICAL_CREAM | CUTANEOUS | 1 refills | Status: DC | PRN

## 2017-07-18 MED ORDER — HEPARIN SOD (PORK) LOCK FLUSH 100 UNIT/ML IV SOLN
500.0000 [IU] | Freq: Once | INTRAVENOUS | Status: AC
  Administered 2017-07-18: 500 [IU] via INTRAVENOUS

## 2017-07-18 MED ORDER — SODIUM CHLORIDE 0.9% FLUSH
10.0000 mL | INTRAVENOUS | Status: DC | PRN
  Administered 2017-07-18: 10 mL via INTRAVENOUS
  Filled 2017-07-18: qty 10

## 2017-08-26 NOTE — Progress Notes (Signed)
[place]  Telephone:(336) [phone] Fax:(336) [phone]  ID: Kimberly Clarke OB: 1955/06/23  MR#: [id number]  YKZ#:[id number]  [name], MD as PCP - General (Family Medicine) Theodore Demark, RN as Oncology Nurse Navigator Tamala Julian, Hillery [name], MD as Referring Physician (Surgery) Lloyd Huger, MD as Consulting Physician (Oncology) Clent Jacks, RN as Registered Nurse Ali Lowe, Ellwood Dense, MD as Referring Physician (Radiation Oncology)  CHIEF COMPLAINT: Pathologic stage IIa ER/PR positive, HER-2 negative invasive carcinoma of the upper outer quadrant of the right breast.  INTERVAL HISTORY: Patient returns to clinic today for routine 4-monthevaluation.  She is tolerating anastrozole well without significant side effects.  She recently got engaged and is getting married this summer.  She currently feels well and is asymptomatic. She has no other neurologic complaints. She denies any recent fevers or illnesses. She has a good appetite and denies weight loss. She has no chest pain or shortness of breath. She denies any nausea, vomiting, constipation, or diarrhea. She has no urinary complaints. Patient offers no specific complaints today.   REVIEW OF SYSTEMS:   Review of Systems  Constitutional: Negative.  Negative for fever, malaise/fatigue and weight loss.  HENT: Negative for congestion and sinus pain.   Respiratory: Negative.  Negative for cough and shortness of breath.   Cardiovascular: Negative.  Negative for chest pain and leg swelling.  Gastrointestinal: Negative for abdominal pain, constipation, diarrhea, nausea and vomiting.  Genitourinary: Negative.  Negative for dysuria.  Musculoskeletal: Negative.  Negative for joint pain.  Skin: Negative.  Negative for rash.  Neurological: Negative.  Negative for tingling, sensory change and weakness.  Psychiatric/Behavioral: Negative for depression. The patient is nervous/anxious and  has insomnia.     As per HPI. Otherwise, a complete review of systems is negative.  PAST MEDICAL HISTORY: Past Medical History:  Diagnosis Date  . Anxiety   . [place]   right breast.  chemo finished 09/2016  . HSV infection    HISTORY OF HSV  . Personal history of chemotherapy   . PONV (postoperative nausea and vomiting)    nausea no vomiting  . Post-menopausal 10/20/2005  . Postmenopause bleeding   . Thickened endometrium 05/30/2016    PAST SURGICAL HISTORY: Past Surgical History:  Procedure Laterality Date  . ANAL FISTULECTOMY    . ENDOMETRIAL BIOPSY    . KNEE SURGERY     snow skating  . PARTIAL MASTECTOMY WITH NEEDLE LOCALIZATION Right 11/30/2016   Procedure: PARTIAL MASTECTOMY WITH NEEDLE LOCALIZATION;  Surgeon: SLeonie Green MD;  Location: [facility] ORS;  Service: General;  Laterality: Right;  . PORTACATH PLACEMENT N/A 05/23/2016   Procedure: INSERTION PORT-A-CATH;  Surgeon: JLeonie Green MD;  Location: [facility] OR[facility];  Service: General;  Laterality: N/A;  . SENTINEL NODE BIOPSY Right 11/30/2016   Procedure: SENTINEL NODE BIOPSY;  Surgeon: SLeonie Green MD;  Location: [facility] ORS;  Service: General;  Laterality: Right;    FAMILY HISTORY: Family History  Problem Relation Age of Onset  . Cancer Mother        BREAST AND BONES  . Breast cancer Mother 567 . Emphysema Father   . Cancer Father   . Cancer Maternal Aunt     ADVANCED DIRECTIVES (Y/N):  N  HEALTH MAINTENANCE: Social History   Tobacco Use  . Smoking status: Never Smoker  . Smokeless tobacco: Never Used  Substance Use Topics  . Alcohol use: Yes    Alcohol/week: 3.6 -  4.8 oz    Types: 3 - 4 Glasses of wine, 3 - 4 Cans of beer per week    Comment: WEEKENDS OCC  . Drug use: No    Types: Marijuana    Comment: In early 22     Colonoscopy:  PAP:  Bone density:  Lipid panel:  Allergies  Allergen Reactions  . Penicillins Anaphylaxis    unknown    Current Outpatient  Medications  Medication Sig Dispense Refill  . ALPRAZolam (XANAX) 0.25 MG tablet Take 0.5 tablets (0.125 mg total) by mouth 2 (two) times daily as needed for anxiety. 30 tablet 0  . anastrozole (ARIMIDEX) 1 MG tablet Take 1 tablet (1 mg total) by mouth daily. 90 tablet 0  . anastrozole (ARIMIDEX) 1 MG tablet TAKE 1 TABLET BY MOUTH EVERY DAY 90 tablet 0  . BIOTIN PO Take 1 tablet by mouth daily.     . Calcium Carb-Cholecalciferol (CALCIUM + D3 PO) Take 1 tablet by mouth daily.    . calcium citrate-vitamin D (CITRACAL+D) 315-200 MG-UNIT tablet Take by mouth.    . citalopram (CELEXA) 20 MG tablet Take 20 mg by mouth at bedtime.     . docusate sodium (COLACE) 100 MG capsule Take 200 mg by mouth at bedtime.    Marland Kitchen HYDROcodone-acetaminophen (NORCO) 5-325 MG tablet Take 1-2 tablets by mouth every 4 (four) hours as needed for moderate pain. 12 tablet 0  . lidocaine-prilocaine (EMLA) cream Apply 1 application topically as needed. 30 g 1  . Melatonin 10 MG TABS Take 10 mg by mouth as needed. For sleep    . Multiple Vitamins-Minerals (MULTIVITAMIN ADULTS 50+) TABS Take 1 tablet by mouth daily.    . polycarbophil (FIBERCON) 625 MG tablet Take 1,250 mg by mouth at bedtime.     Marland Kitchen zolpidem (AMBIEN) 10 MG tablet Take 0.5 tablets (5 mg total) by mouth at bedtime. 90 tablet 0   No current facility-administered medications for this visit.     OBJECTIVE: There were no vitals filed for this visit.   There is no height or weight on file to calculate BMI.    ECOG FS:0 - Asymptomatic  General: Well-developed, well-nourished, no acute distress. Eyes: Pink conjunctiva, anicteric sclera. Breast: Exam deferred today. Lungs: Clear to auscultation bilaterally. Heart: Regular rate and rhythm. No rubs, murmurs, or gallops. Abdomen: Soft, nontender, nondistended. No organomegaly noted, normoactive bowel sounds. Musculoskeletal: No edema, cyanosis, or clubbing. Neuro: Alert, answering all questions appropriately. Cranial  nerves grossly intact. Skin: No rashes or petechiae noted. Psych: Normal affect.   LAB RESULTS:  Lab Results  Component Value Date   NA 137 10/12/2016   K 3.9 10/12/2016   CL 108 10/12/2016   CO2 26 10/12/2016   GLUCOSE 99 10/12/2016   BUN 14 10/12/2016   CREATININE 0.68 10/12/2016   CALCIUM 9.0 10/12/2016   PROT 6.9 10/12/2016   ALBUMIN 4.0 10/12/2016   AST 30 10/12/2016   ALT 28 10/12/2016   ALKPHOS 75 10/12/2016   BILITOT 0.4 10/12/2016   GFRNONAA >60 10/12/2016   GFRAA >60 10/12/2016    Lab Results  Component Value Date   WBC 4.6 10/12/2016   NEUTROABS 2.9 10/12/2016   HGB 11.8 (L) 10/12/2016   HCT 34.1 (L) 10/12/2016   MCV 90.2 10/12/2016   PLT 298 10/12/2016     STUDIES: No results found.  ASSESSMENT: Pathologic stage IIa ER/PR positive, HER-2 negative invasive carcinoma of the upper outer quadrant of the right breast.  PLAN:  1. Pathologic stage IIa ER/PR positive, HER-2 negative invasive carcinoma of the upper outer quadrant of the right breast: Pathology results reviewed independently with minimal change of her tumor despite neoadjuvant chemotherapy. Patient completed her neoadjuvant chemotherapy on Oct 12, 2016. Her lumpectomy was done on November 30, 2016. Patient completed her XRT at [facility] in approximately October 2018.  She could not tolerate letrozole, and was switched to anastrozole.  Continue treatment for 5 years completing in October 2023.  Patient will require repeat mammogram in July 2019. Return to clinic in 6 months for further evaluation.  2. Endometrial thickening: Patient was previously seen by gynecology oncology. Biopsy confirmed no evidence of malignancy.  Continued routine Pap smears as recommended. 3. Anemia: Mild, monitor. 4. Peripheral neuropathy: Patient does not complain of this today. 5. Insomnia: Chronic.  Continue Ambien and melatonin as needed. 6.  Osteopenia: Patient had a bone mineral density on June 25, 2017 that reported a T  score of -1.6.  Continue calcium and vitamin D supplementation.  Repeat in February 2020.  Patient expressed understanding and was in agreement with this plan. She also understands that She can call clinic at any time with any questions, concerns, or complaints.   Cancer Staging Malignant neoplasm of upper-outer quadrant of female breast [facility]) Staging form: Breast, AJCC 7th Edition - Clinical stage from 04/26/2016: Stage IIA (T1c, N1, M0) - Signed by Lloyd Huger, MD on 05/25/2016 - Pathologic stage from 12/15/2016: Stage IIA (yT1c, N1a, cM0) - Signed by Lloyd Huger, MD on 12/15/2016    Lloyd Huger, MD 08/26/17 8:53 AM

## 2017-08-28 ENCOUNTER — Inpatient Hospital Stay: Payer: 59 | Attending: Oncology | Admitting: Oncology

## 2017-08-28 ENCOUNTER — Encounter: Payer: Self-pay | Admitting: *Deleted

## 2017-08-28 ENCOUNTER — Inpatient Hospital Stay: Payer: 59

## 2017-08-28 VITALS — BP 143/92 | HR 82 | Temp 98.4°F | Resp 18 | Wt 171.3 lb

## 2017-08-28 DIAGNOSIS — G629 Polyneuropathy, unspecified: Secondary | ICD-10-CM | POA: Insufficient documentation

## 2017-08-28 DIAGNOSIS — Z923 Personal history of irradiation: Secondary | ICD-10-CM | POA: Diagnosis not present

## 2017-08-28 DIAGNOSIS — Z803 Family history of malignant neoplasm of breast: Secondary | ICD-10-CM

## 2017-08-28 DIAGNOSIS — D649 Anemia, unspecified: Secondary | ICD-10-CM

## 2017-08-28 DIAGNOSIS — G4730 Sleep apnea, unspecified: Secondary | ICD-10-CM | POA: Insufficient documentation

## 2017-08-28 DIAGNOSIS — Z800 Family history of malignant neoplasm of digestive organs: Secondary | ICD-10-CM | POA: Diagnosis not present

## 2017-08-28 DIAGNOSIS — C50411 Malignant neoplasm of upper-outer quadrant of right female breast: Secondary | ICD-10-CM | POA: Diagnosis not present

## 2017-08-28 DIAGNOSIS — Z95828 Presence of other vascular implants and grafts: Secondary | ICD-10-CM

## 2017-08-28 DIAGNOSIS — Z9221 Personal history of antineoplastic chemotherapy: Secondary | ICD-10-CM | POA: Diagnosis not present

## 2017-08-28 DIAGNOSIS — M8580 Other specified disorders of bone density and structure, unspecified site: Secondary | ICD-10-CM

## 2017-08-28 DIAGNOSIS — F419 Anxiety disorder, unspecified: Secondary | ICD-10-CM | POA: Diagnosis not present

## 2017-08-28 DIAGNOSIS — Z170 Estrogen receptor positive status [ER+]: Secondary | ICD-10-CM | POA: Insufficient documentation

## 2017-08-28 DIAGNOSIS — Z79811 Long term (current) use of aromatase inhibitors: Secondary | ICD-10-CM | POA: Diagnosis not present

## 2017-08-28 DIAGNOSIS — Z79899 Other long term (current) drug therapy: Secondary | ICD-10-CM | POA: Diagnosis not present

## 2017-08-28 MED ORDER — HEPARIN SOD (PORK) LOCK FLUSH 100 UNIT/ML IV SOLN
500.0000 [IU] | INTRAVENOUS | Status: AC | PRN
  Administered 2017-08-28: 500 [IU]

## 2017-08-28 MED ORDER — SODIUM CHLORIDE 0.9% FLUSH
10.0000 mL | INTRAVENOUS | Status: AC | PRN
  Administered 2017-08-28: 10 mL
  Filled 2017-08-28: qty 10

## 2017-08-28 NOTE — Progress Notes (Signed)
Patient denies any concerns today.  

## 2017-10-05 ENCOUNTER — Other Ambulatory Visit: Payer: Self-pay | Admitting: Oncology

## 2017-10-09 ENCOUNTER — Inpatient Hospital Stay: Payer: 59 | Attending: Oncology

## 2017-10-09 DIAGNOSIS — Z452 Encounter for adjustment and management of vascular access device: Secondary | ICD-10-CM | POA: Diagnosis not present

## 2017-10-09 DIAGNOSIS — Z170 Estrogen receptor positive status [ER+]: Secondary | ICD-10-CM | POA: Insufficient documentation

## 2017-10-09 DIAGNOSIS — C50411 Malignant neoplasm of upper-outer quadrant of right female breast: Secondary | ICD-10-CM | POA: Diagnosis not present

## 2017-10-09 DIAGNOSIS — Z95828 Presence of other vascular implants and grafts: Secondary | ICD-10-CM

## 2017-10-09 MED ORDER — SODIUM CHLORIDE 0.9% FLUSH
10.0000 mL | Freq: Once | INTRAVENOUS | Status: AC
  Administered 2017-10-09: 10 mL via INTRAVENOUS
  Filled 2017-10-09: qty 10

## 2017-10-09 MED ORDER — HEPARIN SOD (PORK) LOCK FLUSH 100 UNIT/ML IV SOLN
500.0000 [IU] | Freq: Once | INTRAVENOUS | Status: AC
  Administered 2017-10-09: 500 [IU] via INTRAVENOUS

## 2017-10-09 MED ORDER — HEPARIN SOD (PORK) LOCK FLUSH 100 UNIT/ML IV SOLN
INTRAVENOUS | Status: AC
  Filled 2017-10-09: qty 5

## 2017-11-06 ENCOUNTER — Inpatient Hospital Stay: Payer: 59 | Attending: Oncology

## 2017-11-06 DIAGNOSIS — Z452 Encounter for adjustment and management of vascular access device: Secondary | ICD-10-CM | POA: Insufficient documentation

## 2017-11-06 DIAGNOSIS — Z95828 Presence of other vascular implants and grafts: Secondary | ICD-10-CM

## 2017-11-06 DIAGNOSIS — Z170 Estrogen receptor positive status [ER+]: Secondary | ICD-10-CM | POA: Insufficient documentation

## 2017-11-06 DIAGNOSIS — C50411 Malignant neoplasm of upper-outer quadrant of right female breast: Secondary | ICD-10-CM | POA: Diagnosis not present

## 2017-11-06 MED ORDER — HEPARIN SOD (PORK) LOCK FLUSH 100 UNIT/ML IV SOLN
500.0000 [IU] | Freq: Once | INTRAVENOUS | Status: AC
  Administered 2017-11-06: 500 [IU] via INTRAVENOUS

## 2017-11-06 MED ORDER — SODIUM CHLORIDE 0.9% FLUSH
10.0000 mL | Freq: Once | INTRAVENOUS | Status: AC
  Administered 2017-11-06: 10 mL via INTRAVENOUS
  Filled 2017-11-06: qty 10

## 2017-11-21 ENCOUNTER — Other Ambulatory Visit: Payer: 59

## 2017-12-09 ENCOUNTER — Other Ambulatory Visit: Payer: Self-pay | Admitting: Oncology

## 2018-01-01 ENCOUNTER — Inpatient Hospital Stay: Payer: 59 | Attending: Oncology

## 2018-01-01 DIAGNOSIS — Z170 Estrogen receptor positive status [ER+]: Secondary | ICD-10-CM | POA: Insufficient documentation

## 2018-01-01 DIAGNOSIS — Z452 Encounter for adjustment and management of vascular access device: Secondary | ICD-10-CM | POA: Insufficient documentation

## 2018-01-01 DIAGNOSIS — C50411 Malignant neoplasm of upper-outer quadrant of right female breast: Secondary | ICD-10-CM | POA: Diagnosis not present

## 2018-01-01 DIAGNOSIS — Z95828 Presence of other vascular implants and grafts: Secondary | ICD-10-CM

## 2018-01-01 MED ORDER — SODIUM CHLORIDE 0.9% FLUSH
10.0000 mL | INTRAVENOUS | Status: DC | PRN
  Administered 2018-01-01: 10 mL via INTRAVENOUS
  Filled 2018-01-01: qty 10

## 2018-01-01 MED ORDER — HEPARIN SOD (PORK) LOCK FLUSH 100 UNIT/ML IV SOLN
500.0000 [IU] | Freq: Once | INTRAVENOUS | Status: AC
  Administered 2018-01-01: 500 [IU] via INTRAVENOUS

## 2018-01-15 ENCOUNTER — Ambulatory Visit
Admission: RE | Admit: 2018-01-15 | Discharge: 2018-01-15 | Disposition: A | Discharge status: Home / Self Care | Payer: 59 | Source: Ambulatory Visit | Attending: Oncology | Admitting: Oncology

## 2018-01-15 DIAGNOSIS — C50411 Malignant neoplasm of upper-outer quadrant of right female breast: Secondary | ICD-10-CM | POA: Insufficient documentation

## 2018-01-15 DIAGNOSIS — Z170 Estrogen receptor positive status [ER+]: Principal | ICD-10-CM

## 2018-01-15 DIAGNOSIS — R922 Inconclusive mammogram: Secondary | ICD-10-CM | POA: Diagnosis not present

## 2018-01-15 HISTORY — DX: Personal history of irradiation: Z92.3

## 2018-01-18 NOTE — Progress Notes (Signed)
  Oncology Nurse Navigator Documentation  Navigator Location: [facility] (01/18/18 1400)   )Navigator Encounter Type: Follow-up Appt (01/18/18 1400)                     Patient Visit Type: Follow-up (01/18/18 1400)   Barriers/Navigation Needs: Coordination of Care (01/18/18 1400)                          Time Spent with Patient: 45 (01/18/18 1400)   Patient requests port removal.  Per Dr. Grayland Ormond, orders faxed to [facility] surgery. Port removal scheduled for 01/28/18 at 1:30 with Dr. Lysle Pearl.  Patient notified, and instructed not  To take aspirin, blood thinners prior to port removal. Patient to call office for further instruction or questions.  Reminded of follow-up with Dr. Grayland Ormond on 02/27/18 at 2:00.

## 2018-01-28 DIAGNOSIS — Z452 Encounter for adjustment and management of vascular access device: Secondary | ICD-10-CM | POA: Diagnosis not present

## 2018-02-13 DIAGNOSIS — Z452 Encounter for adjustment and management of vascular access device: Secondary | ICD-10-CM | POA: Diagnosis not present

## 2018-02-19 ENCOUNTER — Ambulatory Visit
Admission: RE | Admit: 2018-02-19 | Discharge: 2018-02-19 | Disposition: A | Discharge status: Home / Self Care | Payer: 59 | Source: Ambulatory Visit | Attending: Obstetrics and Gynecology | Admitting: Obstetrics and Gynecology

## 2018-02-19 DIAGNOSIS — R9389 Abnormal findings on diagnostic imaging of other specified body structures: Secondary | ICD-10-CM | POA: Diagnosis not present

## 2018-02-19 DIAGNOSIS — N8500 Endometrial hyperplasia, unspecified: Secondary | ICD-10-CM | POA: Diagnosis not present

## 2018-02-19 DIAGNOSIS — N83291 Other ovarian cyst, right side: Secondary | ICD-10-CM | POA: Insufficient documentation

## 2018-02-20 ENCOUNTER — Inpatient Hospital Stay: Payer: 59 | Attending: Obstetrics and Gynecology | Admitting: Obstetrics and Gynecology

## 2018-02-20 ENCOUNTER — Encounter: Payer: Self-pay | Admitting: Obstetrics and Gynecology

## 2018-02-20 VITALS — BP 120/78 | HR 102 | Temp 98.7°F | Resp 18 | Ht 68.87 in | Wt 173.3 lb

## 2018-02-20 DIAGNOSIS — N841 Polyp of cervix uteri: Secondary | ICD-10-CM | POA: Diagnosis not present

## 2018-02-20 DIAGNOSIS — F419 Anxiety disorder, unspecified: Secondary | ICD-10-CM | POA: Diagnosis not present

## 2018-02-20 DIAGNOSIS — Z801 Family history of malignant neoplasm of trachea, bronchus and lung: Secondary | ICD-10-CM | POA: Diagnosis not present

## 2018-02-20 DIAGNOSIS — Z170 Estrogen receptor positive status [ER+]: Secondary | ICD-10-CM | POA: Insufficient documentation

## 2018-02-20 DIAGNOSIS — C50411 Malignant neoplasm of upper-outer quadrant of right female breast: Secondary | ICD-10-CM | POA: Diagnosis not present

## 2018-02-20 DIAGNOSIS — Z79811 Long term (current) use of aromatase inhibitors: Secondary | ICD-10-CM | POA: Diagnosis not present

## 2018-02-20 DIAGNOSIS — Z79899 Other long term (current) drug therapy: Secondary | ICD-10-CM | POA: Insufficient documentation

## 2018-02-20 DIAGNOSIS — R9389 Abnormal findings on diagnostic imaging of other specified body structures: Secondary | ICD-10-CM | POA: Insufficient documentation

## 2018-02-20 DIAGNOSIS — M8580 Other specified disorders of bone density and structure, unspecified site: Secondary | ICD-10-CM | POA: Diagnosis not present

## 2018-02-20 DIAGNOSIS — N83291 Other ovarian cyst, right side: Secondary | ICD-10-CM | POA: Diagnosis not present

## 2018-02-20 DIAGNOSIS — Z9221 Personal history of antineoplastic chemotherapy: Secondary | ICD-10-CM | POA: Diagnosis not present

## 2018-02-20 DIAGNOSIS — Z923 Personal history of irradiation: Secondary | ICD-10-CM | POA: Insufficient documentation

## 2018-02-20 NOTE — Progress Notes (Signed)
She has no pain, she does not have any gyn concerns

## 2018-02-20 NOTE — Progress Notes (Signed)
Gynecologic Oncology Interval Visit   Referring Provider: Dr Grayland Ormond  Chief Concern: Thickened endometrium detected on staging CT scan for breast cancer.   Subjective:  Kimberly Clarke is a 62 y.o. G32 female diagnosed with right breast cancer with incidental finding of thickened endometrium up to 1.4 cm, who returns to clinic today for follow up.   Pelvic Ultrasound was repeated on 02/19/2018 which showed: - Endometrial thickness 10.3 mm described as heterogeneous echotexture with multifocal cystic changes.  - Right Ovary- 2.7 x 2.0 x 2.1 cm. Simple cyst arising from right ovary measuring 2.6 x 1.9 x 2.3 cm.  - Left Ovary- not visualized  Today, she reports no bleeding or other concerning gyn symptoms.   Oncology History:  Pelvic US 12/19 IMPRESSION: 1. Thickened heterogeneous endometrium with cystic spaces.Thickness: 13 mm.  Heterogeneous with cystic spaces and vascularity. Endometrial thickness is considered abnormal for an asymptomatic post-menopausal female. Endometrial sampling should be considered to exclude carcinoma. 2. Right ovarian simple appearing cyst measuring 2.3 cm. This is almost certainly benign, but follow up ultrasound is recommended in 1 year according to the Society of Radiologists in Ultrasound 2010 Consensus Conference Statement (D Clovis Riley et al.   3. Small left para ovarian cyst.  She underwent menopause around age 45 with no hormone replacement.  Had a benign cervical polyp removed at another [place] 4 years ago.  PAP in 11/16 normal.    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  1/18 Endometrial biopsy - RARE SURFACE TYPE ENDOMETRIAL FRAGMENTS BACKGROUND OF SCANT BENIGN CERVICAL TISSUE.  - NEGATIVE FOR HYPERPLASIA AND CARCINOMA.   Her lumpectomy was done on November 30, 2016 by Dr. Tamala Julian. She completed radiation therapy through [facility] and initiated anastrozole.   Pelvic US 11/20/16 which showed stable but thickened endometrium  measuring 12.38mm. Endometrial echotexture reported as heterogenous and hypervascular. Endometrial sampling was recommended. Simple appearing right ovarian cyst measuring 2.3 cm in greatest dimension is stable.  Problem List: Patient Active Problem List   Diagnosis Date Noted  . Thickened endometrium 05/30/2016  . Breast mass in female 04/26/2016  . Malignant neoplasm of upper-outer quadrant of female breast ([place]) 04/26/2016  . HSV-2 (herpes simplex virus 2) infection 03/23/2015  . Insomnia 03/23/2015  . Anxiety 03/04/2012    Past Medical History: Past Medical History:  Diagnosis Date  . Anxiety   . Cancer ([place]) 04/24/2016   right breast.  chemo finished 09/2016  . HSV infection    HISTORY OF HSV  . Personal history of chemotherapy   . Personal history of radiation therapy   . PONV (postoperative nausea and vomiting)    nausea no vomiting  . Post-menopausal 10/20/2005  . Postmenopause bleeding   . Thickened endometrium 05/30/2016    Past Surgical History: Past Surgical History:  Procedure Laterality Date  . ANAL FISTULECTOMY    . BREAST LUMPECTOMY Right 11/30/2016  . ENDOMETRIAL BIOPSY    . KNEE SURGERY     snow skating  . PARTIAL MASTECTOMY WITH NEEDLE LOCALIZATION Right 11/30/2016   Procedure: PARTIAL MASTECTOMY WITH NEEDLE LOCALIZATION;  Surgeon: Leonie Green, MD;  Location: [facility] ORS;  Service: General;  Laterality: Right;  . PORTACATH PLACEMENT N/A 05/23/2016   Procedure: INSERTION PORT-A-CATH;  Surgeon: Leonie Green, MD;  Location: [facility] ORS;  Service: General;  Laterality: N/A;  . SENTINEL NODE BIOPSY Right 11/30/2016   Procedure: SENTINEL NODE BIOPSY;  Surgeon: Leonie Green, MD;  Location: [facility] ORS;  Service:  General;  Laterality: Right;    OB History:  OB History  Gravida Para Term Preterm AB Living  0 0 0 0 0 0  SAB TAB Ectopic Multiple Live Births  0 0 0 0      Family History: Family History  Problem Relation Age of Onset  . Cancer  Mother        BREAST AND BONES  . Breast cancer Mother 56  . Emphysema Father   . Cancer Father   . Cancer Maternal Aunt     Social History: Social History   Socioeconomic History  . Marital status: Widowed    Spouse name: Not on file  . Number of children: Not on file  . Years of education: Not on file  . Highest education level: Not on file  Occupational History  . Not on file  Social Needs  . Financial resource strain: Not on file  . Food insecurity:    Worry: Not on file    Inability: Not on file  . Transportation needs:    Medical: Not on file    Non-medical: Not on file  Tobacco Use  . Smoking status: Never Smoker  . Smokeless tobacco: Never Used  Substance and Sexual Activity  . Alcohol use: Yes    Alcohol/week: 6.0 - 8.0 standard drinks    Types: 3 - 4 Glasses of wine, 3 - 4 Cans of beer per week    Comment: WEEKENDS OCC  . Drug use: No    Types: Marijuana    Comment: In early 48  . Sexual activity: Yes    Partners: Male    Birth control/protection: Post-menopausal  Lifestyle  . Physical activity:    Days per week: Not on file    Minutes per session: Not on file  . Stress: Not on file  Relationships  . Social connections:    Talks on phone: Not on file    Gets together: Not on file    Attends religious service: Not on file    Active member of club or organization: Not on file    Attends meetings of clubs or organizations: Not on file    Relationship status: Not on file  . Intimate partner violence:    Fear of current or ex partner: Not on file    Emotionally abused: Not on file    Physically abused: Not on file    Forced sexual activity: Not on file  Other Topics Concern  . Not on file  Social History Narrative  . Not on file    Allergies: Allergies  Allergen Reactions  . Penicillins Anaphylaxis    unknown    Current Medications: Current Outpatient Medications  Medication Sig Dispense Refill  . ALPRAZolam (XANAX) 0.25 MG tablet Take  0.5 tablets (0.125 mg total) by mouth 2 (two) times daily as needed for anxiety. 30 tablet 0  . anastrozole (ARIMIDEX) 1 MG tablet Take 1 tablet (1 mg total) by mouth daily. 90 tablet 0  . BIOTIN PO Take 1 tablet by mouth daily.     . calcium citrate-vitamin D (CITRACAL+D) 315-200 MG-UNIT tablet Take by mouth.    . docusate sodium (COLACE) 100 MG capsule Take 200 mg by mouth at bedtime.    . Melatonin 10 MG TABS Take 10 mg by mouth at bedtime. For sleep    . Multiple Vitamins-Minerals (MULTIVITAMIN ADULTS 50+) TABS Take 1 tablet by mouth daily.    . polycarbophil (FIBERCON) 625 MG tablet Take 1,250 mg  by mouth at bedtime.      No current facility-administered medications for this visit.     Review of Systems General:  no complaints Skin: no complaints Eyes: no complaints HEENT: no complaints Breasts: no complaints Pulmonary: no complaints Cardiac: no complaints Gastrointestinal: no complaints Genitourinary/Sexual: no complaints Ob/Gyn: no complaints Musculoskeletal: no complaints Hematology: no complaints Neurologic/Psych: no complaints   Objective:  Physical Examination:  BP 120/78   Pulse (!) 102   Temp 98.7 F (37.1 C) (Tympanic)   Resp 18   Ht 5' 8.87" (1.749 m)   Wt 173 lb 4.8 oz (78.6 kg)   BMI 25.69 kg/m    ECOG Performance Status: 0 - Asymptomatic  GENERAL: Patient is a well appearing female in no acute distress HEENT:  PERRL, neck supple with midline trachea. Thyroid without masses.  NODES:  No cervical, supraclavicular, axillary, or inguinal lymphadenopathy palpated.  LUNGS:  Clear to auscultation bilaterally.  No wheezes or rhonchi. HEART:  Regular rate and rhythm. No murmur appreciated. ABDOMEN:  Soft, nontender.  Positive, normoactive bowel sounds. MSK:  No focal spinal tenderness to palpation. Full range of motion bilaterally in the upper extremities. EXTREMITIES:  No peripheral edema.   SKIN:  Clear with no obvious rashes or skin changes. No nail  dyscrasia. NEURO:  Nonfocal. Well oriented.  Appropriate affect.  Pelvic: exam chaperoned by nurse;  Vulva: normal appearing vulva with no masses, tenderness or lesions; Vagina: normal vagina; Adnexa: normal adnexa in size, nontender and no masses; Uterus: uterus is normal size, shape, consistency and nontender; Cervix: anteverted; Rectal: normal rectal, no masses   Assessment:  MAYIA MEGILL is a 62 y.o. G0 post menopausal female diagnosed with thickened endometrium on staging CT scan for breast cancer.  She is asymptomatic without any bleeding and does not take hormone replacement . This is an incidental finding and there is no obvious predisposing risk factor.  Possible it could represent an endometrial polyp, but seems unlikely based on the imaging. No change in endometrium on [place] over follow up in past year.  Also has simple 2.3 cm right ovarian cyst.    Medical co-morbidities complicating care: breast cancer, anxiety.  Plan:   Problem List Items Addressed This Visit      Other   Thickened endometrium - Primary     She has completed radiation therapy for breast cancer and is tolerating anastrozole well. She remains asymptomatic without vaginal bleeding and repeat ultrasound shows stable endometrial thickness. Previously, [facility] radiology reviewed her ultrasound to evaluate for hysteroscopy and D&C which was not recommended.   At this point recommend she follow up with her regular gynecologist Dr Kennon Rounds. We can see her back if she has any bleeding or other concerns.   The patient's diagnosis, an outline of the further diagnostic and laboratory studies which will be required, the recommendation, and alternatives were discussed.  All questions were answered to the patient's satisfaction.  Beckey Rutter, DNP, AGNP-C [place] [facility] [phone] (work cell) [phone] (office)  CC:  Bankaitis, Byrd Hesselbach, MD [address], [place]  [phone]  I personally interviewed and examined the patient. Agreed with the above/below plan of care. Patient/family questions were answered.  Mellody Drown, MD

## 2018-02-20 NOTE — Patient Instructions (Signed)
Follow up with regular GYN provider in 1 yr

## 2018-02-25 NOTE — Progress Notes (Signed)
[place]  Telephone:([phone]) [phone] Fax:(336) [phone]  ID: Kimberly Clarke OB: Apr 04, 1956  MR#: [id number]  AOZ#:[id number]  [name], MD as PCP - General (Family Medicine) Theodore Demark, RN as Oncology Nurse Navigator Tamala Julian, Hillery [name], MD as Referring Physician (Surgery) Lloyd Huger, MD as Consulting Physician (Oncology) Clent Jacks, RN as Registered Nurse Ali Lowe, Ellwood Dense, MD as Referring Physician (Radiation Oncology)  CHIEF COMPLAINT: Pathologic stage IIa ER/PR positive, HER-2 negative invasive carcinoma of the upper outer quadrant of the right breast.  INTERVAL HISTORY: Patient returns to clinic today for routine six-month evaluation.  She continues to tolerate anastrozole well without significant side effects.  She currently feels well and is asymptomatic.  She has no neurologic complaints.  She denies any recent fevers or illnesses. She has a good appetite and denies weight loss. She has no chest pain or shortness of breath. She denies any nausea, vomiting, constipation, or diarrhea. She has no urinary complaints.  Patient feels at her baseline offers no specific complaints today.  REVIEW OF SYSTEMS:   Review of Systems  Constitutional: Negative.  Negative for fever, malaise/fatigue and weight loss.  HENT: Negative for congestion and sinus pain.   Respiratory: Negative.  Negative for cough and shortness of breath.   Cardiovascular: Negative.  Negative for chest pain and leg swelling.  Gastrointestinal: Negative for abdominal pain, constipation, diarrhea, nausea and vomiting.  Genitourinary: Negative.  Negative for dysuria.  Musculoskeletal: Negative.  Negative for joint pain.  Skin: Negative.  Negative for rash.  Neurological: Negative.  Negative for tingling, sensory change and weakness.  Psychiatric/Behavioral: Negative.  Negative for depression. The patient is not nervous/anxious and does not have  insomnia.     As per HPI. Otherwise, a complete review of systems is negative.  PAST MEDICAL HISTORY: Past Medical History:  Diagnosis Date  . Anxiety   . Cancer ([place]) 04/24/2016   right breast.  chemo finished 09/2016  . HSV infection    HISTORY OF HSV  . Personal history of chemotherapy   . Personal history of radiation therapy   . PONV (postoperative nausea and vomiting)    nausea no vomiting  . Post-menopausal 10/20/2005  . Postmenopause bleeding   . Thickened endometrium 05/30/2016    PAST SURGICAL HISTORY: Past Surgical History:  Procedure Laterality Date  . ANAL FISTULECTOMY    . BREAST LUMPECTOMY Right 11/30/2016  . ENDOMETRIAL BIOPSY    . KNEE SURGERY     snow skating  . PARTIAL MASTECTOMY WITH NEEDLE LOCALIZATION Right 11/30/2016   Procedure: PARTIAL MASTECTOMY WITH NEEDLE LOCALIZATION;  Surgeon: Leonie Green, MD;  Location: [facility] ORS;  Service: General;  Laterality: Right;  . PORTACATH PLACEMENT N/A 05/23/2016   Procedure: INSERTION PORT-A-CATH;  Surgeon: Leonie Green, MD;  Location: [facility] ORS;  Service: General;  Laterality: N/A;  . SENTINEL NODE BIOPSY Right 11/30/2016   Procedure: SENTINEL NODE BIOPSY;  Surgeon: Leonie Green, MD;  Location: [facility] ORS;  Service: General;  Laterality: Right;    FAMILY HISTORY: Family History  Problem Relation Age of Onset  . Cancer Mother        BREAST AND BONES  . Breast cancer Mother 36  . Emphysema Father   . Cancer Father   . Cancer Maternal Aunt     ADVANCED DIRECTIVES (Y/N):  N  HEALTH MAINTENANCE: Social History   Tobacco Use  . Smoking status: Never Smoker  . Smokeless tobacco: Never Used  Substance Use Topics  . Alcohol use: Yes    Alcohol/week: 6.0 - 8.0 standard drinks    Types: 3 - 4 Glasses of wine, 3 - 4 Cans of beer per week    Comment: WEEKENDS OCC  . Drug use: No    Types: Marijuana    Comment: In early 22     Colonoscopy:  PAP:  Bone density:  Lipid panel:  Allergies    Allergen Reactions  . Penicillins Anaphylaxis    unknown    Current Outpatient Medications  Medication Sig Dispense Refill  . ALPRAZolam (XANAX) 0.25 MG tablet Take 0.5 tablets (0.125 mg total) by mouth 2 (two) times daily as needed for anxiety. 30 tablet 0  . anastrozole (ARIMIDEX) 1 MG tablet Take 1 tablet (1 mg total) by mouth daily. 90 tablet 0  . BIOTIN PO Take 1 tablet by mouth daily.     . calcium citrate-vitamin D (CITRACAL+D) 315-200 MG-UNIT tablet Take by mouth.    . docusate sodium (COLACE) 100 MG capsule Take 200 mg by mouth at bedtime.    . Melatonin 10 MG TABS Take 10 mg by mouth at bedtime. For sleep    . Multiple Vitamins-Minerals (MULTIVITAMIN ADULTS 50+) TABS Take 1 tablet by mouth daily.    . polycarbophil (FIBERCON) 625 MG tablet Take 1,250 mg by mouth at bedtime.      No current facility-administered medications for this visit.     OBJECTIVE: Vitals:   02/27/18 1429  BP: 117/81  Pulse: 91  Resp: 18  Temp: 98.8 F (37.1 C)     Body mass index is 25.58 kg/m.    ECOG FS:0 - Asymptomatic  General: Well-developed, well-nourished, no acute distress. Eyes: Pink conjunctiva, anicteric sclera. HEENT: Normocephalic, moist mucous membranes. Breast: Patient requested exam be deferred today. Lungs: Clear to auscultation bilaterally. Heart: Regular rate and rhythm. No rubs, murmurs, or gallops. Abdomen: Soft, nontender, nondistended. No organomegaly noted, normoactive bowel sounds. Musculoskeletal: No edema, cyanosis, or clubbing. Neuro: Alert, answering all questions appropriately. Cranial nerves grossly intact. Skin: No rashes or petechiae noted. Psych: Normal affect.  LAB RESULTS:  Lab Results  Component Value Date   NA 137 10/12/2016   K 3.9 10/12/2016   CL 108 10/12/2016   CO2 26 10/12/2016   GLUCOSE 99 10/12/2016   BUN 14 10/12/2016   CREATININE 0.68 10/12/2016   CALCIUM 9.0 10/12/2016   PROT 6.9 10/12/2016   ALBUMIN 4.0 10/12/2016   AST 30  10/12/2016   ALT 28 10/12/2016   ALKPHOS 75 10/12/2016   BILITOT 0.4 10/12/2016   GFRNONAA >60 10/12/2016   GFRAA >60 10/12/2016    Lab Results  Component Value Date   WBC 4.6 10/12/2016   NEUTROABS 2.9 10/12/2016   HGB 11.8 (L) 10/12/2016   HCT 34.1 (L) 10/12/2016   MCV 90.2 10/12/2016   PLT 298 10/12/2016     STUDIES: US Pelvis Transvanginal Non-ob (tv Only)  Result Date: 02/19/2018 CLINICAL DATA:  Initial evaluation for thickened endometrial stripe. EXAM: TRANSABDOMINAL AND TRANSVAGINAL ULTRASOUND OF PELVIS TECHNIQUE: Both transabdominal and transvaginal ultrasound examinations of the pelvis were performed. Transabdominal technique was performed for global imaging of the pelvis including uterus, ovaries, adnexal regions, and pelvic cul-de-sac. It was necessary to proceed with endovaginal exam following the transabdominal exam to visualize the uterus, endometrium, and ovaries. COMPARISON:  None FINDINGS: Uterus Measurements: 5.5 x 2.0 x 3.2 cm. No fibroids or other mass visualized. Endometrium Thickness: 10.3 mm. Endometrial complex demonstrates a heterogeneous  echotexture with multifocal cystic changes. No discrete lesion or associated vascularity. Right ovary Measurements: 2.7 x 2.0 x 2.1 cm. 2.6 x 1.9 x 2.3 cm simple cyst seen arising from the right ovary. No internal complexity or vascularity. Left ovary Not visualized.  No adnexal mass. Other findings No abnormal free fluid. IMPRESSION: 1. Thickened endometrial stripe measuring up to 10.3 mm with multi cystic appearance. Endometrial thickness is considered abnormal for an asymptomatic post-menopausal female. Endometrial sampling should be considered to exclude carcinoma. 2. 2.6 cm simple right ovarian cyst. While this is almost certainly benign, a follow up ultrasound is recommended in 1 year according to the Society of Radiologists in [place] Statement (D Clovis Riley et al. Management of Asymptomatic Ovarian and  Other Adnexal Cysts Imaged at [place]: Society of Radiologists in [place] Statement 2010. Radiology 256 (Sept 2010): 943-954.). 3. Nonvisualization of the left ovary.  No adnexal mass. Electronically Signed   By: Jeannine Boga M.D.   On: 02/19/2018 16:58   US Pelvis Complete  Result Date: 02/19/2018 CLINICAL DATA:  Initial evaluation for thickened endometrial stripe. EXAM: TRANSABDOMINAL AND TRANSVAGINAL ULTRASOUND OF PELVIS TECHNIQUE: Both transabdominal and transvaginal ultrasound examinations of the pelvis were performed. Transabdominal technique was performed for global imaging of the pelvis including uterus, ovaries, adnexal regions, and pelvic cul-de-sac. It was necessary to proceed with endovaginal exam following the transabdominal exam to visualize the uterus, endometrium, and ovaries. COMPARISON:  None FINDINGS: Uterus Measurements: 5.5 x 2.0 x 3.2 cm. No fibroids or other mass visualized. Endometrium Thickness: 10.3 mm. Endometrial complex demonstrates a heterogeneous echotexture with multifocal cystic changes. No discrete lesion or associated vascularity. Right ovary Measurements: 2.7 x 2.0 x 2.1 cm. 2.6 x 1.9 x 2.3 cm simple cyst seen arising from the right ovary. No internal complexity or vascularity. Left ovary Not visualized.  No adnexal mass. Other findings No abnormal free fluid. IMPRESSION: 1. Thickened endometrial stripe measuring up to 10.3 mm with multi cystic appearance. Endometrial thickness is considered abnormal for an asymptomatic post-menopausal female. Endometrial sampling should be considered to exclude carcinoma. 2. 2.6 cm simple right ovarian cyst. While this is almost certainly benign, a follow up ultrasound is recommended in 1 year according to the Society of Radiologists in [place] Statement (D Clovis Riley et al. Management of Asymptomatic Ovarian and Other Adnexal Cysts Imaged at [place]: Society of Radiologists in [place] Statement 2010. Radiology 256 (Sept 2010): 943-954.). 3. Nonvisualization of the left ovary.  No adnexal mass. Electronically Signed   By: Jeannine Boga M.D.   On: 02/19/2018 16:58    ASSESSMENT: Pathologic stage IIa ER/PR positive, HER-2 negative invasive carcinoma of the upper outer quadrant of the right breast.  PLAN:    1. Pathologic stage IIa ER/PR positive, HER-2 negative invasive carcinoma of the upper outer quadrant of the right breast: Patient completed her neoadjuvant chemotherapy on Oct 12, 2016. Her lumpectomy was done on November 30, 2016. Patient completed her XRT at [facility] in approximately October 2018.  She could not tolerate letrozole and was switched to anastrozole.  Continue treatment for a total of 5 years completing in October 2023.  Patient's most recent mammogram on January 15, 2018 was reported as BI-RADS 2.  Repeat in August 2020.  Return to clinic in 6 months for routine evaluation. 2. Endometrial thickening: Patient had recent ultrasound and evaluation by gynecology oncology.  No further intervention is needed.  Continue routine Pap smears as instructed.   3. Peripheral  neuropathy: Patient does not complain of this today. 4.  Osteopenia:  Patient had a bone mineral density on June 25, 2017 that reported a T score of -1.6.  Continue calcium and vitamin D supplementation.  Repeat in February 2020.  Patient expressed understanding and was in agreement with this plan. She also understands that She can call clinic at any time with any questions, concerns, or complaints.   Cancer Staging Malignant neoplasm of upper-outer quadrant of female breast [facility]) Staging form: Breast, AJCC 7th Edition - Clinical stage from 04/26/2016: Stage IIA (T1c, N1, M0) - Signed by Lloyd Huger, MD on 05/25/2016 - Pathologic stage from 12/15/2016: Stage IIA (yT1c, N1a, cM0) - Signed by Lloyd Huger, MD on 12/15/2016    Lloyd Huger, MD 03/01/18 1:22 PM

## 2018-02-27 ENCOUNTER — Encounter: Payer: Self-pay | Admitting: Oncology

## 2018-02-27 ENCOUNTER — Inpatient Hospital Stay: Payer: 59

## 2018-02-27 ENCOUNTER — Inpatient Hospital Stay (HOSPITAL_BASED_OUTPATIENT_CLINIC_OR_DEPARTMENT_OTHER): Payer: 59 | Admitting: Oncology

## 2018-02-27 VITALS — BP 117/81 | HR 91 | Temp 98.8°F | Resp 18 | Wt 172.6 lb

## 2018-02-27 DIAGNOSIS — Z79811 Long term (current) use of aromatase inhibitors: Secondary | ICD-10-CM | POA: Diagnosis not present

## 2018-02-27 DIAGNOSIS — Z95828 Presence of other vascular implants and grafts: Secondary | ICD-10-CM

## 2018-02-27 DIAGNOSIS — Z170 Estrogen receptor positive status [ER+]: Secondary | ICD-10-CM

## 2018-02-27 DIAGNOSIS — Z801 Family history of malignant neoplasm of trachea, bronchus and lung: Secondary | ICD-10-CM

## 2018-02-27 DIAGNOSIS — R9389 Abnormal findings on diagnostic imaging of other specified body structures: Secondary | ICD-10-CM

## 2018-02-27 DIAGNOSIS — C50411 Malignant neoplasm of upper-outer quadrant of right female breast: Secondary | ICD-10-CM

## 2018-02-27 DIAGNOSIS — N841 Polyp of cervix uteri: Secondary | ICD-10-CM

## 2018-02-27 DIAGNOSIS — Z79899 Other long term (current) drug therapy: Secondary | ICD-10-CM

## 2018-02-27 DIAGNOSIS — Z923 Personal history of irradiation: Secondary | ICD-10-CM

## 2018-02-27 DIAGNOSIS — M8580 Other specified disorders of bone density and structure, unspecified site: Secondary | ICD-10-CM

## 2018-02-27 DIAGNOSIS — F419 Anxiety disorder, unspecified: Secondary | ICD-10-CM

## 2018-02-27 DIAGNOSIS — Z9221 Personal history of antineoplastic chemotherapy: Secondary | ICD-10-CM

## 2018-02-27 DIAGNOSIS — N83291 Other ovarian cyst, right side: Secondary | ICD-10-CM

## 2018-02-27 MED ORDER — HEPARIN SOD (PORK) LOCK FLUSH 100 UNIT/ML IV SOLN: 500.0000 [IU] | Freq: Once | INTRAVENOUS | Status: DC

## 2018-02-27 MED ORDER — SODIUM CHLORIDE 0.9% FLUSH
10.0000 mL | Freq: Once | INTRAVENOUS | Status: DC
  Filled 2018-02-27: qty 10

## 2018-02-27 NOTE — Progress Notes (Signed)
Pt in for 6 month follow up, had mammogram in August.  Pt had port removed 4 months ago.

## 2018-03-03 ENCOUNTER — Other Ambulatory Visit: Payer: Self-pay | Admitting: Oncology

## 2018-05-17 ENCOUNTER — Encounter: Payer: Self-pay | Admitting: Internal Medicine

## 2018-05-17 ENCOUNTER — Ambulatory Visit: Payer: 59 | Admitting: Internal Medicine

## 2018-05-17 VITALS — BP 138/88 | HR 84 | Temp 97.7°F | Ht 65.75 in | Wt 177.0 lb

## 2018-05-17 DIAGNOSIS — J383 Other diseases of vocal cords: Secondary | ICD-10-CM | POA: Diagnosis not present

## 2018-05-17 DIAGNOSIS — F419 Anxiety disorder, unspecified: Secondary | ICD-10-CM

## 2018-05-17 DIAGNOSIS — Z13220 Encounter for screening for lipoid disorders: Secondary | ICD-10-CM

## 2018-05-17 DIAGNOSIS — R5383 Other fatigue: Secondary | ICD-10-CM

## 2018-05-17 DIAGNOSIS — H9319 Tinnitus, unspecified ear: Secondary | ICD-10-CM

## 2018-05-17 DIAGNOSIS — G4700 Insomnia, unspecified: Secondary | ICD-10-CM

## 2018-05-17 DIAGNOSIS — C50411 Malignant neoplasm of upper-outer quadrant of right female breast: Secondary | ICD-10-CM | POA: Diagnosis not present

## 2018-05-17 DIAGNOSIS — M79671 Pain in right foot: Secondary | ICD-10-CM

## 2018-05-17 DIAGNOSIS — E559 Vitamin D deficiency, unspecified: Secondary | ICD-10-CM

## 2018-05-17 DIAGNOSIS — Z1329 Encounter for screening for other suspected endocrine disorder: Secondary | ICD-10-CM

## 2018-05-17 DIAGNOSIS — Z170 Estrogen receptor positive status [ER+]: Secondary | ICD-10-CM

## 2018-05-17 DIAGNOSIS — Z1389 Encounter for screening for other disorder: Secondary | ICD-10-CM

## 2018-05-17 DIAGNOSIS — E611 Iron deficiency: Secondary | ICD-10-CM

## 2018-05-17 MED ORDER — ALPRAZOLAM 0.25 MG PO TABS: 0.1250 mg | ORAL_TABLET | Freq: Two times a day (BID) | ORAL | 2 refills | Status: DC | PRN

## 2018-05-17 MED ORDER — TRAZODONE HCL 50 MG PO TABS: 25.0000 mg | ORAL_TABLET | Freq: Every evening | ORAL | 2 refills | Status: DC | PRN

## 2018-05-17 NOTE — Progress Notes (Signed)
Chief Complaint  Patient presents with  . Establish Care   New patient.  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 She dx'ed herself but has never seen a specialist. 2. tinnitis ears with hearing loss she wants her ears checked  3. Fatigue, weight gain of 20 lbs, hair loss and hot flashes may be menopausal related LMP years ago  4. Right foot plantar fascitis 6/10 x < 1 year tried rollers to help nothing else 5. Anxiety/insomnia. She has always been a night owl gets 5 hrs of sleep  6. C/o neuropathy from chemotherapy sx's in thumbs and nails are musty smell raised and discolored disc'ed dermatology in future she wants to know what tod o    Review of Systems  Constitutional: Positive for malaise/fatigue. Negative for weight loss.  HENT: Positive for hearing loss and tinnitus.        Voice changes    Eyes: Negative for blurred vision.  Respiratory: Negative for shortness of breath.   Cardiovascular: Negative for chest pain.  Gastrointestinal: Negative for abdominal pain.  Musculoskeletal: Negative for falls.  Skin:       +nail discoloration    Neurological: Positive for sensory change.  Psychiatric/Behavioral: The patient is nervous/anxious and has insomnia.    Past Medical History:  Diagnosis Date  . Anxiety   . Cancer ([place]) 04/24/2016   right breast.  chemo finished 09/2016  . HSV infection    HISTORY OF HSV  . Personal history of chemotherapy   . Personal history of radiation therapy   . PONV (postoperative nausea and vomiting)    nausea no vomiting  . Post-menopausal 10/20/2005  . Postmenopause bleeding   . Spasmodic dysphonia   . Thickened endometrium 05/30/2016   Past Surgical History:  Procedure Laterality Date  . ANAL FISTULECTOMY    . BREAST LUMPECTOMY Right 11/30/2016  . ENDOMETRIAL BIOPSY    . KNEE SURGERY     snow skating  . PARTIAL MASTECTOMY  WITH NEEDLE LOCALIZATION Right 11/30/2016   Procedure: PARTIAL MASTECTOMY WITH NEEDLE LOCALIZATION;  Surgeon: Leonie Green, MD;  Location: [facility];  Service: General;  Laterality: Right;  . PORTACATH PLACEMENT N/A 05/23/2016   Procedure: INSERTION PORT-A-CATH;  Surgeon: Leonie Green, MD;  Location: [facility];  Service: General;  Laterality: N/A;  . removal of port a cath     01/2018  . SENTINEL NODE BIOPSY Right 11/30/2016   Procedure: SENTINEL NODE BIOPSY;  Surgeon: Leonie Green, MD;  Location: [facility];  Service: General;  Laterality: Right;   Family History  Problem Relation Age of Onset  . Cancer Mother        BREAST AND BONES  . Breast cancer Mother 30  . Hearing loss Mother   . Emphysema Father   . Cancer Father   . Alcohol abuse Father   . Cancer Maternal Aunt   . Hearing loss Maternal Grandmother   . Hypertension Sister   . Miscarriages / [place] Sister    Social History   Socioeconomic History  . Marital status: Widowed    Spouse name: Not on file  . Number of children: Not on file  . Years of education: Not on file  . Highest education level: Not on file  Occupational History  . Not on file  Social Needs  . Financial resource strain: Not on file  .  Food insecurity:    Worry: Not on file    Inability: Not on file  . Transportation needs:    Medical: Not on file    Non-medical: Not on file  Tobacco Use  . Smoking status: Never Smoker  . Smokeless tobacco: Never Used  Substance and Sexual Activity  . Alcohol use: Yes    Alcohol/week: 6.0 - 8.0 standard drinks    Types: 3 - 4 Glasses of wine, 3 - 4 Cans of beer per week    Comment: WEEKENDS OCC  . Drug use: No    Types: Marijuana    Comment: In early 1  . Sexual activity: Yes    Partners: Male    Birth control/protection: Post-menopausal  Lifestyle  . Physical activity:    Days per week: Not on file    Minutes per session: Not on file  . Stress: Not on file  Relationships  .  Social connections:    Talks on phone: Not on file    Gets together: Not on file    Attends religious service: Not on file    Active member of club or organization: Not on file    Attends meetings of clubs or organizations: Not on file    Relationship status: Not on file  . Intimate partner violence:    Fear of current or ex partner: Not on file    Emotionally abused: Not on file    Physically abused: Not on file    Forced sexual activity: Not on file  Other Topics Concern  . 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    No kids    Never smoker   Owns guns, wears seat belt, safe in relationship    Current Meds  Medication Sig  . ALPRAZolam (XANAX) 0.25 MG tablet Take 0.5 tablets (0.125 mg total) by mouth 2 (two) times daily as needed for anxiety.  Marland Kitchen anastrozole (ARIMIDEX) 1 MG tablet TAKE 1 TABLET (1 MG TOTAL) BY MOUTH DAILY.  Marland Kitchen BIOTIN PO Take 1 tablet by mouth daily.   . calcium citrate-vitamin D (CITRACAL+D) 315-200 MG-UNIT tablet Take by mouth.  . docusate sodium (COLACE) 100 MG capsule Take 200 mg by mouth at bedtime.  . Melatonin 10 MG TABS Take 10 mg by mouth at bedtime. For sleep  . Multiple Vitamins-Minerals (MULTIVITAMIN ADULTS 50+) TABS Take 1 tablet by mouth daily.  . polycarbophil (FIBERCON) 625 MG tablet Take 1,250 mg by mouth at bedtime.   . [DISCONTINUED] ALPRAZolam (XANAX) 0.25 MG tablet Take 0.5 tablets (0.125 mg total) by mouth 2 (two) times daily as needed for anxiety.   Allergies  Allergen Reactions  . Penicillins Anaphylaxis    Unknown; per pt rxn as kid ? Reaction     No results found for this or any previous visit (from the past 2158/09/12 hour(s)). Objective  Body mass index is 28.79 kg/m. Wt Readings from Last 3 Encounters:  05/17/18 177 lb (80.3 kg)  02/27/18 172 lb 9 oz (78.3 kg)  02/20/18 173 lb  4.8 oz (78.6 kg)   Temp Readings from Last 3 Encounters:  05/17/18 97.7 F (36.5 C) (Oral)  02/27/18 98.8 F (37.1 C) (Tympanic)  02/20/18 98.7 F (37.1 C) (Tympanic)   BP Readings from Last 3 Encounters:  05/17/18 138/88  02/27/18 117/81  02/20/18  120/78   Pulse Readings from Last 3 Encounters:  05/17/18 84  02/27/18 91  02/20/18 (!) 102    Physical Exam Vitals signs and nursing note reviewed.  Constitutional:      Appearance: Normal appearance. She is well-developed.  HENT:     Head: Normocephalic and atraumatic.     Nose: Nose normal.     Mouth/Throat:     Mouth: Mucous membranes are moist.     Pharynx: Oropharynx is clear.  Eyes:     Conjunctiva/sclera: Conjunctivae normal.     Pupils: Pupils are equal, round, and reactive to light.  Cardiovascular:     Rate and Rhythm: Normal rate and regular rhythm.     Heart sounds: Normal heart sounds.  Pulmonary:     Effort: Pulmonary effort is normal.     Breath sounds: Normal breath sounds.  Skin:    General: Skin is warm and dry.  Neurological:     General: No focal deficit present.     Mental Status: She is alert and oriented to person, place, and time.     Gait: Gait normal.  Psychiatric:        Attention and Perception: Attention and perception normal.        Mood and Affect: Mood and affect normal.        Speech: Speech normal.        Behavior: Behavior normal. Behavior is cooperative.        Thought Content: Thought content normal.        Cognition and Memory: Cognition and memory normal.        Judgment: Judgment normal.   mild fluid in b/l ears   Assessment   1. Spasmodic dysphonia  2. tinnitis ears clear of wax ? ETD with hearing loss 3. Fatigue with weight gain of 20 lbs, hair loss and hot flashes may be menopausal related  4. Likely Right foot plantar fascitis  5. Anxiety/insomnia. She has always been a night owl gets 5 hrs of sleep  6. HM Plan  1 and 2. Consider ENT vs neurology in future  Disc  Botox and tx for spasmodic dysphonia  Given letter stating she has spasmodic dysphonia she also notes her brother has similar sx's.  3. Check labs upcoming  4.  Consider podiatry in future Disc arches, exercise, ice, stretching 5.  She does not want to try ambien.  Had tried melatonin 10 mg qhs  Refilled xanax   6.  Denies flu shot  Tdap disc'ed today Consider shingrix vaccine in future   Mammogram 01/15/18 negative diagnostic with h/o right breast cancer invasive mammo DCIS 2/3 lymphadenopathy 1 year in remission and 01/2018 port removed.  Pap Dr. Kennon Rounds 03/23/15 neg, neg HPV Colonoscopy ?2015 need to get records h/o polyp due in 10 years need to get report  DEXA 06/25/17 osteopenia -1.6  Consider dermatology in future for nail changes   H/O-Dr. Grayland Ormond  Eye-[facility]  Dentist-Dr. Vice   Provider: Dr. Olivia Mackie McLean-Scocuzza-Internal Medicine

## 2018-05-17 NOTE — Progress Notes (Signed)
Pre visit review using our clinic review tool, if applicable. No additional management support is needed unless otherwise documented below in the visit note. 

## 2018-05-17 NOTE — Patient Instructions (Addendum)
Try ice or Dr. Zoe Lan inserts  Advil or Tori Milks as needed  Foot stretches ([facility] or Justin Mend MD)   Debrox for ear cleaning 4-7 days 1x per month  lamisil or lotrimen otc antifungal creams  -Dr Kellie Moor (dermatology)   Calcium 600 mg 1-2 x per day  Vitamin D 3 2000 IU daily   L theanine supplement for sleep [employer]  Warm milk    Flonase otc with allergy at night otc zyrtec, allegra or claritin   Tinnitus-consider ENT Tinnitus refers to hearing a sound when there is no actual source for that sound. This is often described as ringing in the ears. However, people with this condition may hear a variety of noises, in one ear or in both ears. The sounds of tinnitus can be soft, loud, or somewhere in between. Tinnitus can last for a few seconds or can be constant for days. It may go away without treatment and come back at various times. When tinnitus is constant or happens often, it can lead to other problems, such as trouble sleeping and trouble concentrating. Almost everyone experiences tinnitus at some point. Tinnitus that is long-lasting (chronic) or comes back often (recurs) may require medical attention. What are the causes? The cause of tinnitus is often not known. In some cases, it can result from other problems or conditions, including:  Exposure to loud noises from machinery, music, or other sources.  Hearing loss.  Ear or sinus infections.  Earwax buildup.  An object (foreign body) stuck in the ear.  Taking certain medicines.  Drinking alcohol or caffeine.  High blood pressure.  Heart diseases.  Anemia.  Allergies.  Meniere's disease.  Thyroid problems.  Tumors.  A weak, bulging blood vessel (aneurysm) near the ear.  Depression or other mood disorders. What are the signs or symptoms? The main symptom of tinnitus is hearing a sound when there is no source for that sound. It may sound like:  Buzzing.  Roaring.  Ringing.  Blowing air, like the  sound heard when you listen to a seashell.  Hissing.  Whistling.  Sizzling.  Humming.  Running water.  A musical note.  Tapping. Symptoms may affect only one ear (unilateral) or both ears (bilateral). How is this diagnosed? Tinnitus is diagnosed based on your symptoms, your medical history, and a physical exam. Your health care provider may do a thorough hearing test (audiologic exam) if your tinnitus:  Is unilateral.  Causes hearing difficulties.  Lasts 6 months or longer. You may work with a health care provider who specializes in hearing disorders (audiologist). You may be asked questions about your symptoms and how they affect your daily life. You may have other tests done, such as:  CT scan.  MRI.  An imaging test of how blood flows through your blood vessels (angiogram). How is this treated? Treating an underlying medical condition can sometimes make tinnitus go away. If your tinnitus continues, other treatments may include:  Medicines, such as antidepressants or sleeping aids.  Sound generators to mask the tinnitus. These include: ? Tabletop sound machines that play relaxing sounds to help you fall asleep. ? Wearable devices that fit in your ear and play sounds or music. ? Acoustic neural stimulation. This involves using headphones to listen to music that contains an auditory signal. Over time, listening to this signal may change some pathways in your brain and make you less sensitive to tinnitus. This treatment is used for very severe cases when no other treatment is working.  Therapy and counseling to help you manage the stress of living with tinnitus.  Using hearing aids or cochlear implants if your tinnitus is related to hearing loss. Hearing aids are worn in the outer ear. Cochlear implants are surgically placed in the inner ear. Follow these instructions at home: Managing symptoms      When possible, avoid being in loud places and being exposed to loud  sounds.  Wear hearing protection, such as earplugs, when you are exposed to loud noises.  Use a white noise machine, a humidifier, or other devices to mask the sound of tinnitus.  Practice techniques for reducing stress, such as meditation, yoga, or deep breathing. Work with your health care provider if you need help with managing stress.  Sleep with your head slightly raised. This may reduce the impact of tinnitus. General instructions  Do not use stimulants, such as nicotine, alcohol, or caffeine. Talk with your health care provider about other stimulants to avoid. Stimulants are substances that can make you feel alert and attentive by increasing certain activities in the body (such as heart rate and blood pressure). These substances may make tinnitus worse.  Take over-the-counter and prescription medicines only as told by your health care provider.  Try to get plenty of sleep each night.  Keep all follow-up visits as told by your health care provider. This is important. Contact a health care provider if:  Your tinnitus continues for 3 weeks or longer without stopping.  Your symptoms get worse or do not get better with home care.  You develop tinnitus after a head injury.  You have tinnitus along with any of the following: ? Dizziness. ? Loss of balance. ? Nausea and vomiting. Summary  Tinnitus refers to hearing a sound when there is no actual source for that sound. This is often described as ringing in the ears.  Symptoms may affect only one ear (unilateral) or both ears (bilateral).  Use a white noise machine, a humidifier, or other devices to mask the sound of tinnitus.  Do not use stimulants, such as nicotine, alcohol, or caffeine. Talk with your health care provider about other stimulants to avoid. These substances may make tinnitus worse. This information is not intended to replace advice given to you by your health care provider. Make sure you discuss any questions you  have with your health care provider. Document Released: 05/08/2005 Document Revised: 02/15/2017 Document Reviewed: 02/15/2017 Elsevier Interactive Patient Education  2019 [employer].    Tdap/Tdap Vaccine (Tetanus, Diphtheria and Pertussis): What You Need to Know 1. Why get vaccinated? Tetanus, diphtheria and pertussis are very serious diseases. Tdap vaccine can protect [place] from these diseases. And, Tdap vaccine given to pregnant women can protect newborn babies against pertussis.Marland Kitchen TETANUS (Lockjaw) is rare in the [place] today. It causes painful muscle tightening and stiffness, usually all over the body.  It can lead to tightening of muscles in the head and neck so you can't open your mouth, swallow, or sometimes even breathe. Tetanus kills about 1 out of 10 people who are infected even after receiving the best medical care. DIPHTHERIA is also rare in the [place] today. It can cause a thick coating to form in the back of the throat.  It can lead to breathing problems, heart failure, paralysis, and death. PERTUSSIS (Whooping Cough) causes severe coughing spells, which can cause difficulty breathing, vomiting and disturbed sleep.  It can also lead to weight loss, incontinence, and rib fractures. Up to 2 in  100 adolescents and 5 in 100 adults with pertussis are hospitalized or have complications, which could include pneumonia or death. These diseases are caused by bacteria. Diphtheria and pertussis are spread from person to person through secretions from coughing or sneezing. Tetanus enters the body through cuts, scratches, or wounds. Before vaccines, as many as 200,000 cases of diphtheria, 200,000 cases of pertussis, and hundreds of cases of tetanus, were reported in the [place] each year. Since vaccination began, reports of cases for tetanus and diphtheria have dropped by about 99% and for pertussis by about 80%. 2. Tdap vaccine Tdap vaccine can protect adolescents and adults  from tetanus, diphtheria, and pertussis. One dose of Tdap is routinely given at age 48 or 32. People who did not get Tdap at that age should get it as soon as possible. Tdap is especially important for healthcare professionals and anyone having close contact with a baby younger than 12 months. Pregnant women should get a dose of Tdap during every pregnancy, to protect the newborn from pertussis. Infants are most at risk for severe, life-threatening complications from pertussis. Another vaccine, called Td, protects against tetanus and diphtheria, but not pertussis. A Td booster should be given every 10 years. Tdap may be given as one of these boosters if you have never gotten Tdap before. Tdap may also be given after a severe cut or burn to prevent tetanus infection. Your doctor or the person giving you the vaccine can give you more information. Tdap may safely be given at the same time as other vaccines. 3. Some people should not get this vaccine  A person who has ever had a life-threatening allergic reaction after a previous dose of any diphtheria, tetanus or pertussis containing vaccine, OR has a severe allergy to any part of this vaccine, should not get Tdap vaccine. Tell the person giving the vaccine about any severe allergies.  Anyone who had coma or long repeated seizures within 7 days after a childhood dose of DTP or DTaP, or a previous dose of Tdap, should not get Tdap, unless a cause other than the vaccine was found. They can still get Td.  Talk to your doctor if you: ? have seizures or another nervous system problem, ? had severe pain or swelling after any vaccine containing diphtheria, tetanus or pertussis, ? ever had a condition called Guillain-Barr Syndrome (GBS), ? aren't feeling well on the day the shot is scheduled. 4. Risks With any medicine, including vaccines, there is a chance of side effects. These are usually mild and go away on their own. Serious reactions are also possible  but are rare. Most people who get Tdap vaccine do not have any problems with it. Mild problems following Tdap (Did not interfere with activities)  Pain where the shot was given (about 3 in 4 adolescents or 2 in 3 adults)  Redness or swelling where the shot was given (about 1 person in 5)  Mild fever of at least 100.61F (up to about 1 in 25 adolescents or 1 in 100 adults)  Headache (about 3 or 4 people in 10)  Tiredness (about 1 person in 3 or 4)  Nausea, vomiting, diarrhea, stomach ache (up to 1 in 4 adolescents or 1 in 10 adults)  Chills, sore joints (about 1 person in 10)  Body aches (about 1 person in 3 or 4)  Rash, swollen glands (uncommon) Moderate problems following Tdap (Interfered with activities, but did not require medical attention)  Pain where the shot was  given (up to 1 in 5 or 6)  Redness or swelling where the shot was given (up to about 1 in 16 adolescents or 1 in 12 adults)  Fever over 102F (about 1 in 100 adolescents or 1 in 250 adults)  Headache (about 1 in 7 adolescents or 1 in 10 adults)  Nausea, vomiting, diarrhea, stomach ache (up to 1 or 3 people in 100)  Swelling of the entire arm where the shot was given (up to about 1 in 500). Severe problems following Tdap (Unable to perform usual activities; required medical attention)  Swelling, severe pain, bleeding and redness in the arm where the shot was given (rare). Problems that could happen after any vaccine:  People sometimes faint after a medical procedure, including vaccination. Sitting or lying down for about 15 minutes can help prevent fainting, and injuries caused by a fall. Tell your doctor if you feel dizzy, or have vision changes or ringing in the ears.  Some people get severe pain in the shoulder and have difficulty moving the arm where a shot was given. This happens very rarely.  Any medication can cause a severe allergic reaction. Such reactions from a vaccine are very rare, estimated at  fewer than 1 in a million doses, and would happen within a few minutes to a few hours after the vaccination. As with any medicine, there is a very remote chance of a vaccine causing a serious injury or death. The safety of vaccines is always being monitored. For more information, visit: http://www.aguilar.org/ 5. What if there is a serious problem? What should I look for?  Look for anything that concerns you, such as signs of a severe allergic reaction, very high fever, or unusual behavior. Signs of a severe allergic reaction can include hives, swelling of the face and throat, difficulty breathing, a fast heartbeat, dizziness, and weakness. These would usually start a few minutes to a few hours after the vaccination. What should I do?  If you think it is a severe allergic reaction or other emergency that can't wait, call 9-1-1 or get the person to the nearest hospital. Otherwise, call your doctor.  Afterward, the reaction should be reported to the Vaccine Adverse Event Reporting System (VAERS). Your doctor might file this report, or you can do it yourself through the VAERS web site at www.vaers.SamedayNews.es, or by calling 787-602-9050. VAERS does not give medical advice. 6. The National Vaccine Injury Compensation Program The [employer] Vaccine Injury Compensation Program (VICP) is a federal program that was created to compensate people who may have been injured by certain vaccines. Persons who believe they may have been injured by a vaccine can learn about the program and about filing a claim by calling 504-221-1225 or visiting the [employer] website at GoldCloset.com.ee. There is a time limit to file a claim for compensation. 7. How can I learn more?  Ask your doctor. He or she can give you the vaccine package insert or suggest other sources of information.  Call your local or state health department.  Contact the Centers for Disease Control and Prevention (CDC): ? Call  (463)569-6884 (1-800-CDC-INFO) or ? Visit CDC's website at http://hunter.com/ Vaccine Information Statement Tdap Vaccine (07/15/2013) This information is not intended to replace advice given to you by your health care provider. Make sure you discuss any questions you have with your health care provider. Document Released: 11/07/2011 Document Revised: 12/24/2017 Document Reviewed: 12/24/2017 Elsevier Interactive Patient Education  2019 Else[employer] Inc.   Plantar Fasciitis  Plantar fasciitis  is a painful foot condition that affects the heel. It occurs when the band of tissue that connects the toes to the heel bone (plantar fascia) becomes irritated. This can happen as the result of exercising too much or doing other repetitive activities (overuse injury). The pain from plantar fasciitis can range from mild irritation to severe pain that makes it difficult to walk or move. The pain is usually worse in the morning after sleeping, or after sitting or lying down for a while. Pain may also be worse after long periods of walking or standing. What are the causes? This condition may be caused by:  Standing for long periods of time.  Wearing shoes that do not have good arch support.  Doing activities that put stress on joints (high-impact activities), including running, aerobics, and ballet.  Being overweight.  An abnormal way of walking (gait).  Tight muscles in the back of your lower leg (calf).  High arches in your feet.  Starting a new athletic activity. What are the signs or symptoms? The main symptom of this condition is heel pain. Pain may:  Be worse with first steps after a time of rest, especially in the morning after sleeping or after you have been sitting or lying down for a while.  Be worse after long periods of standing still.  Decrease after 30-45 minutes of activity, such as gentle walking. How is this diagnosed? This condition may be diagnosed based on your medical history  and your symptoms. Your health care provider may ask questions about your activity level. Your health care provider will do a physical exam to check for:  A tender area on the bottom of your foot.  A high arch in your foot.  Pain when you move your foot.  Difficulty moving your foot. You may have imaging tests to confirm the diagnosis, such as:  X-rays.  Ultrasound.  MRI. How is this treated? Treatment for plantar fasciitis depends on how severe your condition is. Treatment may include:  Rest, ice, applying pressure (compression), and raising the affected foot (elevation). This may be called RICE therapy. Your health care provider may recommend RICE therapy along with over-the-counter pain medicines to manage your pain.  Exercises to stretch your calves and your plantar fascia.  A splint that holds your foot in a stretched, upward position while you sleep (night splint).  Physical therapy to relieve symptoms and prevent problems in the future.  Injections of steroid medicine (cortisone) to relieve pain and inflammation.  Stimulating your plantar fascia with electrical impulses (extracorporeal shock wave therapy). This is usually the last treatment option before surgery.  Surgery, if other treatments have not worked after 12 months. Follow these instructions at home:  Managing pain, stiffness, and swelling  If directed, put ice on the painful area: ? Put ice in a plastic bag, or use a frozen bottle of water. ? Place a towel between your skin and the bag or bottle. ? Roll the bottom of your foot over the bag or bottle. ? Do this for 20 minutes, 2-3 times a day.  Wear athletic shoes that have air-sole or gel-sole cushions, or try wearing soft shoe inserts that are designed for plantar fasciitis.  Raise (elevate) your foot above the level of your heart while you are sitting or lying down. Activity  Avoid activities that cause pain. Ask your health care provider what  activities are safe for you.  Do physical therapy exercises and stretches as told by your health care  provider.  Try activities and forms of exercise that are easier on your joints (low-impact). Examples include swimming, water aerobics, and biking. General instructions  Take over-the-counter and prescription medicines only as told by your health care provider.  Wear a night splint while sleeping, if told by your health care provider. Loosen the splint if your toes tingle, become numb, or turn cold and blue.  Maintain a healthy weight, or work with your health care provider to lose weight as needed.  Keep all follow-up visits as told by your health care provider. This is important. Contact a health care provider if you:  Have symptoms that do not go away after caring for yourself at home.  Have pain that gets worse.  Have pain that affects your ability to move or do your daily activities. Summary  Plantar fasciitis is a painful foot condition that affects the heel. It occurs when the band of tissue that connects the toes to the heel bone (plantar fascia) becomes irritated.  The main symptom of this condition is heel pain that may be worse after exercising too much or standing still for a long time.  Treatment varies, but it usually starts with rest, ice, compression, and elevation (RICE therapy) and over-the-counter medicines to manage pain. This information is not intended to replace advice given to you by your health care provider. Make sure you discuss any questions you have with your health care provider. Document Released: 01/31/2001 Document Revised: 03/05/2017 Document Reviewed: 03/05/2017 Elsevier Interactive Patient Education  2019 [employer].

## 2018-05-23 ENCOUNTER — Encounter: Payer: Self-pay | Admitting: Internal Medicine

## 2018-06-04 ENCOUNTER — Other Ambulatory Visit (INDEPENDENT_AMBULATORY_CARE_PROVIDER_SITE_OTHER): Payer: 59

## 2018-06-04 DIAGNOSIS — Z13220 Encounter for screening for lipoid disorders: Secondary | ICD-10-CM | POA: Diagnosis not present

## 2018-06-04 DIAGNOSIS — R5383 Other fatigue: Secondary | ICD-10-CM

## 2018-06-04 DIAGNOSIS — Z1329 Encounter for screening for other suspected endocrine disorder: Secondary | ICD-10-CM | POA: Diagnosis not present

## 2018-06-04 DIAGNOSIS — E611 Iron deficiency: Secondary | ICD-10-CM | POA: Diagnosis not present

## 2018-06-04 DIAGNOSIS — E559 Vitamin D deficiency, unspecified: Secondary | ICD-10-CM

## 2018-06-04 DIAGNOSIS — Z1389 Encounter for screening for other disorder: Secondary | ICD-10-CM

## 2018-06-04 LAB — COMPREHENSIVE METABOLIC PANEL
ALT: 19 U/L (ref 0–35)
AST: 19 U/L (ref 0–37)
Albumin: 4.3 g/dL (ref 3.5–5.2)
Alkaline Phosphatase: 107 U/L (ref 39–117)
BUN: 22 mg/dL (ref 6–23)
CHLORIDE: 104 meq/L (ref 96–112)
CO2: 29 meq/L (ref 19–32)
Calcium: 9.8 mg/dL (ref 8.4–10.5)
Creatinine, Ser: 0.79 mg/dL (ref 0.40–1.20)
GFR: 78.83 mL/min (ref >60.00)
GLUCOSE: 94 mg/dL (ref 70–99)
POTASSIUM: 4.1 meq/L (ref 3.5–5.1)
SODIUM: 141 meq/L (ref 135–145)
TOTAL PROTEIN: 7.3 g/dL (ref 6.0–8.3)
Total Bilirubin: 0.5 mg/dL (ref 0.2–1.2)

## 2018-06-04 LAB — LIPID PANEL
CHOLESTEROL: 192 mg/dL (ref 0–200)
HDL: 49.1 mg/dL (ref >39.00)
LDL CALC: 115 mg/dL — AB (ref 0–99)
NonHDL: 142.63
TRIGLYCERIDES: 136 mg/dL (ref 0.0–149.0)
Total CHOL/HDL Ratio: 4
VLDL: 27.2 mg/dL (ref 0.0–40.0)

## 2018-06-04 LAB — IRON,TIBC AND FERRITIN PANEL
%SAT: 32 % (calc) (ref 16–45)
FERRITIN: 88 ng/mL (ref 16–232)
Iron: 116 ug/dL (ref 45–160)
TIBC: 357 mcg/dL (calc) (ref 250–450)

## 2018-06-04 LAB — CBC WITH DIFFERENTIAL/PLATELET
BASOS PCT: 0.8 % (ref 0.0–3.0)
Basophils Absolute: 0 10*3/uL (ref 0.0–0.1)
EOS ABS: 0.2 10*3/uL (ref 0.0–0.7)
EOS PCT: 4.3 % (ref 0.0–5.0)
HEMATOCRIT: 40.6 % (ref 36.0–46.0)
Hemoglobin: 13.7 g/dL (ref 12.0–15.0)
LYMPHS PCT: 23.4 % (ref 12.0–46.0)
Lymphs Abs: 1.3 10*3/uL (ref 0.7–4.0)
MCHC: 33.6 g/dL (ref 30.0–36.0)
MCV: 87.3 fl (ref 78.0–100.0)
Monocytes Absolute: 0.4 10*3/uL (ref 0.1–1.0)
Monocytes Relative: 7.8 % (ref 3.0–12.0)
NEUTROS ABS: 3.4 10*3/uL (ref 1.4–7.7)
NEUTROS PCT: 63.7 % (ref 43.0–77.0)
Platelets: 274 10*3/uL (ref 150.0–400.0)
RBC: 4.65 Mil/uL (ref 3.87–5.11)
RDW: 13.1 % (ref 11.5–15.5)
WBC: 5.4 10*3/uL (ref 4.0–10.5)

## 2018-06-04 LAB — T4, FREE: Free T4: 0.8 ng/dL (ref 0.60–1.60)

## 2018-06-04 LAB — VITAMIN D 25 HYDROXY (VIT D DEFICIENCY, FRACTURES): VITD: 42.21 ng/mL (ref 30.00–100.00)

## 2018-06-04 LAB — TSH: TSH: 2.07 u[IU]/mL (ref 0.35–4.50)

## 2018-06-06 ENCOUNTER — Encounter: Payer: Self-pay | Admitting: Internal Medicine

## 2018-06-06 NOTE — Telephone Encounter (Signed)
This encounter was created in error - please disregard.

## 2018-06-26 ENCOUNTER — Ambulatory Visit
Admission: RE | Admit: 2018-06-26 | Discharge: 2018-06-26 | Disposition: A | Discharge status: Home / Self Care | Payer: 59 | Source: Ambulatory Visit | Attending: Oncology | Admitting: Oncology

## 2018-06-26 DIAGNOSIS — M85851 Other specified disorders of bone density and structure, right thigh: Secondary | ICD-10-CM | POA: Diagnosis not present

## 2018-06-26 DIAGNOSIS — C50411 Malignant neoplasm of upper-outer quadrant of right female breast: Secondary | ICD-10-CM | POA: Diagnosis not present

## 2018-06-26 DIAGNOSIS — Z170 Estrogen receptor positive status [ER+]: Secondary | ICD-10-CM | POA: Diagnosis not present

## 2018-08-16 ENCOUNTER — Telehealth: Payer: Self-pay

## 2018-08-16 NOTE — Telephone Encounter (Signed)
Unable to leave VM. Patient needs to set nup virtual appointment, telephone appointment or mychart evisit.  If ok with virtual appointment we need current email address so we can send link that she will use 10-15 minute before appointment to log into.  [facility] may inform patient

## 2018-08-20 ENCOUNTER — Ambulatory Visit: Payer: 59 | Admitting: Internal Medicine

## 2018-08-29 ENCOUNTER — Telehealth: Payer: Self-pay | Admitting: Oncology

## 2018-08-29 NOTE — Telephone Encounter (Signed)
Pt has been sent MyChart message and 3 call attempts have been made in reference to appt scheduled for 4/14. Pt has not responded to any form of communication.

## 2018-09-03 ENCOUNTER — Inpatient Hospital Stay: Payer: 59 | Admitting: Oncology

## 2018-10-09 DIAGNOSIS — H31002 Unspecified chorioretinal scars, left eye: Secondary | ICD-10-CM | POA: Diagnosis not present

## 2018-10-09 IMAGING — MG MM DIGITAL DIAGNOSTIC BILAT W/ TOMO W/ CAD
8 of 15 series · 8 of 35 positions shown · non-contrast
Comparison: Previous exam(s).

CLINICAL DATA: 60-year-old female with new palpable lump in the
upper-outer right breast discovered on self-examination.

EXAM:
2D DIGITAL DIAGNOSTIC BILATERAL MAMMOGRAM WITH CAD AND ADJUNCT TOMO
ULTRASOUND RIGHT BREAST

[L CC]
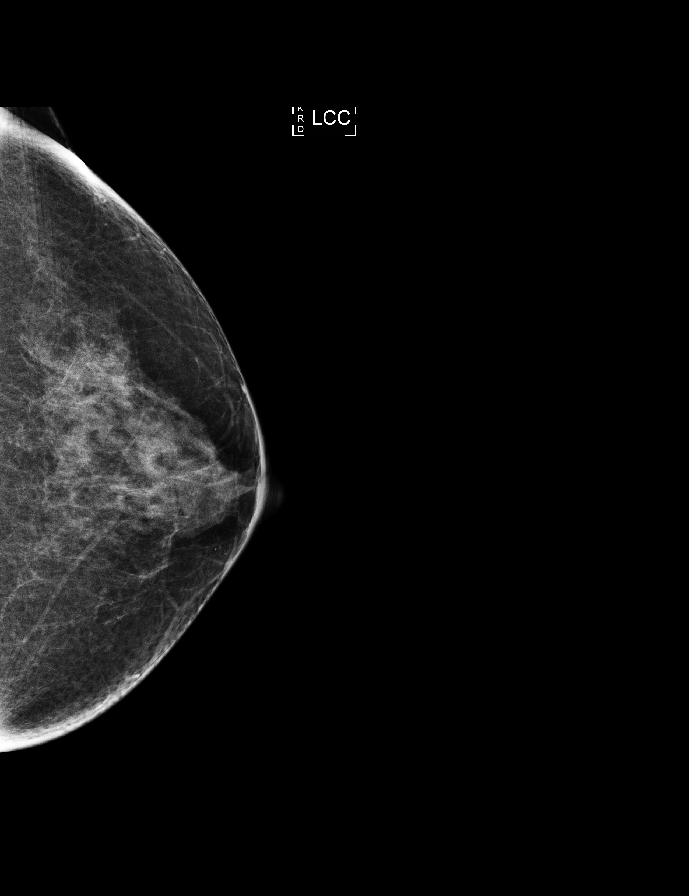

[L MLO]
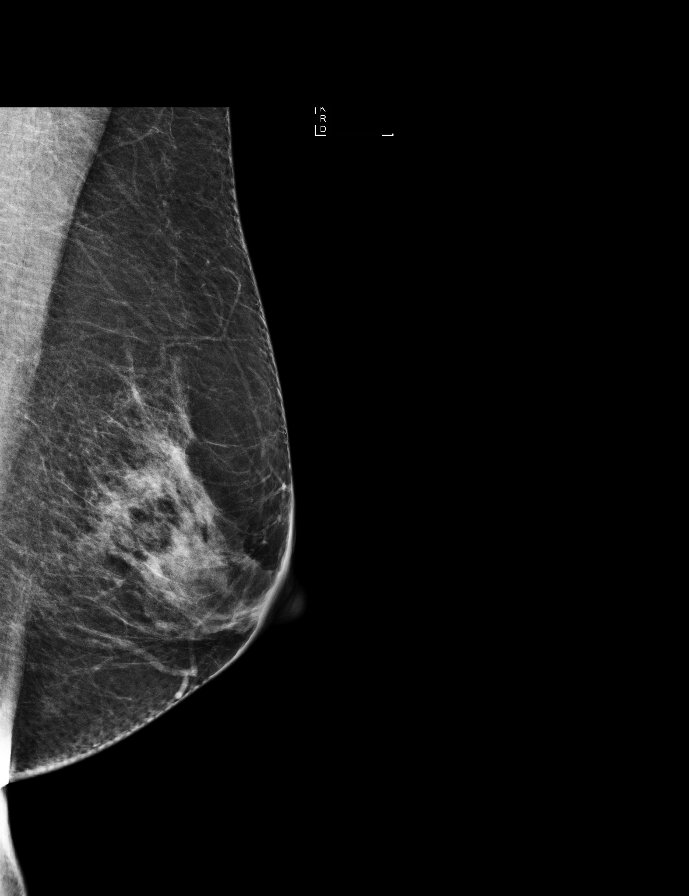

[R MLO synth-2D]
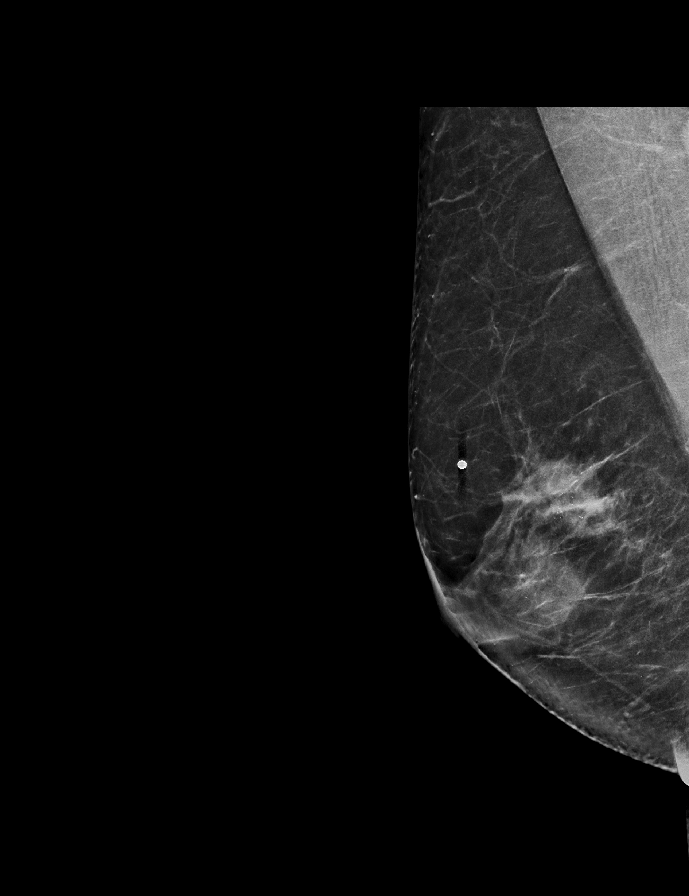

[L MLO synth-2D]
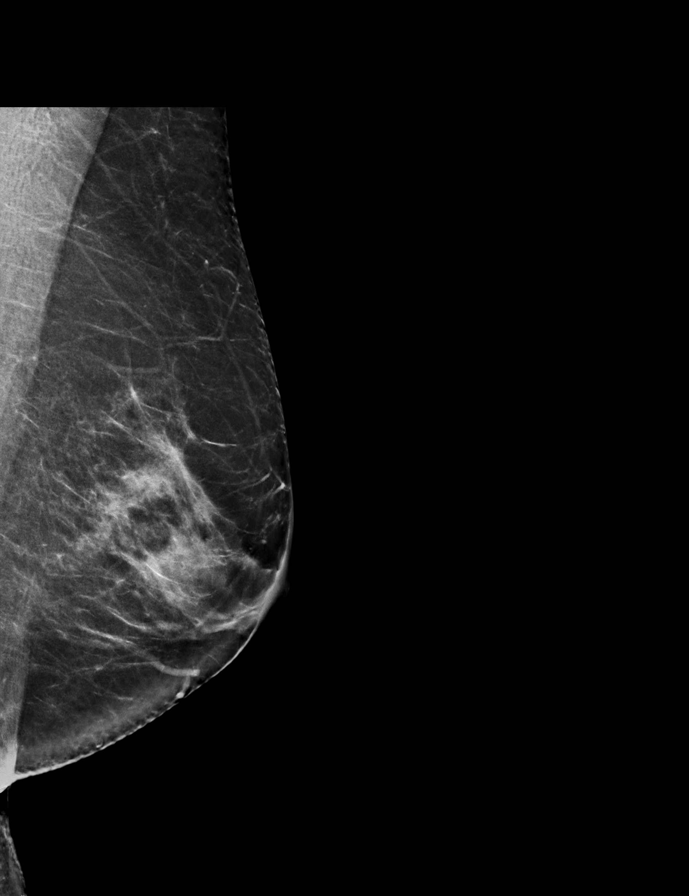

[R CC synth-2D]
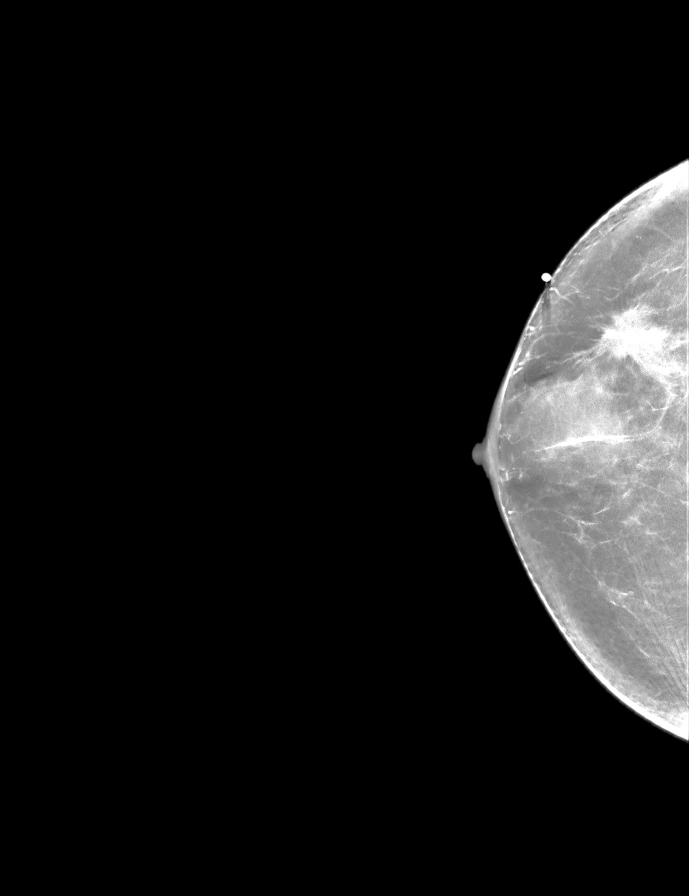

[R CC]
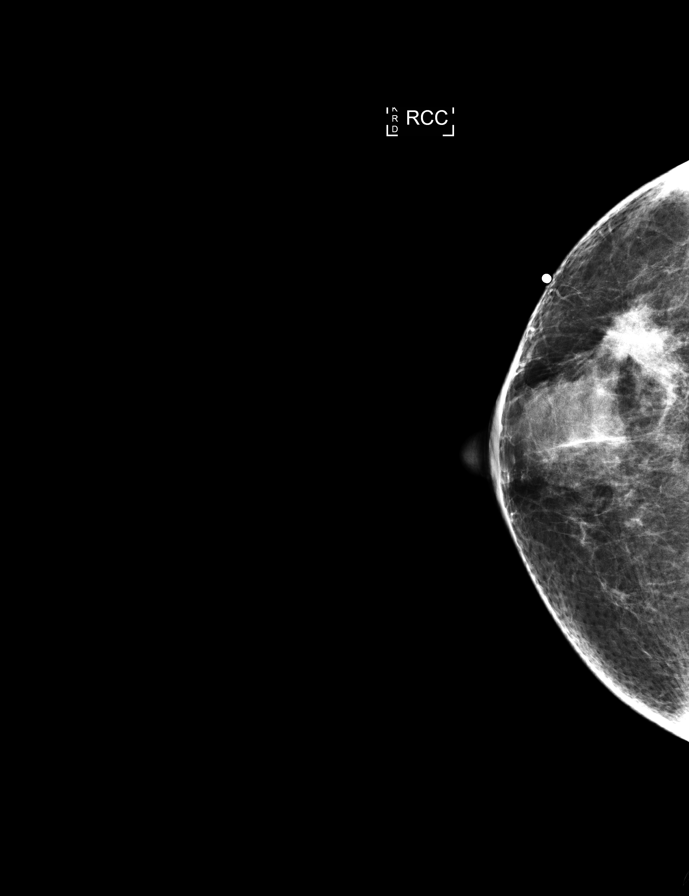

[R TAN]
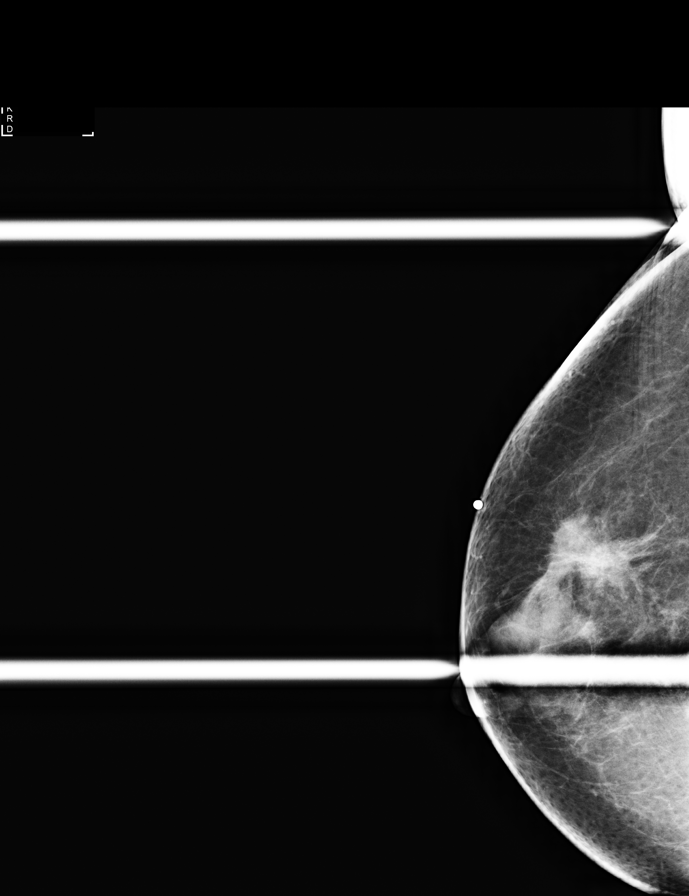

[L CC synth-2D]
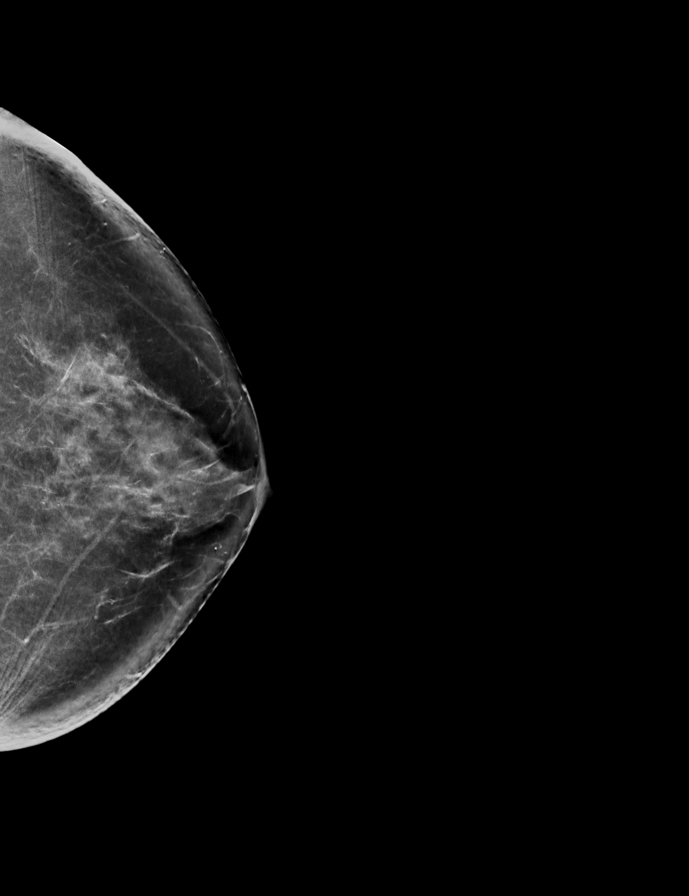

[8 of 35 positions shown; findings below may reference images not displayed]

ACR Breast Density Category b: There are scattered areas of
fibroglandular density.
FINDINGS: A new spiculated mass containing suspicious calcifications within
the upper-outer right breast is identified.

No other worrisome mass, distortion or suspicious calcifications are
noted bilaterally.

Mammographic images were processed with CAD.

On physical exam, a firm palpable mass identified at the 10 o'clock
position of the right breast 4 cm from the nipple.

Targeted ultrasound is performed, showing a 1.3 x 1.7 x 1.7 cm
irregular hypoechoic mass at the 10 o'clock position of the right
breast 4 cm from the nipple and containing calcifications. A right
axillary lymph node with focal cortical thickening measuring up to
4.5 mm is identified.
IMPRESSION: Highly suspicious 1.7 cm mass in the upper-outer right breast with
borderline right axillary lymph node. Tissue sampling is
recommended.

RECOMMENDATION:
Ultrasound-guided right breast and right axillary biopsies, which
will be arranged.

I have discussed the findings and recommendations with the patient.
Results were also provided in writing at the conclusion of the
visit. If applicable, a reminder letter will be sent to the patient
regarding the next appointment.

BI-RADS CATEGORY  5: Highly suggestive of malignancy.

## 2018-10-18 NOTE — Progress Notes (Deleted)
[place]  Telephone:(336) [phone] Fax:(336) [phone]  ID: Kimberly Clarke OB: 01-05-1956  MR#: [id number]  [id number]#:[id number]  Patient Care Team: McLean-Scocuzza, Nino Glow, MD as PCP - General (Internal Medicine) Theodore Demark, RN as Oncology Nurse Navigator Tamala Julian, Hillery [name], MD as Referring Physician (Surgery) Lloyd Huger, MD as Consulting Physician (Oncology) Clent Jacks, RN as Registered Nurse Ali Lowe, Ellwood Dense, MD as Referring Physician (Radiation Oncology)  CHIEF COMPLAINT: Pathologic stage IIa ER/PR positive, HER-2 negative invasive carcinoma of the upper outer quadrant of the right breast.  INTERVAL HISTORY: Patient returns to clinic today for routine six-month evaluation.  She continues to tolerate anastrozole well without significant side effects.  She currently feels well and is asymptomatic.  She has no neurologic complaints.  She denies any recent fevers or illnesses. She has a good appetite and denies weight loss. She has no chest pain or shortness of breath. She denies any nausea, vomiting, constipation, or diarrhea. She has no urinary complaints.  Patient feels at her baseline offers no specific complaints today.  REVIEW OF SYSTEMS:   Review of Systems  Constitutional: Negative.  Negative for fever, malaise/fatigue and weight loss.  HENT: Negative for congestion and sinus pain.   Respiratory: Negative.  Negative for cough and shortness of breath.   Cardiovascular: Negative.  Negative for chest pain and leg swelling.  Gastrointestinal: Negative for abdominal pain, constipation, diarrhea, nausea and vomiting.  Genitourinary: Negative.  Negative for dysuria.  Musculoskeletal: Negative.  Negative for joint pain.  Skin: Negative.  Negative for rash.  Neurological: Negative.  Negative for tingling, sensory change and weakness.  Psychiatric/Behavioral: Negative.  Negative for depression. The patient is not nervous/anxious and does  not have insomnia.     As per HPI. Otherwise, a complete review of systems is negative.  PAST MEDICAL HISTORY: Past Medical History:  Diagnosis Date  . Anxiety   . Cancer ([place]) 04/24/2016   right breast.  chemo finished 09/2016  . HSV infection    HISTORY OF HSV  . Personal history of chemotherapy   . Personal history of radiation therapy   . PONV (postoperative nausea and vomiting)    nausea no vomiting  . Post-menopausal 10/20/2005  . Postmenopause bleeding   . Spasmodic dysphonia   . Thickened endometrium 05/30/2016    PAST SURGICAL HISTORY: Past Surgical History:  Procedure Laterality Date  . ANAL FISTULECTOMY    . BREAST LUMPECTOMY Right 11/30/2016  . ENDOMETRIAL BIOPSY    . KNEE SURGERY     snow skating  . PARTIAL MASTECTOMY WITH NEEDLE LOCALIZATION Right 11/30/2016   Procedure: PARTIAL MASTECTOMY WITH NEEDLE LOCALIZATION;  Surgeon: Leonie Green, MD;  Location: [facility] OR[facility];  Service: General;  Laterality: Right;  . PORTACATH PLACEMENT N/A 05/23/2016   Procedure: INSERTION PORT-A-CATH;  Surgeon: Leonie Green, MD;  Location: [facility] OR[facility];  Service: General;  Laterality: N/A;  . removal of port a cath     01/2018  . SENTINEL NODE BIOPSY Right 11/30/2016   Procedure: SENTINEL NODE BIOPSY;  Surgeon: Leonie Green, MD;  Location: [facility];  Service: General;  Laterality: Right;    FAMILY HISTORY: Family History  Problem Relation Age of Onset  . Cancer Mother        BREAST AND BONES  . Breast cancer Mother 46  . Hearing loss Mother   . Emphysema Father   . Cancer Father   . Alcohol abuse Father   . Cancer Maternal Aunt   .  Hearing loss Maternal Grandmother   . Hypertension Sister   . Miscarriages / [place] Sister     ADVANCED DIRECTIVES (Y/N):  N  HEALTH MAINTENANCE: Social History   Tobacco Use  . Smoking status: Never Smoker  . Smokeless tobacco: Never Used  Substance Use Topics  . Alcohol use: Yes    Alcohol/week: 6.0 - 8.0 standard  drinks    Types: 3 - 4 Glasses of wine, 3 - 4 Cans of beer per week    Comment: WEEKENDS OCC  . Drug use: No    Types: Marijuana    Comment: In early 22     Colonoscopy:  PAP:  Bone density:  Lipid panel:  Allergies  Allergen Reactions  . Penicillins Anaphylaxis    Unknown; per pt rxn as kid ? Reaction      Current Outpatient Medications  Medication Sig Dispense Refill  . ALPRAZolam (XANAX) 0.25 MG tablet Take 0.5 tablets (0.125 mg total) by mouth 2 (two) times daily as needed for anxiety. 30 tablet 2  . anastrozole (ARIMIDEX) 1 MG tablet TAKE 1 TABLET (1 MG TOTAL) BY MOUTH DAILY. 90 tablet 3  . BIOTIN PO Take 1 tablet by mouth daily.     . calcium citrate-vitamin D (CITRACAL+D) 315-200 MG-UNIT tablet Take by mouth.    . docusate sodium (COLACE) 100 MG capsule Take 200 mg by mouth at bedtime.    . Melatonin 10 MG TABS Take 10 mg by mouth at bedtime. For sleep    . Multiple Vitamins-Minerals (MULTIVITAMIN ADULTS 50+) TABS Take 1 tablet by mouth daily.    . polycarbophil (FIBERCON) 625 MG tablet Take 1,250 mg by mouth at bedtime.     . traZODone (DESYREL) 50 MG tablet Take 0.5 tablets (25 mg total) by mouth at bedtime as needed for sleep. 30 tablet 2   No current facility-administered medications for this visit.     OBJECTIVE: There were no vitals filed for this visit.   There is no height or weight on file to calculate BMI.    ECOG FS:0 - Asymptomatic  General: Well-developed, well-nourished, no acute distress. Eyes: Pink conjunctiva, anicteric sclera. HEENT: Normocephalic, moist mucous membranes. Breast: Patient requested exam be deferred today. Lungs: Clear to auscultation bilaterally. Heart: Regular rate and rhythm. No rubs, murmurs, or gallops. Abdomen: Soft, nontender, nondistended. No organomegaly noted, normoactive bowel sounds. Musculoskeletal: No edema, cyanosis, or clubbing. Neuro: Alert, answering all questions appropriately. Cranial nerves grossly intact.  Skin: No rashes or petechiae noted. Psych: Normal affect.  LAB RESULTS:  Lab Results  Component Value Date   NA 141 06/04/2018   K 4.1 06/04/2018   CL 104 06/04/2018   CO2 29 06/04/2018   GLUCOSE 94 06/04/2018   BUN 22 06/04/2018   CREATININE 0.79 06/04/2018   CALCIUM 9.8 06/04/2018   PROT 7.3 06/04/2018   ALBUMIN 4.3 06/04/2018   AST 19 06/04/2018   ALT 19 06/04/2018   ALKPHOS 107 06/04/2018   BILITOT 0.5 06/04/2018   GFRNONAA >60 10/12/2016   GFRAA >60 10/12/2016    Lab Results  Component Value Date   WBC 5.4 06/04/2018   NEUTROABS 3.4 06/04/2018   HGB 13.7 06/04/2018   HCT 40.6 06/04/2018   MCV 87.3 06/04/2018   PLT 274.0 06/04/2018     STUDIES: No results found.  ASSESSMENT: Pathologic stage IIa ER/PR positive, HER-2 negative invasive carcinoma of the upper outer quadrant of the right breast.  PLAN:    1. Pathologic stage IIa ER/PR positive,  HER-2 negative invasive carcinoma of the upper outer quadrant of the right breast: Patient completed her neoadjuvant chemotherapy on Oct 12, 2016. Her lumpectomy was done on November 30, 2016. Patient completed her XRT at [facility] in approximately October 2018.  She could not tolerate letrozole and was switched to anastrozole.  Continue treatment for a total of 5 years completing in October 2023.  Patient's most recent mammogram on January 15, 2018 was reported as BI-RADS 2.  Repeat in August 2020.  Return to clinic in 6 months for routine evaluation. 2. Endometrial thickening: Patient had recent ultrasound and evaluation by gynecology oncology.  No further intervention is needed.  Continue routine Pap smears as instructed.   3. Peripheral neuropathy: Patient does not complain of this today. 4.  Osteopenia:  Patient had a bone mineral density on June 25, 2017 that reported a T score of -1.6.  Continue calcium and vitamin D supplementation.  Repeat in February 2020.  Patient expressed understanding and was in agreement with this plan.  She also understands that She can call clinic at any time with any questions, concerns, or complaints.   Cancer Staging Malignant neoplasm of upper-outer quadrant of female breast [facility]) Staging form: Breast, AJCC 7th Edition - Clinical stage from 04/26/2016: Stage IIA (T1c, N1, M0) - Signed by Lloyd Huger, MD on 05/25/2016 - Pathologic stage from 12/15/2016: Stage IIA (yT1c, N1a, cM0) - Signed by Lloyd Huger, MD on 12/15/2016    Lloyd Huger, MD 10/18/18 1:05 PM

## 2018-10-25 ENCOUNTER — Ambulatory Visit: Payer: 59 | Admitting: Oncology

## 2018-10-27 NOTE — Progress Notes (Signed)
[place]  Telephone:(336) [phone] Fax:(336) [phone]  ID: Kimberly Clarke OB: 04-29-1956  MR#: [id number]  VVO#:[id number]  Patient Care Team: McLean-Scocuzza, Nino Glow, MD as PCP - General (Internal Medicine) Theodore Demark, RN as Oncology Nurse Navigator Tamala Julian, Hillery [name], MD as Referring Physician (Surgery) Lloyd Huger, MD as Consulting Physician (Oncology) Clent Jacks, RN as Registered Nurse Ali Lowe, Ellwood Dense, MD as Referring Physician (Radiation Oncology)  CHIEF COMPLAINT: Pathologic stage IIa ER/PR positive, HER-2 negative invasive carcinoma of the upper outer quadrant of the right breast.  INTERVAL HISTORY: Patient returns to clinic today for routine 67-monthevaluation.  She feels slightly depressed since she was recently furloughed from her job secondary to the COVID-19 pandemic.  She otherwise feels well and is asymptomatic.  She continues to tolerate anastrozole without significant side effects. She has no neurologic complaints.  She denies any recent fevers or illnesses. She has a good appetite and denies weight loss.  She denies any chest pain, shortness of breath, cough, or hemoptysis.  She denies any nausea, vomiting, constipation, or diarrhea. She has no urinary complaints.  Patient offers no further specific complaints today.  REVIEW OF SYSTEMS:   Review of Systems  Constitutional: Negative.  Negative for fever, malaise/fatigue and weight loss.  HENT: Negative for congestion and sinus pain.   Respiratory: Negative.  Negative for cough and shortness of breath.   Cardiovascular: Negative.  Negative for chest pain and leg swelling.  Gastrointestinal: Negative for abdominal pain, constipation, diarrhea, nausea and vomiting.  Genitourinary: Negative.  Negative for dysuria.  Musculoskeletal: Negative.  Negative for joint pain.  Skin: Negative.  Negative for rash.  Neurological: Negative.  Negative for tingling, sensory change and  weakness.  Psychiatric/Behavioral: Negative for depression. The patient is nervous/anxious. The patient does not have insomnia.     As per HPI. Otherwise, a complete review of systems is negative.  PAST MEDICAL HISTORY: Past Medical History:  Diagnosis Date  . Anxiety   . Cancer ([place] 04/24/2016   right breast.  chemo finished 09/2016  . HSV infection    HISTORY OF HSV  . Personal history of chemotherapy   . Personal history of radiation therapy   . PONV (postoperative nausea and vomiting)    nausea no vomiting  . Post-menopausal 10/20/2005  . Postmenopause bleeding   . Spasmodic dysphonia   . Thickened endometrium 05/30/2016    PAST SURGICAL HISTORY: Past Surgical History:  Procedure Laterality Date  . ANAL FISTULECTOMY    . BREAST LUMPECTOMY Right 11/30/2016  . ENDOMETRIAL BIOPSY    . KNEE SURGERY     snow skating  . PARTIAL MASTECTOMY WITH NEEDLE LOCALIZATION Right 11/30/2016   Procedure: PARTIAL MASTECTOMY WITH NEEDLE LOCALIZATION;  Surgeon: SLeonie Green MD;  Location: [facility] ORS;  Service: General;  Laterality: Right;  . PORTACATH PLACEMENT N/A 05/23/2016   Procedure: INSERTION PORT-A-CATH;  Surgeon: JLeonie Green MD;  Location: [facility] ORS;  Service: General;  Laterality: N/A;  . removal of port a cath     01/2018  . SENTINEL NODE BIOPSY Right 11/30/2016   Procedure: SENTINEL NODE BIOPSY;  Surgeon: SLeonie Green MD;  Location: [facility] OR[facility];  Service: General;  Laterality: Right;    FAMILY HISTORY: Family History  Problem Relation Age of Onset  . Cancer Mother        BREAST AND BONES  . Breast cancer Mother 548 . Hearing loss Mother   . Emphysema Father   .  Cancer Father   . Alcohol abuse Father   . Cancer Maternal Aunt   . Hearing loss Maternal Grandmother   . Hypertension Sister   . Miscarriages / [place] Sister     ADVANCED DIRECTIVES (Y/N):  N  HEALTH MAINTENANCE: Social History   Tobacco Use  . Smoking status: Never Smoker  .  Smokeless tobacco: Never Used  Substance Use Topics  . Alcohol use: Yes    Alcohol/week: 6.0 - 8.0 standard drinks    Types: 3 - 4 Glasses of wine, 3 - 4 Cans of beer per week    Comment: WEEKENDS OCC  . Drug use: No    Types: Marijuana    Comment: In early 22     Colonoscopy:  PAP:  Bone density:  Lipid panel:  Allergies  Allergen Reactions  . Penicillins Anaphylaxis    Unknown; per pt rxn as kid ? Reaction      Current Outpatient Medications  Medication Sig Dispense Refill  . ALPRAZolam (XANAX) 0.25 MG tablet Take 0.5 tablets (0.125 mg total) by mouth 2 (two) times daily as needed for anxiety. 30 tablet 2  . anastrozole (ARIMIDEX) 1 MG tablet Take 1 tablet (1 mg total) by mouth daily. 90 tablet 3  . BIOTIN PO Take 1 tablet by mouth daily.     . calcium citrate-vitamin D (CITRACAL+D) 315-200 MG-UNIT tablet Take by mouth.    . docusate sodium (COLACE) 100 MG capsule Take 200 mg by mouth at bedtime.    . Melatonin 10 MG TABS Take 10 mg by mouth at bedtime. For sleep    . Multiple Vitamins-Minerals (MULTIVITAMIN ADULTS 50+) TABS Take 1 tablet by mouth daily.    . polycarbophil (FIBERCON) 625 MG tablet Take 1,250 mg by mouth at bedtime.     . traZODone (DESYREL) 50 MG tablet Take 0.5 tablets (25 mg total) by mouth at bedtime as needed for sleep. 30 tablet 2   No current facility-administered medications for this visit.     OBJECTIVE: Vitals:   11/01/18 1318  BP: 131/86  Pulse: 88  Temp: (!) 97.3 F (36.3 C)     Body mass index is 25.84 kg/m.    ECOG FS:0 - Asymptomatic  General: Well-developed, well-nourished, no acute distress. Eyes: Pink conjunctiva, anicteric sclera. HEENT: Normocephalic, moist mucous membranes. Breasts: Patient declined breast exam today. Lungs: Clear to auscultation bilaterally. Heart: Regular rate and rhythm. No rubs, murmurs, or gallops. Abdomen: Soft, nontender, nondistended. No organomegaly noted, normoactive bowel sounds. Musculoskeletal:  No edema, cyanosis, or clubbing. Neuro: Alert, answering all questions appropriately. Cranial nerves grossly intact. Skin: No rashes or petechiae noted. Psych: Normal affect.  LAB RESULTS:  Lab Results  Component Value Date   NA 141 06/04/2018   K 4.1 06/04/2018   CL 104 06/04/2018   CO2 29 06/04/2018   GLUCOSE 94 06/04/2018   BUN 22 06/04/2018   CREATININE 0.79 06/04/2018   CALCIUM 9.8 06/04/2018   PROT 7.3 06/04/2018   ALBUMIN 4.3 06/04/2018   AST 19 06/04/2018   ALT 19 06/04/2018   ALKPHOS 107 06/04/2018   BILITOT 0.5 06/04/2018   GFRNONAA >60 10/12/2016   GFRAA >60 10/12/2016    Lab Results  Component Value Date   WBC 5.4 06/04/2018   NEUTROABS 3.4 06/04/2018   HGB 13.7 06/04/2018   HCT 40.6 06/04/2018   MCV 87.3 06/04/2018   PLT 274.0 06/04/2018     STUDIES: No results found.  ASSESSMENT: Pathologic stage IIa ER/PR positive, HER-2  negative invasive carcinoma of the upper outer quadrant of the right breast.  PLAN:    1. Pathologic stage IIa ER/PR positive, HER-2 negative invasive carcinoma of the upper outer quadrant of the right breast: Patient completed her neoadjuvant chemotherapy on Oct 12, 2016. Her lumpectomy was done on November 30, 2016. Patient completed her XRT at [facility] in approximately October 2018.  She could not tolerate letrozole and was switched to anastrozole.  Continue treatment for a minimum of 5 years until October 2023.  Given her stage of disease, may consider extending treatment to 7-10 years. Patient's most recent mammogram on January 15, 2018 was reported as BI-RADS 2.  Repeat in August 2020.  Return to clinic in 6 months for routine evaluation.  2. Endometrial thickening: Patient had recent ultrasound and evaluation by gynecology oncology.  No further intervention is needed.  Continue routine Pap smears as instructed.   3. Peripheral neuropathy: Patient does not complain of this today. 4.  Osteopenia: Patient's most recent bone mineral density on  June 26, 2018 reported T score of -1.5 which is essentially unchanged from 1 year prior.  Continue calcium and vitamin D supplementation.  Repeat in February 2021.   Patient expressed understanding and was in agreement with this plan. She also understands that She can call clinic at any time with any questions, concerns, or complaints.   Cancer Staging Malignant neoplasm of upper-outer quadrant of female breast [facility]) Staging form: Breast, AJCC 7th Edition - Clinical stage from 04/26/2016: Stage IIA (T1c, N1, M0) - Signed by Lloyd Huger, MD on 05/25/2016 - Pathologic stage from 12/15/2016: Stage IIA (yT1c, N1a, cM0) - Signed by Lloyd Huger, MD on 12/15/2016    Lloyd Huger, MD 11/02/18 9:06 AM

## 2018-10-31 ENCOUNTER — Other Ambulatory Visit: Payer: Self-pay

## 2018-11-01 ENCOUNTER — Other Ambulatory Visit: Payer: Self-pay

## 2018-11-01 ENCOUNTER — Encounter: Payer: Self-pay | Admitting: Oncology

## 2018-11-01 ENCOUNTER — Inpatient Hospital Stay: Payer: 59 | Attending: Oncology | Admitting: Oncology

## 2018-11-01 VITALS — BP 131/86 | HR 88 | Temp 97.3°F | Ht 69.0 in | Wt 175.0 lb

## 2018-11-01 DIAGNOSIS — F329 Major depressive disorder, single episode, unspecified: Secondary | ICD-10-CM | POA: Diagnosis not present

## 2018-11-01 DIAGNOSIS — C50411 Malignant neoplasm of upper-outer quadrant of right female breast: Secondary | ICD-10-CM | POA: Insufficient documentation

## 2018-11-01 DIAGNOSIS — M8580 Other specified disorders of bone density and structure, unspecified site: Secondary | ICD-10-CM | POA: Diagnosis not present

## 2018-11-01 DIAGNOSIS — G629 Polyneuropathy, unspecified: Secondary | ICD-10-CM | POA: Insufficient documentation

## 2018-11-01 DIAGNOSIS — Z170 Estrogen receptor positive status [ER+]: Secondary | ICD-10-CM | POA: Insufficient documentation

## 2018-11-01 DIAGNOSIS — Z79811 Long term (current) use of aromatase inhibitors: Secondary | ICD-10-CM

## 2018-11-01 DIAGNOSIS — Z79899 Other long term (current) drug therapy: Secondary | ICD-10-CM | POA: Diagnosis not present

## 2018-11-01 DIAGNOSIS — Z9221 Personal history of antineoplastic chemotherapy: Secondary | ICD-10-CM | POA: Insufficient documentation

## 2018-11-01 DIAGNOSIS — Z9011 Acquired absence of right breast and nipple: Secondary | ICD-10-CM | POA: Diagnosis not present

## 2018-11-01 DIAGNOSIS — Z803 Family history of malignant neoplasm of breast: Secondary | ICD-10-CM

## 2018-11-01 DIAGNOSIS — Z800 Family history of malignant neoplasm of digestive organs: Secondary | ICD-10-CM | POA: Insufficient documentation

## 2018-11-01 DIAGNOSIS — F419 Anxiety disorder, unspecified: Secondary | ICD-10-CM

## 2018-11-01 DIAGNOSIS — R9389 Abnormal findings on diagnostic imaging of other specified body structures: Secondary | ICD-10-CM | POA: Diagnosis not present

## 2018-11-01 DIAGNOSIS — Z923 Personal history of irradiation: Secondary | ICD-10-CM | POA: Diagnosis not present

## 2018-11-01 NOTE — Progress Notes (Signed)
Patient stated that she was doing well with no complaints. Patient requested a refill on her Xanax and Anastrozole.

## 2018-11-02 MED ORDER — ANASTROZOLE 1 MG PO TABS: 1.0000 mg | ORAL_TABLET | Freq: Every day | ORAL | 3 refills | Status: DC

## 2018-11-02 MED ORDER — ALPRAZOLAM 0.25 MG PO TABS: 0.1250 mg | ORAL_TABLET | Freq: Two times a day (BID) | ORAL | 2 refills | Status: DC | PRN

## 2019-01-17 ENCOUNTER — Ambulatory Visit
Admission: RE | Admit: 2019-01-17 | Discharge: 2019-01-17 | Disposition: A | Discharge status: Home / Self Care | Payer: Self-pay | Source: Ambulatory Visit | Attending: Oncology | Admitting: Oncology

## 2019-01-17 ENCOUNTER — Other Ambulatory Visit: Payer: Self-pay | Admitting: Oncology

## 2019-01-17 DIAGNOSIS — C50411 Malignant neoplasm of upper-outer quadrant of right female breast: Secondary | ICD-10-CM

## 2019-01-17 DIAGNOSIS — Z170 Estrogen receptor positive status [ER+]: Secondary | ICD-10-CM | POA: Insufficient documentation

## 2019-01-17 DIAGNOSIS — N6320 Unspecified lump in the left breast, unspecified quadrant: Secondary | ICD-10-CM

## 2019-01-20 ENCOUNTER — Other Ambulatory Visit: Payer: Self-pay | Admitting: Oncology

## 2019-01-20 DIAGNOSIS — N6489 Other specified disorders of breast: Secondary | ICD-10-CM

## 2019-01-20 DIAGNOSIS — R928 Other abnormal and inconclusive findings on diagnostic imaging of breast: Secondary | ICD-10-CM

## 2019-02-05 ENCOUNTER — Ambulatory Visit
Admission: RE | Admit: 2019-02-05 | Discharge: 2019-02-05 | Disposition: A | Discharge status: Home / Self Care | Payer: Self-pay | Source: Ambulatory Visit | Attending: Oncology | Admitting: Oncology

## 2019-02-05 DIAGNOSIS — R928 Other abnormal and inconclusive findings on diagnostic imaging of breast: Secondary | ICD-10-CM | POA: Insufficient documentation

## 2019-02-05 DIAGNOSIS — N6489 Other specified disorders of breast: Secondary | ICD-10-CM

## 2019-02-05 HISTORY — PX: BREAST BIOPSY: SHX20

## 2019-02-06 LAB — SURGICAL PATHOLOGY

## 2019-04-16 NOTE — Progress Notes (Deleted)
[facility]  [phone]) [phone] Fax:(336) [phone]  ID: Kimberly Clarke OB: 06/11/1955  MR#: [id number]  GGE#:[id number]  Patient Care Team: McLean-Scocuzza, Nino Glow, MD as PCP - General (Internal Medicine) Theodore Demark, RN as Oncology Nurse Navigator Tamala Julian, Hillery [name], MD as Referring Physician (Surgery) Kimberly Huger, MD as Consulting Physician (Oncology) Clent Jacks, RN as Registered Nurse Ali Lowe, Ellwood Dense, MD as Referring Physician (Radiation Oncology)  I connected with Willaim Clarke on 04/16/19 at  2:00 PM EST by {Blank single:19197::"video enabled telemedicine visit","telephone visit"} and verified that I am speaking with the correct person using two identifiers.   I discussed the limitations, risks, security and privacy concerns of performing an evaluation and management service by telemedicine and the availability of in-person appointments. I also discussed with the patient that there may be a patient responsible charge related to this service. The patient expressed understanding and agreed to proceed.   Other persons participating in the visit and their role in the encounter: ***   Patients location: ***  Providers location: ***   CHIEF COMPLAINT: Pathologic stage IIa ER/PR positive, HER-2 negative invasive carcinoma of the upper outer quadrant of the right breast.  INTERVAL HISTORY: Patient returns to clinic today for routine 69-monthevaluation.  She feels slightly depressed since she was recently furloughed from her job secondary to the COVID-19 pandemic.  She otherwise feels well and is asymptomatic.  She continues to tolerate anastrozole without significant side effects. She has no neurologic complaints.  She denies any recent fevers or illnesses. She has a good appetite and denies weight loss.  She denies any chest pain, shortness of breath, cough, or hemoptysis.  She denies any nausea, vomiting, constipation, or  diarrhea. She has no urinary complaints.  Patient offers no further specific complaints today.  REVIEW OF SYSTEMS:   Review of Systems  Constitutional: Negative.  Negative for fever, malaise/fatigue and weight loss.  HENT: Negative for congestion and sinus pain.   Respiratory: Negative.  Negative for cough and shortness of breath.   Cardiovascular: Negative.  Negative for chest pain and leg swelling.  Gastrointestinal: Negative for abdominal pain, constipation, diarrhea, nausea and vomiting.  Genitourinary: Negative.  Negative for dysuria.  Musculoskeletal: Negative.  Negative for joint pain.  Skin: Negative.  Negative for rash.  Neurological: Negative.  Negative for tingling, sensory change and weakness.  Psychiatric/Behavioral: Negative for depression. The patient is nervous/anxious. The patient does not have insomnia.     As per HPI. Otherwise, a complete review of systems is negative.  PAST MEDICAL HISTORY: Past Medical History:  Diagnosis Date   Anxiety    Cancer ([place] 04/24/2016   right breast.  chemo finished 09/2016   HSV infection    HISTORY OF HSV   Personal history of chemotherapy    Personal history of radiation therapy    PONV (postoperative nausea and vomiting)    nausea no vomiting   Post-menopausal 10/20/2005   Postmenopause bleeding    Spasmodic dysphonia    Thickened endometrium 05/30/2016    PAST SURGICAL HISTORY: Past Surgical History:  Procedure Laterality Date   ANAL FISTULECTOMY     BREAST BIOPSY Left 02/05/2019   Stereo, XCora Daniels Pending path   BREAST LUMPECTOMY Right 11/30/2016   lumpectomy with rad and neoadj chemo   ENDOMETRIAL BIOPSY     KNEE SURGERY     snow skating   PARTIAL MASTECTOMY WITH NEEDLE LOCALIZATION Right 11/30/2016   Procedure: PARTIAL MASTECTOMY WITH  NEEDLE LOCALIZATION;  Surgeon: Leonie Green, MD;  Location: [facility];  Service: General;  Laterality: Right;   PORTACATH PLACEMENT N/A 05/23/2016   Procedure:  INSERTION PORT-A-CATH;  Surgeon: Leonie Green, MD;  Location: [facility] OR[facility];  Service: General;  Laterality: N/A;   removal of port a cath     01/2018   SENTINEL NODE BIOPSY Right 11/30/2016   Procedure: SENTINEL NODE BIOPSY;  Surgeon: Leonie Green, MD;  Location: [facility];  Service: General;  Laterality: Right;    FAMILY HISTORY: Family History  Problem Relation Age of Onset   Cancer Mother        BREAST AND BONES   Breast cancer Mother 28   Hearing loss Mother    Emphysema Father    Cancer Father    Alcohol abuse Father    Cancer Maternal Aunt    Hearing loss Maternal Grandmother    Hypertension Sister    Miscarriages / [place] Sister     ADVANCED DIRECTIVES (Y/N):  N  HEALTH MAINTENANCE: Social History   Tobacco Use   Smoking status: Never Smoker   Smokeless tobacco: Never Used  Substance Use Topics   Alcohol use: Yes    Alcohol/week: 6.0 - 8.0 standard drinks    Types: 3 - 4 Glasses of wine, 3 - 4 Cans of beer per week    Comment: WEEKENDS OCC   Drug use: No    Types: Marijuana    Comment: In early 22     Colonoscopy:  PAP:  Bone density:  Lipid panel:  Allergies  Allergen Reactions   Penicillins Anaphylaxis    Unknown; per pt rxn as kid ? Reaction      Current Outpatient Medications  Medication Sig Dispense Refill   ALPRAZolam (XANAX) 0.25 MG tablet Take 0.5 tablets (0.125 mg total) by mouth 2 (two) times daily as needed for anxiety. 30 tablet 2   anastrozole (ARIMIDEX) 1 MG tablet Take 1 tablet (1 mg total) by mouth daily. 90 tablet 3   BIOTIN PO Take 1 tablet by mouth daily.      calcium citrate-vitamin D (CITRACAL+D) 315-200 MG-UNIT tablet Take by mouth.     docusate sodium (COLACE) 100 MG capsule Take 200 mg by mouth at bedtime.     Melatonin 10 MG TABS Take 10 mg by mouth at bedtime. For sleep     Multiple Vitamins-Minerals (MULTIVITAMIN ADULTS 50+) TABS Take 1 tablet by mouth daily.     polycarbophil  (FIBERCON) 625 MG tablet Take 1,250 mg by mouth at bedtime.      traZODone (DESYREL) 50 MG tablet Take 0.5 tablets (25 mg total) by mouth at bedtime as needed for sleep. 30 tablet 2   No current facility-administered medications for this visit.     OBJECTIVE: There were no vitals filed for this visit.   There is no height or weight on file to calculate BMI.    ECOG FS:0 - Asymptomatic  General: Well-developed, well-nourished, no acute distress. Eyes: Pink conjunctiva, anicteric sclera. HEENT: Normocephalic, moist mucous membranes. Breasts: Patient declined breast exam today. Lungs: Clear to auscultation bilaterally. Heart: Regular rate and rhythm. No rubs, murmurs, or gallops. Abdomen: Soft, nontender, nondistended. No organomegaly noted, normoactive bowel sounds. Musculoskeletal: No edema, cyanosis, or clubbing. Neuro: Alert, answering all questions appropriately. Cranial nerves grossly intact. Skin: No rashes or petechiae noted. Psych: Normal affect.  LAB RESULTS:  Lab Results  Component Value Date   NA 141 06/04/2018   K 4.1 06/04/2018  CL 104 06/04/2018   CO2 29 06/04/2018   GLUCOSE 94 06/04/2018   BUN 22 06/04/2018   CREATININE 0.79 06/04/2018   CALCIUM 9.8 06/04/2018   PROT 7.3 06/04/2018   ALBUMIN 4.3 06/04/2018   AST 19 06/04/2018   ALT 19 06/04/2018   ALKPHOS 107 06/04/2018   BILITOT 0.5 06/04/2018   GFRNONAA >60 10/12/2016   GFRAA >60 10/12/2016    Lab Results  Component Value Date   WBC 5.4 06/04/2018   NEUTROABS 3.4 06/04/2018   HGB 13.7 06/04/2018   HCT 40.6 06/04/2018   MCV 87.3 06/04/2018   PLT 274.0 06/04/2018     STUDIES: No results found.  ASSESSMENT: Pathologic stage IIa ER/PR positive, HER-2 negative invasive carcinoma of the upper outer quadrant of the right breast.  PLAN:    1. Pathologic stage IIa ER/PR positive, HER-2 negative invasive carcinoma of the upper outer quadrant of the right breast: Patient completed her neoadjuvant  chemotherapy on Oct 12, 2016. Her lumpectomy was done on November 30, 2016. Patient completed her XRT at [facility] in approximately October 2018.  She could not tolerate letrozole and was switched to anastrozole.  Continue treatment for a minimum of 5 years until October 2023.  Given her stage of disease, may consider extending treatment to 7-10 years. Patient's most recent mammogram on January 15, 2018 was reported as BI-RADS 2.  Repeat in August 2020.  Return to clinic in 6 months for routine evaluation.  2. Endometrial thickening: Patient had recent ultrasound and evaluation by gynecology oncology.  No further intervention is needed.  Continue routine Pap smears as instructed.   3. Peripheral neuropathy: Patient does not complain of this today. 4.  Osteopenia: Patient's most recent bone mineral density on June 26, 2018 reported T score of -1.5 which is essentially unchanged from 1 year prior.  Continue calcium and vitamin D supplementation.  Repeat in February 2021.   I provided *** minutes of {Blank single:19197::"face-to-face video visit time","non face-to-face telephone visit time"} during this encounter, and > 50% was spent counseling as documented under my assessment & plan.   Patient expressed understanding and was in agreement with this plan. She also understands that She can call clinic at any time with any questions, concerns, or complaints.   Cancer Staging Malignant neoplasm of upper-outer quadrant of female breast [facility]) Staging form: Breast, AJCC 7th Edition - Clinical stage from 04/26/2016: Stage IIA (T1c, N1, M0) - Signed by Kimberly Huger, MD on 05/25/2016 - Pathologic stage from 12/15/2016: Stage IIA (yT1c, N1a, cM0) - Signed by Kimberly Huger, MD on 12/15/2016    Kimberly Huger, MD 04/16/19 3:05 PM

## 2019-04-23 ENCOUNTER — Telehealth: Payer: Self-pay | Admitting: Oncology

## 2019-04-23 NOTE — Telephone Encounter (Signed)
Left pt VM and sent a email to convert pt visit to Virtual Visit with no answer. Pt doesn't check Mychart from previous interactions and only responds to emails.

## 2019-04-25 ENCOUNTER — Ambulatory Visit: Payer: 59 | Admitting: Oncology

## 2019-06-25 ENCOUNTER — Other Ambulatory Visit: Payer: Self-pay | Admitting: *Deleted

## 2019-06-25 DIAGNOSIS — N6489 Other specified disorders of breast: Secondary | ICD-10-CM

## 2019-06-27 NOTE — Progress Notes (Signed)
[facility]  Telephone:(336) [phone] Fax:(336) [phone]  ID: Kimberly Clarke OB: 1955/06/22  MR#: [id number]  MAY#:[id number]  Patient Care Team: McLean-Scocuzza, Nino Glow, MD as PCP - General (Internal Medicine) Theodore Demark, RN as Oncology Nurse Navigator Tamala Julian, Hillery [name], MD as Referring Physician (Surgery) Lloyd Huger, MD as Consulting Physician (Oncology) Clent Jacks, RN as Registered Nurse Ali Lowe, Ellwood Dense, MD as Referring Physician (Radiation Oncology)   CHIEF COMPLAINT: Pathologic stage IIa ER/PR positive, HER-2 negative invasive carcinoma of the upper outer quadrant of the right breast.  INTERVAL HISTORY: Patient returns to clinic today for routine 42-monthevaluation.  Her appointment was delayed 1 to 2 months secondary to the Covid pandemic.  She is anxious, but otherwise feels well.  She complains of weight gain, but otherwise is tolerating anastrozole without significant side effects.  She has no neurologic complaints.  She denies any recent fevers or illnesses.  She has a good appetite.  She denies any chest pain, shortness of breath, cough, or hemoptysis.  She denies any nausea, vomiting, constipation, or diarrhea. She has no urinary complaints.  Patient offers no further specific complaints today.  REVIEW OF SYSTEMS:   Review of Systems  Constitutional: Negative.  Negative for fever, malaise/fatigue and weight loss.  HENT: Negative for congestion and sinus pain.   Respiratory: Negative.  Negative for cough and shortness of breath.   Cardiovascular: Negative.  Negative for chest pain and leg swelling.  Gastrointestinal: Negative for abdominal pain, constipation, diarrhea, nausea and vomiting.  Genitourinary: Negative.  Negative for dysuria.  Musculoskeletal: Negative.  Negative for joint pain.  Skin: Negative.  Negative for rash.  Neurological: Negative.  Negative for tingling, sensory change and weakness.    Psychiatric/Behavioral: Negative for depression. The patient is nervous/anxious. The patient does not have insomnia.     As per HPI. Otherwise, a complete review of systems is negative.  PAST MEDICAL HISTORY: Past Medical History:  Diagnosis Date  . Anxiety   [place]   [place].  chemo finished 09/2016  . HSV infection    HISTORY OF HSV  . Personal history of chemotherapy   . Personal history of radiation therapy   . PONV (postoperative nausea and vomiting)    nausea no vomiting  . Post-menopausal 10/20/2005  . Postmenopause bleeding   . Spasmodic dysphonia   . Thickened endometrium 05/30/2016    PAST SURGICAL HISTORY: Past Surgical History:  Procedure Laterality Date  . ANAL FISTULECTOMY    . BREAST BIOPSY Left 02/05/2019   [place] [facility] Pending path  . BREAST LUMPECTOMY Right 11/30/2016   lumpectomy with rad and neoadj chemo  . ENDOMETRIAL BIOPSY    . KNEE SURGERY     snow skating  . PARTIAL MASTECTOMY WITH NEEDLE LOCALIZATION Right 11/30/2016   Procedure: PARTIAL MASTECTOMY WITH NEEDLE LOCALIZATION;  Surgeon: SLeonie Green MD;  Location: [facility];  Service: General;  Laterality: Right;  . PORTACATH PLACEMENT N/A 05/23/2016   Procedure: INSERTION PORT-A-CATH;  Surgeon: JLeonie Green MD;  Location: [facility] OR[facility];  Service: General;  Laterality: N/A;  . removal of port a cath     01/2018  . SENTINEL NODE BIOPSY Right 11/30/2016   Procedure: SENTINEL NODE BIOPSY;  Surgeon: SLeonie Green MD;  Location: [facility] OR[facility];  Service: General;  Laterality: Right;    FAMILY HISTORY: Family History  Problem Relation Age of Onset  . Cancer Mother        BREAST  AND BONES  . Breast cancer Mother 52  . Hearing loss Mother   . Emphysema Father   . Cancer Father   . Alcohol abuse Father   . Cancer Maternal Aunt   . Hearing loss Maternal Grandmother   . Hypertension Sister   . Miscarriages / [place] Sister     ADVANCED DIRECTIVES (Y/N):   N  HEALTH MAINTENANCE: Social History   Tobacco Use  . Smoking status: Never Smoker  . Smokeless tobacco: Never Used  Substance Use Topics  . Alcohol use: Yes    Alcohol/week: 6.0 - 8.0 standard drinks    Types: 3 - 4 Glasses of wine, 3 - 4 Cans of beer per week    Comment: WEEKENDS OCC  . Drug use: No    Types: Marijuana    Comment: In early 22     Colonoscopy:  PAP:  Bone density:  Lipid panel:  Allergies  Allergen Reactions  . Penicillins Anaphylaxis    Unknown; per pt rxn as kid ? Reaction      Current Outpatient Medications  Medication Sig Dispense Refill  . ALPRAZolam (XANAX) 0.25 MG tablet Take 0.5 tablets (0.125 mg total) by mouth 2 (two) times daily as needed for anxiety. 30 tablet 2  . anastrozole (ARIMIDEX) 1 MG tablet Take 1 tablet (1 mg total) by mouth daily. 90 tablet 3  . BIOTIN PO Take 1 tablet by mouth daily.     . calcium citrate-vitamin D (CITRACAL+D) 315-200 MG-UNIT tablet Take by mouth.    . docusate sodium (COLACE) 100 MG capsule Take 200 mg by mouth at bedtime.    . Melatonin 10 MG TABS Take 10 mg by mouth at bedtime. For sleep    . Multiple Vitamins-Minerals (MULTIVITAMIN ADULTS 50+) TABS Take 1 tablet by mouth daily.    . polycarbophil (FIBERCON) 625 MG tablet Take 1,250 mg by mouth at bedtime.     . traZODone (DESYREL) 50 MG tablet Take 0.5 tablets (25 mg total) by mouth at bedtime as needed for sleep. 30 tablet 2   No current facility-administered medications for this visit.    OBJECTIVE: Vitals:   06/30/19 1438  BP: (!) 155/83  Pulse: (!) 105  Resp: 17  SpO2: 99%     Body mass index is 26.55 kg/m.    ECOG FS:0 - Asymptomatic  General: Well-developed, well-nourished, no acute distress. Eyes: Pink conjunctiva, anicteric sclera. HEENT: Normocephalic, moist mucous membranes. Breast: Patient declined exam today. Lungs: No audible wheezing or coughing. Heart: Tachycardic. Abdomen: Soft, nontender, no obvious  distention. Musculoskeletal: No edema, cyanosis, or clubbing. Neuro: Alert, answering all questions appropriately. Cranial nerves grossly intact. Skin: No rashes or petechiae noted. Psych: Normal affect.   LAB RESULTS:  Lab Results  Component Value Date   NA 141 06/04/2018   K 4.1 06/04/2018   CL 104 06/04/2018   CO2 29 06/04/2018   GLUCOSE 94 06/04/2018   BUN 22 06/04/2018   CREATININE 0.79 06/04/2018   CALCIUM 9.8 06/04/2018   PROT 7.3 06/04/2018   ALBUMIN 4.3 06/04/2018   AST 19 06/04/2018   ALT 19 06/04/2018   ALKPHOS 107 06/04/2018   BILITOT 0.5 06/04/2018   GFRNONAA >60 10/12/2016   GFRAA >60 10/12/2016    Lab Results  Component Value Date   WBC 5.4 06/04/2018   NEUTROABS 3.4 06/04/2018   HGB 13.7 06/04/2018   HCT 40.6 06/04/2018   MCV 87.3 06/04/2018   PLT 274.0 06/04/2018     STUDIES: No  results found.  ASSESSMENT: Pathologic stage IIa ER/PR positive, HER-2 negative invasive carcinoma of the upper outer quadrant of the right breast.  PLAN:    1. Pathologic stage IIa ER/PR positive, HER-2 negative invasive carcinoma of the upper outer quadrant of the right breast: Patient completed her neoadjuvant chemotherapy on Oct 12, 2016. Her lumpectomy was done on November 30, 2016. Patient completed her XRT at [facility] in approximately October 2018.  She could not tolerate letrozole and was switched to anastrozole.  Continue treatment for a minimum of 5 years until October 2023.  Given her stage of disease, may consider extending treatment to 7-10 years.  Patient's most recent mammogram on January 17, 2019 was read as BI-RADS 4, but subsequent biopsy was negative for malignancy.  Recommendation was to repeat mammogram in 6 months in March 2021.  Return to clinic in 6 months for routine evaluation.   2. Endometrial thickening: Patient had recent ultrasound and evaluation by gynecology oncology.  No further intervention is needed.  Continue routine Pap smears as instructed.   3.  Peripheral neuropathy: Patient does not complain of this today. 4.  Osteopenia: Patient's most recent bone mineral density on June 26, 2018 reported T score of -1.5 which is essentially unchanged from 1 year prior.  Continue calcium and vitamin D supplementation.  Repeat bone mineral density in March 2021 along with mammogram as above.  I spent a total of 20 minutes reviewing chart data, face-to-face evaluation with the patient, counseling and coordination of care as detailed above.   Patient expressed understanding and was in agreement with this plan. She also understands that She can call clinic at any time with any questions, concerns, or complaints.   Cancer Staging Malignant neoplasm of upper-outer quadrant of female breast [facility]) Staging form: Breast, AJCC 7th Edition - Clinical stage from 04/26/2016: Stage IIA (T1c, N1, M0) - Signed by Lloyd Huger, MD on 05/25/2016 - Pathologic stage from 12/15/2016: Stage IIA (yT1c, N1a, cM0) - Signed by Lloyd Huger, MD on 12/15/2016    Lloyd Huger, MD 06/30/19 3:21 PM

## 2019-06-30 ENCOUNTER — Encounter: Payer: Self-pay | Admitting: Oncology

## 2019-06-30 ENCOUNTER — Other Ambulatory Visit: Payer: Self-pay

## 2019-06-30 ENCOUNTER — Inpatient Hospital Stay: Payer: 59 | Attending: Oncology | Admitting: Oncology

## 2019-06-30 VITALS — BP 155/83 | HR 105 | Resp 17 | Wt 179.8 lb

## 2019-06-30 DIAGNOSIS — Z79811 Long term (current) use of aromatase inhibitors: Secondary | ICD-10-CM | POA: Insufficient documentation

## 2019-06-30 DIAGNOSIS — Z9221 Personal history of antineoplastic chemotherapy: Secondary | ICD-10-CM | POA: Diagnosis not present

## 2019-06-30 DIAGNOSIS — Z923 Personal history of irradiation: Secondary | ICD-10-CM | POA: Insufficient documentation

## 2019-06-30 DIAGNOSIS — F419 Anxiety disorder, unspecified: Secondary | ICD-10-CM | POA: Diagnosis not present

## 2019-06-30 DIAGNOSIS — R9389 Abnormal findings on diagnostic imaging of other specified body structures: Secondary | ICD-10-CM | POA: Insufficient documentation

## 2019-06-30 DIAGNOSIS — Z79899 Other long term (current) drug therapy: Secondary | ICD-10-CM | POA: Diagnosis not present

## 2019-06-30 DIAGNOSIS — Z803 Family history of malignant neoplasm of breast: Secondary | ICD-10-CM | POA: Insufficient documentation

## 2019-06-30 DIAGNOSIS — M8580 Other specified disorders of bone density and structure, unspecified site: Secondary | ICD-10-CM | POA: Insufficient documentation

## 2019-06-30 DIAGNOSIS — Z8249 Family history of ischemic heart disease and other diseases of the circulatory system: Secondary | ICD-10-CM | POA: Insufficient documentation

## 2019-06-30 DIAGNOSIS — Z170 Estrogen receptor positive status [ER+]: Secondary | ICD-10-CM | POA: Insufficient documentation

## 2019-06-30 DIAGNOSIS — C50411 Malignant neoplasm of upper-outer quadrant of right female breast: Secondary | ICD-10-CM

## 2019-06-30 NOTE — Progress Notes (Signed)
Pt here for follow up. No complaints or concerns today.

## 2019-07-22 ENCOUNTER — Ambulatory Visit
Admission: RE | Admit: 2019-07-22 | Discharge: 2019-07-22 | Disposition: A | Discharge status: Home / Self Care | Payer: 59 | Source: Ambulatory Visit | Attending: Oncology | Admitting: Oncology

## 2019-07-22 DIAGNOSIS — N6489 Other specified disorders of breast: Secondary | ICD-10-CM

## 2019-07-23 ENCOUNTER — Ambulatory Visit
Admission: RE | Admit: 2019-07-23 | Discharge: 2019-07-23 | Disposition: A | Discharge status: Home / Self Care | Payer: 59 | Source: Ambulatory Visit | Attending: Oncology | Admitting: Oncology

## 2019-07-23 DIAGNOSIS — Z170 Estrogen receptor positive status [ER+]: Secondary | ICD-10-CM | POA: Insufficient documentation

## 2019-07-23 DIAGNOSIS — C50411 Malignant neoplasm of upper-outer quadrant of right female breast: Secondary | ICD-10-CM | POA: Diagnosis present

## 2019-08-30 ENCOUNTER — Ambulatory Visit: Payer: 59 | Attending: Internal Medicine

## 2019-08-30 DIAGNOSIS — Z23 Encounter for immunization: Secondary | ICD-10-CM

## 2019-08-30 NOTE — Progress Notes (Signed)
   Covid-19 Vaccination Clinic  Name:  Natha Hanchey    MRN: [id number] DOB: 1956-03-26  08/30/2019  Ms. Elliemay Arnoldy was observed post Covid-19 immunization for 15 minutes without incident. She was provided with Vaccine Information Sheet and instruction to access the V-Safe system.   Ms. Ladana Kierce was instructed to call 911 with any severe reactions post vaccine: Marland Kitchen Difficulty breathing  . Swelling of face and throat  . A fast heartbeat  . A bad rash all over body  . Dizziness and weakness   Immunizations Administered    Name Date Dose VIS Date Route   Pfizer COVID-19 Vaccine 08/30/2019  1:24 PM 0.3 mL 05/02/2019 Intramuscular   Manufacturer: [place]   Lot: [id number]   [place]: [id number]

## 2019-09-23 ENCOUNTER — Ambulatory Visit: Payer: 59 | Attending: Internal Medicine

## 2019-09-23 DIAGNOSIS — Z23 Encounter for immunization: Secondary | ICD-10-CM

## 2019-09-23 NOTE — Progress Notes (Signed)
   Covid-19 Vaccination Clinic  Name:  Kimberly Clarke    MRN: [id number] DOB: [birth year]  09/23/2019  Ms. Kimberly Clarke was observed post Covid-19 immunization for 15 minutes without incident. She was provided with Vaccine Information Sheet and instruction to access the V-Safe system.   Ms. Kimberly Clarke was instructed to call 911 with any severe reactions post vaccine: Marland Kitchen Difficulty breathing  . Swelling of face and throat  . A fast heartbeat  . A bad rash all over body  . Dizziness and weakness   Immunizations Administered    Name Date Dose VIS Date Route   Pfizer COVID-19 Vaccine 09/23/2019  2:16 PM 0.3 mL 07/16/2018 Intramuscular   Manufacturer: [place]   Lot: [id number]   [place]: [id number]

## 2019-12-09 ENCOUNTER — Other Ambulatory Visit: Payer: Self-pay | Admitting: *Deleted

## 2019-12-09 MED ORDER — ANASTROZOLE 1 MG PO TABS: 1.0000 mg | ORAL_TABLET | Freq: Every day | ORAL | 3 refills | Status: DC

## 2019-12-26 NOTE — Progress Notes (Signed)
[name]  [phone]) [phone] [phone]) [phone]  ID: Kimberly Clarke OB: [birth year]  MR#: [id number]  AOZ#:[id number]  Patient Care Team: McLean-Scocuzza, Nino Glow, MD as PCP - General (Internal Medicine) Theodore Demark, RN as Oncology Nurse Navigator Kimberly Clarke, Kimberly Clarke [name], MD as Referring Physician (Surgery) Kimberly Huger, MD as Consulting Physician (Oncology) Kimberly Jacks, RN as Registered Nurse Ali Lowe, Kimberly Dense, MD as Referring Physician (Radiation Oncology)   CHIEF COMPLAINT: Pathologic stage IIa ER/PR positive, HER-2 negative invasive carcinoma of the upper outer quadrant of the right breast.  INTERVAL HISTORY: Patient returns to clinic today for routine 59-monthevaluation.  She continues to complain of weight gain, but otherwise is tolerating anastrozole well.  She has no neurologic complaints.  She denies any recent fevers or illnesses.  She has a good appetite.  She denies any chest pain, shortness of breath, cough, or hemoptysis.  She denies any nausea, vomiting, constipation, or diarrhea. She has no urinary complaints.  Patient offers no further specific complaints today.  REVIEW OF SYSTEMS:   Review of Systems  Constitutional: Negative.  Negative for fever, malaise/fatigue and weight loss.  HENT: Negative for congestion and sinus pain.   Respiratory: Negative.  Negative for cough and shortness of breath.   Cardiovascular: Negative.  Negative for chest pain and leg swelling.  Gastrointestinal: Negative for abdominal pain, constipation, diarrhea, nausea and vomiting.  Genitourinary: Negative.  Negative for dysuria.  Musculoskeletal: Negative.  Negative for joint pain.  Skin: Negative.  Negative for rash.  Neurological: Negative.  Negative for tingling, sensory change and weakness.  Psychiatric/Behavioral: Negative for depression. The patient is nervous/anxious. The patient does not have insomnia.     As per HPI. Otherwise, a  complete review of systems is negative.  PAST MEDICAL HISTORY: Past Medical History:  Diagnosis Date  . Anxiety   . [name]   [place].  chemo finished 09/2016  . HSV infection    HISTORY OF HSV  . Personal history of chemotherapy   . Personal history of radiation therapy   . PONV (postoperative nausea and vomiting)    nausea no vomiting  . Post-menopausal 10/20/2005  . Postmenopause bleeding   . Spasmodic dysphonia   . Thickened endometrium 05/30/2016    PAST SURGICAL HISTORY: Past Surgical History:  Procedure Laterality Date  . ANAL FISTULECTOMY    . BREAST BIOPSY Left 02/05/2019   Stereo, XCora Daniels  benign  . BREAST LUMPECTOMY Right 11/30/2016   lumpectomy with rad and neoadj chemo  . ENDOMETRIAL BIOPSY    . KNEE SURGERY     snow skating  . PARTIAL MASTECTOMY WITH NEEDLE LOCALIZATION Right 11/30/2016   Procedure: PARTIAL MASTECTOMY WITH NEEDLE LOCALIZATION;  Surgeon: Kimberly Green MD;  Location: [facility] ORS;  Service: General;  Laterality: Right;  . PORTACATH PLACEMENT N/A 05/23/2016   Procedure: INSERTION PORT-A-CATH;  Surgeon: Kimberly Green MD;  Location: [facility] ORS;  Service: General;  Laterality: N/A;  . removal of port a cath     01/2018  . SENTINEL NODE BIOPSY Right 11/30/2016   Procedure: SENTINEL NODE BIOPSY;  Surgeon: Kimberly Green MD;  Location: [facility] ORS;  Service: General;  Laterality: Right;    FAMILY HISTORY: Family History  Problem Relation Age of Onset  . Cancer Mother        BREAST AND BONES  . Breast cancer Mother 526 . Hearing loss Mother   . Emphysema Father   . Cancer Father   .  Alcohol abuse Father   . Cancer Maternal Aunt   . Hearing loss Maternal Grandmother   . Hypertension Sister   . Miscarriages / [place] Sister     ADVANCED DIRECTIVES (Y/N):  N  HEALTH MAINTENANCE: Social History   Tobacco Use  . Smoking status: Never Smoker  . Smokeless tobacco: Never Used  Vaping Use  . Vaping Use: Never  used  Substance Use Topics  . Alcohol use: Yes    Alcohol/week: 6.0 - 8.0 standard drinks    Types: 3 - 4 Glasses of Kimberly Clarke, 3 - 4 Cans of beer per week    Comment: WEEKENDS OCC  . Drug use: No    Types: Marijuana    Comment: In early 22     Colonoscopy:  PAP:  Bone density:  Lipid panel:  Allergies  Allergen Reactions  . Penicillins Anaphylaxis    Unknown; per pt rxn as kid ? Reaction      Current Outpatient Medications  Medication Sig Dispense Refill  . ALPRAZolam (XANAX) 0.25 MG tablet Take 0.5 tablets (0.125 mg total) by mouth 2 (two) times daily as needed for anxiety. 30 tablet 1  . anastrozole (ARIMIDEX) 1 MG tablet Take 1 tablet (1 mg total) by mouth daily. 90 tablet 3  . BIOTIN PO Take 1 tablet by mouth daily.     . calcium citrate-vitamin D (CITRACAL+D) 315-200 MG-UNIT tablet Take by mouth.    . docusate sodium (COLACE) 100 MG capsule Take 200 mg by mouth at bedtime.    . Melatonin 10 MG TABS Take 10 mg by mouth at bedtime. For sleep    . Multiple Vitamins-Minerals (MULTIVITAMIN ADULTS 50+) TABS Take 1 tablet by mouth daily.    . polycarbophil (FIBERCON) 625 MG tablet Take 1,250 mg by mouth at bedtime.     . traZODone (DESYREL) 50 MG tablet Take 0.5 tablets (25 mg total) by mouth at bedtime as needed for sleep. 30 tablet 2   No current facility-administered medications for this visit.    OBJECTIVE: Vitals:   12/29/19 1427  BP: (!) 133/98  Pulse: 86  Resp: 16  Temp: (!) 96.8 F (36 C)  SpO2: 98%     Body mass index is 26.02 kg/m.    ECOG FS:0 - Asymptomatic  General: Well-developed, well-nourished, no acute distress. Eyes: Pink conjunctiva, anicteric sclera. HEENT: Normocephalic, moist mucous membranes. Breast: Exam deferred today. Lungs: No audible wheezing or coughing. Heart: Regular rate and rhythm. Abdomen: Soft, nontender, no obvious distention. Musculoskeletal: No edema, cyanosis, or clubbing. Neuro: Alert, answering all questions appropriately.  Cranial nerves grossly intact. Skin: No rashes or petechiae noted. Psych: Normal affect.   LAB RESULTS:  Lab Results  Component Value Date   NA 141 06/04/2018   K 4.1 06/04/2018   CL 104 06/04/2018   CO2 29 06/04/2018   GLUCOSE 94 06/04/2018   BUN 22 06/04/2018   CREATININE 0.79 06/04/2018   CALCIUM 9.8 06/04/2018   PROT 7.3 06/04/2018   ALBUMIN 4.3 06/04/2018   AST 19 06/04/2018   ALT 19 06/04/2018   ALKPHOS 107 06/04/2018   BILITOT 0.5 06/04/2018   GFRNONAA >60 10/12/2016   GFRAA >60 10/12/2016    Lab Results  Component Value Date   WBC 5.4 06/04/2018   NEUTROABS 3.4 06/04/2018   HGB 13.7 06/04/2018   HCT 40.6 06/04/2018   MCV 87.3 06/04/2018   PLT 274.0 06/04/2018     STUDIES: No results found.  ASSESSMENT: Pathologic stage IIa ER/PR positive,  HER-2 negative invasive carcinoma of the upper outer quadrant of the right breast.  PLAN:    1. Pathologic stage IIa ER/PR positive, HER-2 negative invasive carcinoma of the upper outer quadrant of the right breast: Patient completed her neoadjuvant chemotherapy on Oct 12, 2016. Her lumpectomy was done on November 30, 2016. Patient [facility] her [facility] in approximately October 2018.  She could not tolerate letrozole and was switched to anastrozole.  Continue treatment for a minimum of 5 years until October 2023.  Given her stage of disease, may consider extending treatment to 7-10 years.  Patient had mammogram in August 2020 that was reported as BI-RADS 4, but subsequent biopsy was negative.  Repeat mammogram in March 2021 was reported as BI-RADS 2.  Patient will require follow-up bilateral mammogram in September 2021.  Return to clinic in 6 months for routine evaluation.   2.  History of endometrial thickening: Patient had recent ultrasound and evaluation by gynecology oncology.  No further intervention is needed.  Continue routine Pap smears as instructed.   3. Peripheral neuropathy: Patient does not complain of this  today. 4.  Osteopenia: Patient's most recent bone mineral density on July 23, 2019 reported T score of -1.7 which is only slightly decreased from 1 year prior where the T score was reported -1.5.  No intervention is needed.  Continue calcium and vitamin D supplementation.  Repeat in March 2022.   Patient expressed understanding and was in agreement with this plan. She also understands that She can call clinic at any time with any questions, concerns, or complaints.   Cancer Staging Malignant neoplasm of upper-outer quadrant of female breast [facility]) Staging form: Breast, AJCC 7th Edition - Clinical stage from 04/26/2016: Stage IIA (T1c, N1, M0) - Signed by Kimberly Huger, MD on 05/25/2016 - Pathologic stage from 12/15/2016: Stage IIA (yT1c, N1a, cM0) - Signed by Kimberly Huger, MD on 12/15/2016    Kimberly Huger, MD 12/30/19 8:30 AM

## 2019-12-29 ENCOUNTER — Inpatient Hospital Stay: Payer: 59 | Attending: Oncology | Admitting: Oncology

## 2019-12-29 ENCOUNTER — Other Ambulatory Visit: Payer: Self-pay

## 2019-12-29 ENCOUNTER — Encounter: Payer: Self-pay | Admitting: Oncology

## 2019-12-29 VITALS — BP 133/98 | HR 86 | Temp 96.8°F | Resp 16 | Wt 176.2 lb

## 2019-12-29 DIAGNOSIS — M8580 Other specified disorders of bone density and structure, unspecified site: Secondary | ICD-10-CM | POA: Diagnosis not present

## 2019-12-29 DIAGNOSIS — F419 Anxiety disorder, unspecified: Secondary | ICD-10-CM | POA: Insufficient documentation

## 2019-12-29 DIAGNOSIS — G629 Polyneuropathy, unspecified: Secondary | ICD-10-CM | POA: Insufficient documentation

## 2019-12-29 DIAGNOSIS — Z79899 Other long term (current) drug therapy: Secondary | ICD-10-CM | POA: Diagnosis not present

## 2019-12-29 DIAGNOSIS — Z170 Estrogen receptor positive status [ER+]: Secondary | ICD-10-CM | POA: Insufficient documentation

## 2019-12-29 DIAGNOSIS — Z923 Personal history of irradiation: Secondary | ICD-10-CM | POA: Diagnosis not present

## 2019-12-29 DIAGNOSIS — C50411 Malignant neoplasm of upper-outer quadrant of right female breast: Secondary | ICD-10-CM | POA: Diagnosis present

## 2019-12-29 DIAGNOSIS — Z803 Family history of malignant neoplasm of breast: Secondary | ICD-10-CM | POA: Insufficient documentation

## 2019-12-29 DIAGNOSIS — Z8249 Family history of ischemic heart disease and other diseases of the circulatory system: Secondary | ICD-10-CM | POA: Diagnosis not present

## 2019-12-29 DIAGNOSIS — Z9221 Personal history of antineoplastic chemotherapy: Secondary | ICD-10-CM | POA: Insufficient documentation

## 2019-12-29 MED ORDER — ALPRAZOLAM 0.25 MG PO TABS: 0.1250 mg | ORAL_TABLET | Freq: Two times a day (BID) | ORAL | 1 refills | Status: DC | PRN

## 2020-02-24 ENCOUNTER — Other Ambulatory Visit: Payer: Self-pay | Admitting: *Deleted

## 2020-02-24 DIAGNOSIS — C50411 Malignant neoplasm of upper-outer quadrant of right female breast: Secondary | ICD-10-CM

## 2020-03-15 ENCOUNTER — Ambulatory Visit
Admission: RE | Admit: 2020-03-15 | Discharge: 2020-03-15 | Disposition: A | Discharge status: Home / Self Care | Payer: 59 | Source: Ambulatory Visit | Attending: Oncology | Admitting: Oncology

## 2020-03-15 ENCOUNTER — Other Ambulatory Visit: Payer: Self-pay

## 2020-03-15 DIAGNOSIS — Z170 Estrogen receptor positive status [ER+]: Secondary | ICD-10-CM | POA: Insufficient documentation

## 2020-03-15 DIAGNOSIS — C50411 Malignant neoplasm of upper-outer quadrant of right female breast: Secondary | ICD-10-CM | POA: Diagnosis not present

## 2020-07-02 ENCOUNTER — Other Ambulatory Visit: Payer: Self-pay

## 2020-07-02 ENCOUNTER — Encounter: Payer: Self-pay | Admitting: Oncology

## 2020-07-02 ENCOUNTER — Inpatient Hospital Stay: Payer: 59 | Attending: Oncology | Admitting: Oncology

## 2020-07-02 VITALS — BP 159/96 | HR 90 | Temp 98.2°F | Resp 16 | Wt 180.0 lb

## 2020-07-02 DIAGNOSIS — Z170 Estrogen receptor positive status [ER+]: Secondary | ICD-10-CM | POA: Diagnosis not present

## 2020-07-02 DIAGNOSIS — R635 Abnormal weight gain: Secondary | ICD-10-CM | POA: Insufficient documentation

## 2020-07-02 DIAGNOSIS — M8580 Other specified disorders of bone density and structure, unspecified site: Secondary | ICD-10-CM | POA: Diagnosis not present

## 2020-07-02 DIAGNOSIS — Z923 Personal history of irradiation: Secondary | ICD-10-CM | POA: Diagnosis not present

## 2020-07-02 DIAGNOSIS — C50411 Malignant neoplasm of upper-outer quadrant of right female breast: Secondary | ICD-10-CM | POA: Diagnosis not present

## 2020-07-02 DIAGNOSIS — F419 Anxiety disorder, unspecified: Secondary | ICD-10-CM | POA: Diagnosis not present

## 2020-07-02 NOTE — Progress Notes (Signed)
Patient denies any concerns today.  

## 2020-07-05 NOTE — Progress Notes (Addendum)
[place]  [phone]) [phone] Fax:(336) [phone]  ID: Kimberly Clarke OB: [birth year]  MR#: [id number]  NOI#:[id number]  Patient Care Team: McLean-Scocuzza, Nino Glow, MD as PCP - General (Internal Medicine) Theodore Demark, RN as Oncology Nurse Navigator Tamala Julian, Hillery [name], MD as Referring Physician (Surgery) Lloyd Huger, MD as Consulting Physician (Oncology) Clent Jacks, RN as Registered Nurse Ali Lowe, Ellwood Dense, MD as Referring Physician (Radiation Oncology)   CHIEF COMPLAINT: Pathologic stage IIa ER/PR positive, HER-2 negative invasive carcinoma of the upper outer quadrant of the right breast.  INTERVAL HISTORY: Patient returns to clinic today for routine 32-monthevaluation.  She was last seen in clinic on 12/29/2019.  In the interim, she has done well.  She continues to endorse weight gain but is tolerating anastrozole well.  She also has issues with her sleep.  States she goes to bed early in the morning and sleeps until afternoon.  Was given a prescription for Ambien which she tried once but feels it was too strong for her.  Has tried melatonin intermittently.  She is retired but feels this is not normal.  Has tingling sensation in bilateral lower extremities intermittently.  Otherwise, she is doing well  REVIEW OF SYSTEMS:   Review of Systems  Constitutional: Negative.  Negative for fever, malaise/fatigue and weight loss.       20 lb weight gain  HENT: Negative for congestion and sinus pain.   Respiratory: Negative.  Negative for cough and shortness of breath.   Cardiovascular: Negative.  Negative for chest pain and leg swelling.  Gastrointestinal: Negative for abdominal pain, constipation, diarrhea, nausea and vomiting.  Genitourinary: Negative.  Negative for dysuria.  Musculoskeletal: Negative.  Negative for joint pain.  Skin: Negative.  Negative for rash.  Neurological: Negative.  Negative for tingling, sensory change and  weakness.  Psychiatric/Behavioral: Negative for depression. The patient is nervous/anxious and has insomnia.     As per HPI. Otherwise, a complete review of systems is negative.  PAST MEDICAL HISTORY: Past Medical History:  Diagnosis Date  . Anxiety   [place]   [place].  chemo finished 09/2016  . HSV infection    HISTORY OF HSV  . Personal history of chemotherapy   . Personal history of radiation therapy   . PONV (postoperative nausea and vomiting)    nausea no vomiting  . Post-menopausal 10/20/2005  . Postmenopause bleeding   . Spasmodic dysphonia   . Thickened endometrium 05/30/2016    PAST SURGICAL HISTORY: Past Surgical History:  Procedure Laterality Date  . ANAL FISTULECTOMY    . BREAST BIOPSY Left 02/05/2019   Stereo, XCora Daniels  benign  . BREAST LUMPECTOMY Right 11/30/2016   lumpectomy with rad and neoadj chemo  . ENDOMETRIAL BIOPSY    . KNEE SURGERY     snow skating  . PARTIAL MASTECTOMY WITH NEEDLE LOCALIZATION Right 11/30/2016   Procedure: PARTIAL MASTECTOMY WITH NEEDLE LOCALIZATION;  Surgeon: SLeonie Green MD;  Location: [facility] ORS;  Service: General;  Laterality: Right;  . PORTACATH PLACEMENT N/A 05/23/2016   Procedure: INSERTION PORT-A-CATH;  Surgeon: JLeonie Green MD;  Location: [facility] ORS;  Service: General;  Laterality: N/A;  . removal of port a cath     01/2018  . SENTINEL NODE BIOPSY Right 11/30/2016   Procedure: SENTINEL NODE BIOPSY;  Surgeon: SLeonie Green MD;  Location: [facility] ORS;  Service: General;  Laterality: Right;    FAMILY HISTORY: Family History  Problem Relation  Age of Onset  . Cancer Mother        BREAST AND BONES  . Breast cancer Mother 83  . Hearing loss Mother   . Emphysema Father   . Cancer Father   . Alcohol abuse Father   . Cancer Maternal Aunt   . Hearing loss Maternal Grandmother   . Hypertension Sister   . Miscarriages / [place] Sister     ADVANCED DIRECTIVES (Y/N):  N  HEALTH  MAINTENANCE: Social History   Tobacco Use  . Smoking status: Never Smoker  . Smokeless tobacco: Never Used  Vaping Use  . Vaping Use: Never used  Substance Use Topics  . Alcohol use: Yes    Alcohol/week: 6.0 - 8.0 standard drinks    Types: 3 - 4 Glasses of wine, 3 - 4 Cans of beer per week    Comment: WEEKENDS OCC  . Drug use: No    Types: Marijuana    Comment: In early 22     Colonoscopy:  PAP:  Bone density:  Lipid panel:  Allergies  Allergen Reactions  . Penicillins Anaphylaxis    Unknown; per pt rxn as kid ? Reaction      Current Outpatient Medications  Medication Sig Dispense Refill  . ALPRAZolam (XANAX) 0.25 MG tablet Take 0.5 tablets (0.125 mg total) by mouth 2 (two) times daily as needed for anxiety. 30 tablet 1  . anastrozole (ARIMIDEX) 1 MG tablet Take 1 tablet (1 mg total) by mouth daily. 90 tablet 3  . BIOTIN PO Take 1 tablet by mouth daily.     . calcium citrate-vitamin D (CITRACAL+D) 315-200 MG-UNIT tablet Take by mouth.    . docusate sodium (COLACE) 100 MG capsule Take 200 mg by mouth at bedtime.    . Melatonin 10 MG TABS Take 10 mg by mouth at bedtime. For sleep    . Multiple Vitamins-Minerals (MULTIVITAMIN ADULTS 50+) TABS Take 1 tablet by mouth daily.    . polycarbophil (FIBERCON) 625 MG tablet Take 1,250 mg by mouth at bedtime.      No current facility-administered medications for this visit.    OBJECTIVE: Vitals:   07/02/20 1425  BP: (!) 159/96  Pulse: 90  Resp: 16  Temp: 98.2 F (36.8 C)  SpO2: 98%     Body mass index is 26.58 kg/m.    ECOG FS:0 - Asymptomatic  Physical Exam Constitutional:      General: Vital signs are normal.     Appearance: Normal appearance.  HENT:     Head: Normocephalic and atraumatic.  Eyes:     Pupils: Pupils are equal, round, and reactive to light.  Cardiovascular:     Rate and Rhythm: Normal rate and regular rhythm.     Heart sounds: Normal heart sounds. No murmur heard.   Pulmonary:     Effort:  Pulmonary effort is normal.     Breath sounds: Normal breath sounds. No wheezing.  Abdominal:     General: Bowel sounds are normal. There is no distension.     Palpations: Abdomen is soft.     Tenderness: There is no abdominal tenderness.  Musculoskeletal:        General: No edema. Normal range of motion.     Cervical back: Normal range of motion.  Skin:    General: Skin is warm and dry.     Findings: No rash.  Neurological:     Mental Status: She is alert and oriented to person, place, and time.  Psychiatric:  Judgment: Judgment normal.      LAB RESULTS:  Lab Results  Component Value Date   NA 141 06/04/2018   K 4.1 06/04/2018   CL 104 06/04/2018   CO2 29 06/04/2018   GLUCOSE 94 06/04/2018   BUN 22 06/04/2018   CREATININE 0.79 06/04/2018   CALCIUM 9.8 06/04/2018   PROT 7.3 06/04/2018   ALBUMIN 4.3 06/04/2018   AST 19 06/04/2018   ALT 19 06/04/2018   ALKPHOS 107 06/04/2018   BILITOT 0.5 06/04/2018   GFRNONAA >60 10/12/2016   GFRAA >60 10/12/2016    Lab Results  Component Value Date   WBC 5.4 06/04/2018   NEUTROABS 3.4 06/04/2018   HGB 13.7 06/04/2018   HCT 40.6 06/04/2018   MCV 87.3 06/04/2018   PLT 274.0 06/04/2018     STUDIES: No results found.  ASSESSMENT: Pathologic stage IIa ER/PR positive, HER-2 negative invasive carcinoma of the upper outer quadrant of the right breast.  PLAN:    1. Pathologic stage IIa ER/PR positive, HER-2 negative invasive carcinoma of the upper outer quadrant of the right breast: -Completed neoadjuvant chemotherapy on 10/12/2016. -Had lumpectomy on 11/30/2016. -Completed XRT at [facility] in October 2018. -Initially started on letrozole but was unable to tolerate and ultimately switched to anastrozole. -Appears to be tolerating well except for 20 pound weight gain. -She will complete 5 years of treatment in October 2023. -Patient may benefit from extended therapy due to stage of disease -We will send for BCI testing. -Most  recent mammogram from 03/15/2020 was negative for recurrence.  Repeat in October 2022. -RTC in 6 months for follow-up.  2.  History of endometrial thickening:  -Patient had recent ultrasound and evaluation by gynecology oncology.   -No further intervention is needed.   -Continue routine Pap smears as instructed.    3.  Osteopenia:  -Bone mineral density on 07/23/2019 reported T score of -1.7 which is only slightly decreased from 1 year prior where the T score was reported -1.5.   -No intervention is needed.   -Continue calcium and vitamin D supplementation.   -Repeat bone density.  4. Sleep problems: -Has tried Ambien but feels this was too strong for her. -Is taking OTC melatonin but not routinely. -Discussed over-the-counter medications that she could try to help get her to sleep. -Recommend more activity throughout the day. -Recommend not eating heavy meals prior to bedtime.  5. Weight gain: -Secondary to anastrozole and sedentary lifestyle. -Recommend more activity and hydration.  Disposition: -Repeat bone density in March 2022. -RTC in 6 months for results of bone scan and f/u.  Patient expressed understanding and was in agreement with this plan. She also understands that She can call clinic at any time with any questions, concerns, or complaints.   Cancer Staging Malignant neoplasm of upper-outer quadrant of female breast [facility]) Staging form: Breast, AJCC 7th Edition - Clinical stage from 04/26/2016: Stage IIA (T1c, N1, M0) - Signed by Lloyd Huger, MD on 05/25/2016 Laterality: Right Estrogen receptor status: Positive Progesterone receptor status: Positive HER2 status: Negative - Pathologic stage from 12/15/2016: Stage IIA (yT1c, N1a, cM0) - Signed by Lloyd Huger, MD on 12/15/2016 Stage prefix: Post-therapy Laterality: Right Estrogen receptor status: Positive Progesterone receptor status: Positive HER2 status: Negative    Jacquelin Hawking, NP 07/05/20 4:44  AM

## 2020-07-27 ENCOUNTER — Ambulatory Visit
Admission: RE | Admit: 2020-07-27 | Discharge: 2020-07-27 | Disposition: A | Discharge status: Home / Self Care | Payer: PRIVATE HEALTH INSURANCE | Source: Ambulatory Visit | Attending: Oncology | Admitting: Oncology

## 2020-07-27 ENCOUNTER — Other Ambulatory Visit: Payer: Self-pay

## 2020-07-27 DIAGNOSIS — M8580 Other specified disorders of bone density and structure, unspecified site: Secondary | ICD-10-CM | POA: Insufficient documentation

## 2020-07-28 ENCOUNTER — Encounter: Payer: Self-pay | Admitting: Oncology

## 2020-08-12 ENCOUNTER — Encounter: Payer: Self-pay | Admitting: Family Medicine

## 2020-08-12 ENCOUNTER — Other Ambulatory Visit (HOSPITAL_COMMUNITY)
Admission: RE | Admit: 2020-08-12 | Discharge: 2020-08-12 | Disposition: A | Discharge status: Home / Self Care | Payer: PRIVATE HEALTH INSURANCE | Source: Ambulatory Visit | Attending: Family Medicine | Admitting: Family Medicine

## 2020-08-12 ENCOUNTER — Other Ambulatory Visit: Payer: Self-pay

## 2020-08-12 ENCOUNTER — Ambulatory Visit (INDEPENDENT_AMBULATORY_CARE_PROVIDER_SITE_OTHER): Payer: PRIVATE HEALTH INSURANCE | Admitting: Family Medicine

## 2020-08-12 VITALS — BP 157/101 | HR 121 | Ht 68.75 in | Wt 178.0 lb

## 2020-08-12 DIAGNOSIS — Z124 Encounter for screening for malignant neoplasm of cervix: Secondary | ICD-10-CM | POA: Insufficient documentation

## 2020-08-12 DIAGNOSIS — N83201 Unspecified ovarian cyst, right side: Secondary | ICD-10-CM

## 2020-08-12 DIAGNOSIS — R9389 Abnormal findings on diagnostic imaging of other specified body structures: Secondary | ICD-10-CM

## 2020-08-12 DIAGNOSIS — Z01419 Encounter for gynecological examination (general) (routine) without abnormal findings: Secondary | ICD-10-CM | POA: Diagnosis not present

## 2020-08-12 DIAGNOSIS — N83209 Unspecified ovarian cyst, unspecified side: Secondary | ICD-10-CM | POA: Insufficient documentation

## 2020-08-12 NOTE — Assessment & Plan Note (Signed)
On SERM, and at risk of endometrial hyperplasia/carcinoma. Normal EMB in 2018. U/s reveals small right sided simple cyst, that needed f/u.

## 2020-08-12 NOTE — Progress Notes (Signed)
  Subjective:     Kimberly Clarke is a 65 y.o. female and is here for a comprehensive physical exam. The patient reports problems - on SERM for Breast cancer recurrence prevention. Has had thickened lining since 2018 with negative Biopsy at that time. Patient reports some fullness in her abdomen. Mostly right sided and happens daily. She denies vaginal bleeding.   The following portions of the patient's history were reviewed and updated as appropriate: allergies, current medications, past family history, past medical history, past social history, past surgical history and problem list.  Review of Systems Pertinent items noted in HPI and remainder of comprehensive ROS otherwise negative.   Objective:    BP (!) 157/101   Pulse (!) 121   Ht 5' 8.75" (1.746 m)   Wt 178 lb (80.7 kg)   BMI 26.48 kg/m  General appearance: alert, cooperative and appears stated age Head: Normocephalic, without obvious abnormality, atraumatic Neck: no adenopathy, supple, symmetrical, trachea midline and thyroid not enlarged, symmetric, no tenderness/mass/nodules Lungs: clear to auscultation bilaterally Breasts: normal appearance, no masses or tenderness Heart: regular rate and rhythm, S1, S2 normal, no murmur, click, rub or gallop Abdomen: soft, non-tender; bowel sounds normal; no masses,  no organomegaly Pelvic: cervix normal in appearance, external genitalia normal, no adnexal masses or tenderness, no cervical motion tenderness, uterus normal size, shape, and consistency and vagina normal without discharge Extremities: extremities normal, atraumatic, no cyanosis or edema Pulses: 2+ and symmetric Skin: Skin color, texture, turgor normal. No rashes or lesions Lymph nodes: Cervical, supraclavicular, and axillary nodes normal. Neurologic: Grossly normal    Pelvic US 12/19 IMPRESSION: 1. Thickened heterogeneous endometrium with cystic spaces.Thickness: 13 mm. Heterogeneous with cystic spaces and  vascularity. Endometrial thickness is considered abnormal for an asymptomatic post-menopausal female. Endometrial sampling should be considered to exclude carcinoma. 2. Right ovarian simple appearing cyst measuring 2.3 cm. This is almost certainly benign, but follow up ultrasound is recommended in 1 year according to the Society of Radiologists in Ultrasound 2010 Consensus Conference Statement Keturah Barre Clovis Riley et al.   3. Small left para ovarian cyst.  1/18 Endometrial biopsy - RARE SURFACE TYPE ENDOMETRIAL FRAGMENTS BACKGROUND OF SCANT BENIGN  CERVICAL TISSUE.  - NEGATIVE FOR HYPERPLASIA AND CARCINOMA.    Assessment:    Healthy female exam.      Plan:   Problem List Items Addressed This Visit      Unprioritized   Thickened endometrium - Primary    On SERM, and at risk of endometrial hyperplasia/carcinoma. Normal EMB in 2018. U/s reveals small right sided simple cyst, that needed f/u.      Relevant Orders   US PELVIC COMPLETE WITH TRANSVAGINAL    Other Visit Diagnoses    Screening for malignant neoplasm of cervix       Relevant Orders   Cytology - PAP   Encounter for gynecological examination without abnormal finding         Return in 1 year (on 08/12/2021).    See After Visit Summary for Counseling Recommendations

## 2020-08-12 NOTE — Patient Instructions (Signed)
Preventive Care 84-63 Years Old, Female Preventive care refers to lifestyle choices and visits with your health care provider that can promote health and wellness. This includes:  A yearly physical exam. This is also called an annual wellness visit.  Regular dental and eye exams.  Immunizations.  Screening for certain conditions.  Healthy lifestyle choices, such as: ? Eating a healthy diet. ? Getting regular exercise. ? Not using drugs or products that contain nicotine and tobacco. ? Limiting alcohol use. What can I expect for my preventive care visit? Physical exam Your health care provider will check your:  Height and weight. These may be used to calculate your BMI (body mass index). BMI is a measurement that tells if you are at a healthy weight.  Heart rate and blood pressure.  Body temperature.  Skin for abnormal spots. Counseling Your health care provider may ask you questions about your:  Past medical problems.  Family's medical history.  Alcohol, tobacco, and drug use.  Emotional well-being.  Home life and relationship well-being.  Sexual activity.  Diet, exercise, and sleep habits.  Work and work Statistician.  Access to firearms.  Method of birth control.  Menstrual cycle.  Pregnancy history. What immunizations do I need? Vaccines are usually given at various ages, according to a schedule. Your health care provider will recommend vaccines for you based on your age, medical history, and lifestyle or other factors, such as travel or where you work.   What tests do I need? Blood tests  Lipid and cholesterol levels. These may be checked every 5 years, or more often if you are over 3 years old.  Hepatitis C test.  Hepatitis B test. Screening  Lung cancer screening. You may have this screening every year starting at age 73 if you have a 30-pack-year history of smoking and currently smoke or have quit within the past 15 years.  Colorectal cancer  screening. ? All adults should have this screening starting at age 52 and continuing until age 17. ? Your health care provider may recommend screening at age 49 if you are at increased risk. ? You will have tests every 1-10 years, depending on your results and the type of screening test.  Diabetes screening. ? This is done by checking your blood sugar (glucose) after you have not eaten for a while (fasting). ? You may have this done every 1-3 years.  Mammogram. ? This may be done every 1-2 years. ? Talk with your health care provider about when you should start having regular mammograms. This may depend on whether you have a family history of breast cancer.  BRCA-related cancer screening. This may be done if you have a family history of breast, ovarian, tubal, or peritoneal cancers.  Pelvic exam and Pap test. ? This may be done every 3 years starting at age 10. ? Starting at age 11, this may be done every 5 years if you have a Pap test in combination with an HPV test. Other tests  STD (sexually transmitted disease) testing, if you are at risk.  Bone density scan. This is done to screen for osteoporosis. You may have this scan if you are at high risk for osteoporosis. Talk with your health care provider about your test results, treatment options, and if necessary, the need for more tests. Follow these instructions at home: Eating and drinking  Eat a diet that includes fresh fruits and vegetables, whole grains, lean protein, and low-fat dairy products.  Take vitamin and mineral supplements  as recommended by your health care provider.  Do not drink alcohol if: ? Your health care provider tells you not to drink. ? You are pregnant, may be pregnant, or are planning to become pregnant.  If you drink alcohol: ? Limit how much you have to 0-1 drink a day. ? Be aware of how much alcohol is in your drink. In the [place] one drink equals one 12 oz bottle of beer (355 mL), one 5 oz glass of  wine (148 mL), or one 1 oz glass of hard liquor (44 mL).   Lifestyle  Take daily care of your teeth and gums. Brush your teeth every morning and night with fluoride toothpaste. Floss one time each day.  Stay active. Exercise for at least 30 minutes 5 or more days each week.  Do not use any products that contain nicotine or tobacco, such as cigarettes, e-cigarettes, and chewing tobacco. If you need help quitting, ask your health care provider.  Do not use drugs.  If you are sexually active, practice safe sex. Use a condom or other form of protection to prevent STIs (sexually transmitted infections).  If you do not wish to become pregnant, use a form of birth control. If you plan to become pregnant, see your health care provider for a prepregnancy visit.  If told by your health care provider, take low-dose aspirin daily starting at age 50.  Find healthy ways to cope with stress, such as: ? Meditation, yoga, or listening to music. ? Journaling. ? Talking to a trusted person. ? Spending time with friends and family. Safety  Always wear your seat belt while driving or riding in a vehicle.  Do not drive: ? If you have been drinking alcohol. Do not ride with someone who has been drinking. ? When you are tired or distracted. ? While texting.  Wear a helmet and other protective equipment during sports activities.  If you have firearms in your house, make sure you follow all gun safety procedures. What's next?  Visit your health care provider once a year for an annual wellness visit.  Ask your health care provider how often you should have your eyes and teeth checked.  Stay up to date on all vaccines. This information is not intended to replace advice given to you by your health care provider. Make sure you discuss any questions you have with your health care provider. Document Revised: 02/10/2020 Document Reviewed: 01/17/2018 Elsevier Patient Education  2021 Elsevier Inc.  

## 2020-08-16 LAB — CYTOLOGY - PAP
Comment: NEGATIVE
Diagnosis: NEGATIVE
High risk HPV: NEGATIVE

## 2020-09-06 ENCOUNTER — Ambulatory Visit
Admission: RE | Admit: 2020-09-06 | Discharge: 2020-09-06 | Disposition: A | Discharge status: Home / Self Care | Payer: PRIVATE HEALTH INSURANCE | Source: Ambulatory Visit | Attending: Family Medicine | Admitting: Family Medicine

## 2020-09-06 ENCOUNTER — Other Ambulatory Visit: Payer: Self-pay

## 2020-09-06 ENCOUNTER — Other Ambulatory Visit: Payer: Self-pay | Admitting: Family Medicine

## 2020-09-06 DIAGNOSIS — F419 Anxiety disorder, unspecified: Secondary | ICD-10-CM

## 2020-09-06 DIAGNOSIS — R9389 Abnormal findings on diagnostic imaging of other specified body structures: Secondary | ICD-10-CM | POA: Diagnosis present

## 2020-09-06 MED ORDER — ALPRAZOLAM 0.25 MG PO TABS: 0.1250 mg | ORAL_TABLET | Freq: Two times a day (BID) | ORAL | 1 refills | Status: DC | PRN

## 2020-11-10 ENCOUNTER — Encounter: Payer: Self-pay | Admitting: Family Medicine

## 2020-11-10 ENCOUNTER — Ambulatory Visit: Payer: PRIVATE HEALTH INSURANCE | Admitting: Family Medicine

## 2020-11-10 ENCOUNTER — Other Ambulatory Visit: Payer: Self-pay | Admitting: Family Medicine

## 2020-11-10 ENCOUNTER — Other Ambulatory Visit: Payer: Self-pay

## 2020-11-10 VITALS — BP 164/108 | HR 103

## 2020-11-10 DIAGNOSIS — R9389 Abnormal findings on diagnostic imaging of other specified body structures: Secondary | ICD-10-CM

## 2020-11-10 DIAGNOSIS — N83201 Unspecified ovarian cyst, right side: Secondary | ICD-10-CM

## 2020-11-10 NOTE — Assessment & Plan Note (Signed)
Office biopsy done today--f/u results.

## 2020-11-10 NOTE — Patient Instructions (Signed)

## 2020-11-10 NOTE — Progress Notes (Signed)
   Subjective:    Patient ID: Kimberly Clarke is a 65 y.o. female presenting with endometrial biopsy  on 11/10/2020  HPI: Patient with h/o Breast CA on Tamoxifen with thickened endometrium and bloating. U/S revealed endometrial stripe of 11 mm. She has a stable x 2 years right 2.7 cm simple cyst. Denies PMB.  Review of Systems  Constitutional:  Negative for chills and fever.  Respiratory:  Negative for shortness of breath.   Cardiovascular:  Negative for chest pain.  Gastrointestinal:  Negative for abdominal pain, nausea and vomiting.       Bloating  Genitourinary:  Negative for dysuria.  Skin:  Negative for rash.     Objective:    BP (!) 164/108   Pulse (!) 103  Physical Exam Constitutional:      General: She is not in acute distress.    Appearance: She is well-developed.  HENT:     Head: Normocephalic and atraumatic.  Eyes:     General: No scleral icterus. Cardiovascular:     Rate and Rhythm: Normal rate.  Pulmonary:     Effort: Pulmonary effort is normal.  Abdominal:     Palpations: Abdomen is soft.  Genitourinary:    Comments: BUS normal, vagina is pink and rugated, cervix is nulliparous without lesion  Musculoskeletal:     Cervical back: Neck supple.  Skin:    General: Skin is warm and dry.  Neurological:     Mental Status: She is alert and oriented to person, place, and time.   Procedure: Patient given informed consent, signed copy in the chart, time out was performed. Appropriate time out taken. . The patient was placed in the lithotomy position and the cervix brought into view with sterile speculum.  Portio of cervix cleansed x 2 with betadine swabs.  A tenaculum was placed in the anterior lip of the cervix.  The uterus was sounded for depth of 6 mm. A pipelle was introduced to into the uterus, suction created,  and an endometrial sample was obtained. Minimal tissue with 2 passes. All equipment was removed and accounted for.  The patient tolerated the  procedure well.          Assessment & Plan:   Problem List Items Addressed This Visit       Unprioritized   Thickened endometrium - Primary    Office biopsy done today--f/u results.       Relevant Orders   Surgical pathology( [place]/ [facility])   Ovarian cyst    Stable x 2 years.         Return if symptoms worsen or fail to improve.  [name] 11/10/2020 10:03 PM

## 2020-11-10 NOTE — Assessment & Plan Note (Signed)
Stable x 2 years.

## 2020-11-16 ENCOUNTER — Telehealth: Payer: Self-pay | Admitting: Internal Medicine

## 2020-11-16 NOTE — Telephone Encounter (Signed)
Call pt needs appt not seen in 2 years  In person

## 2020-11-18 DIAGNOSIS — R140 Abdominal distension (gaseous): Secondary | ICD-10-CM

## 2020-11-25 ENCOUNTER — Other Ambulatory Visit: Payer: Self-pay | Admitting: Oncology

## 2020-11-25 ENCOUNTER — Encounter: Payer: Self-pay | Admitting: Oncology

## 2020-12-08 ENCOUNTER — Other Ambulatory Visit: Payer: Self-pay | Admitting: *Deleted

## 2020-12-08 NOTE — Progress Notes (Signed)
erroneous

## 2021-01-10 ENCOUNTER — Other Ambulatory Visit: Payer: Self-pay | Admitting: *Deleted

## 2021-01-10 DIAGNOSIS — R140 Abdominal distension (gaseous): Secondary | ICD-10-CM

## 2021-02-01 ENCOUNTER — Other Ambulatory Visit: Payer: Self-pay | Admitting: *Deleted

## 2021-02-01 DIAGNOSIS — Z1231 Encounter for screening mammogram for malignant neoplasm of breast: Secondary | ICD-10-CM

## 2021-02-09 ENCOUNTER — Encounter: Payer: Self-pay | Admitting: Oncology

## 2021-03-09 NOTE — Telephone Encounter (Signed)
Yes I am not sure why we are involved at all.

## 2021-03-16 ENCOUNTER — Ambulatory Visit
Admission: RE | Admit: 2021-03-16 | Discharge: 2021-03-16 | Disposition: A | Discharge status: Home / Self Care | Payer: PRIVATE HEALTH INSURANCE | Source: Ambulatory Visit | Attending: Family Medicine | Admitting: Family Medicine

## 2021-03-16 ENCOUNTER — Other Ambulatory Visit: Payer: Self-pay

## 2021-03-16 DIAGNOSIS — Z1231 Encounter for screening mammogram for malignant neoplasm of breast: Secondary | ICD-10-CM | POA: Diagnosis not present

## 2021-04-20 ENCOUNTER — Encounter: Payer: Self-pay | Admitting: Family Medicine

## 2021-06-06 ENCOUNTER — Other Ambulatory Visit: Payer: Self-pay | Admitting: Family Medicine

## 2021-06-06 DIAGNOSIS — F419 Anxiety disorder, unspecified: Secondary | ICD-10-CM

## 2021-06-16 DIAGNOSIS — H0011 Chalazion right upper eyelid: Secondary | ICD-10-CM | POA: Diagnosis not present

## 2021-06-25 ENCOUNTER — Encounter: Payer: Self-pay | Admitting: Radiology

## 2021-08-01 ENCOUNTER — Encounter: Payer: Self-pay | Admitting: Family Medicine

## 2021-08-16 ENCOUNTER — Inpatient Hospital Stay: Payer: BC Managed Care – PPO | Attending: Oncology | Admitting: Nurse Practitioner

## 2021-08-16 ENCOUNTER — Ambulatory Visit: Payer: Self-pay | Admitting: Oncology

## 2021-08-16 ENCOUNTER — Encounter: Payer: Self-pay | Admitting: Nurse Practitioner

## 2021-08-16 ENCOUNTER — Other Ambulatory Visit: Payer: Self-pay

## 2021-08-16 VITALS — BP 156/98 | HR 108 | Temp 98.8°F | Ht 69.0 in | Wt 177.0 lb

## 2021-08-16 DIAGNOSIS — Z79811 Long term (current) use of aromatase inhibitors: Secondary | ICD-10-CM | POA: Insufficient documentation

## 2021-08-16 DIAGNOSIS — Z170 Estrogen receptor positive status [ER+]: Secondary | ICD-10-CM | POA: Diagnosis not present

## 2021-08-16 DIAGNOSIS — Z5181 Encounter for therapeutic drug level monitoring: Secondary | ICD-10-CM | POA: Diagnosis not present

## 2021-08-16 DIAGNOSIS — C50411 Malignant neoplasm of upper-outer quadrant of right female breast: Secondary | ICD-10-CM | POA: Insufficient documentation

## 2021-08-16 DIAGNOSIS — M8580 Other specified disorders of bone density and structure, unspecified site: Secondary | ICD-10-CM | POA: Diagnosis not present

## 2021-08-16 MED ORDER — ALENDRONATE SODIUM 70 MG PO TABS: 70.0000 mg | ORAL_TABLET | ORAL | 2 refills | Status: DC

## 2021-08-16 NOTE — Progress Notes (Signed)
?[facility]  ?[phone] [phone] ? ?ID: Kimberly Clarke OB: 1956/03/16  MR#: [id number]  ZFP#:[id number] ? ?Patient Care Team: ?Pcp, No as PCP - General ?Theodore Demark, RN as Oncology Nurse Navigator ?Leonie Green, MD as Referring Physician (Surgery) ?Lloyd Huger, MD as Consulting Physician (Oncology) ?Clent Jacks, RN as Registered Nurse ?Collier Bullock, MD as Referring Physician (Radiation Oncology) ?Jacquelin Hawking, NP as Nurse Practitioner (Oncology) ? ?CHIEF COMPLAINT: Pathologic stage IIa ER/PR positive, HER-2 negative invasive carcinoma of the upper outer quadrant of the right breast. ? ?INTERVAL HISTORY: Patient returns to clinic today for routine 91-monthevaluation. She continues to tolerate anastrozole well. She has not seen a primary care doctor in sometime due to changes in insurance and covid. Complains of bloating and at times blood in her stool. She has seen GI for this. She also is concerned that she has a problem with her thyroid because she has noticed some thickening in the center of her throat. No pain. Weight has been stable over past couple of years. Husband is going through potential job change which would result in change in insurance and no coverage for 30 days. Denies breast complaints, lumps, bumps, skin changes, or discharge. No interval falls or fractures.  ? ?REVIEW OF SYSTEMS:   ?Review of Systems  ?Constitutional:  Negative for chills, fever, malaise/fatigue and weight loss.  ?HENT:  Negative for congestion, hearing loss, nosebleeds, sore throat and tinnitus.   ?Eyes:  Negative for blurred vision and double vision.  ?Respiratory:  Negative for cough, hemoptysis, shortness of breath and wheezing.   ?Cardiovascular:  Negative for chest pain, palpitations and leg swelling.  ?Gastrointestinal:  Negative for abdominal pain, blood in stool, constipation, diarrhea, melena, nausea and vomiting.  ?Genitourinary:   Negative for dysuria and urgency.  ?Musculoskeletal:  Negative for back pain, falls, joint pain and myalgias.  ?Skin:  Negative for itching and rash.  ?Neurological:  Negative for dizziness, tingling, sensory change, loss of consciousness, weakness and headaches.  ?Endo/Heme/Allergies:  Negative for environmental allergies. Does not bruise/bleed easily.  ?Psychiatric/Behavioral:  Negative for depression. The patient is nervous/anxious. The patient does not have insomnia.   ?As per HPI. Otherwise, a complete review of systems is negative. ? ?PAST MEDICAL HISTORY: ?Past Medical History:  ?Diagnosis Date  ? Anxiety   ? [place] 04/24[place]  ? right breast.  chemo finished 09/2016  ? HSV infection   ? HISTORY OF HSV  ? Personal history of chemotherapy   ? Personal history of radiation therapy   ? PONV (postoperative nausea and vomiting)   ? nausea no vomiting  ? Post-menopausal 10/20/2005  ? Postmenopause bleeding   ? Spasmodic dysphonia   ? Thickened endometrium 05/30/2016  ? ? ?PAST SURGICAL HISTORY: ?Past Surgical History:  ?Procedure Laterality Date  ? ANAL FISTULECTOMY    ? BREAST BIOPSY Left 02/05/2019  ? Stereo, X-Clip,  benign  ? BREAST LUMPECTOMY Right 11/30/2016  ? lumpectomy with rad and neoadj chemo  ? ENDOMETRIAL BIOPSY    ? KNEE SURGERY    ? snow skating  ? PARTIAL MASTECTOMY WITH NEEDLE LOCALIZATION Right 11/30/2016  ? Procedure: PARTIAL MASTECTOMY WITH NEEDLE LOCALIZATION;  Surgeon: SLeonie Green MD;  Location: [facility];  Service: General;  Laterality: Right;  ? PORTACATH PLACEMENT N/A 05/23/2016  ? Procedure: INSERTION PORT-A-CATH;  Surgeon: JLeonie Green MD;  Location: [facility] ORS;  Service: General;  Laterality: N/A;  ? removal of port  a cath    ? 01/2018  ? SENTINEL NODE BIOPSY Right 11/30/2016  ? Procedure: SENTINEL NODE BIOPSY;  Surgeon: Leonie Green, MD;  Location: [facility] ORS;  Service: General;  Laterality: Right;  ? ? ?FAMILY HISTORY: ?Family History  ?Problem Relation Age of Onset   ? Cancer Mother   ?     BREAST AND BONES  ? Breast cancer Mother 22  ? Hearing loss Mother   ? Emphysema Father   ? Cancer Father   ? Alcohol abuse Father   ? Cancer Maternal Aunt   ? Hearing loss Maternal Grandmother   ? Hypertension Sister   ? Miscarriages / Stillbirths Sister   ? ? ?ADVANCED DIRECTIVES (Y/N):  N ? ?HEALTH MAINTENANCE: ?Social History  ? ?Tobacco Use  ? Smoking status: Never  ? Smokeless tobacco: Never  ?Vaping Use  ? Vaping Use: Never used  ?Substance Use Topics  ? Alcohol use: Yes  ?  Alcohol/week: 6.0 - 8.0 standard drinks  ?  Types: 3 - 4 Glasses of wine, 3 - 4 Cans of beer per week  ?  Comment: WEEKENDS OCC  ? Drug use: No  ?  Types: Marijuana  ?  Comment: In early 36  ? ? ? Colonoscopy: ? PAP: ? Bone density: ? Lipid panel: ? ?Allergies  ?Allergen Reactions  ? Penicillins Anaphylaxis  ?  Unknown; per pt rxn as kid ? Reaction  ?  ? ? ?Current Outpatient Medications  ?Medication Sig Dispense Refill  ? ALPRAZolam (XANAX) 0.25 MG tablet TAKE 1/2 TABLETS (0.125 MG TOTAL) BY MOUTH 2 (TWO) TIMES DAILY AS NEEDED FOR ANXIETY. 30 tablet 2  ? anastrozole (ARIMIDEX) 1 MG tablet TAKE 1 TABLET BY MOUTH EVERY DAY 90 tablet 3  ? BIOTIN PO Take 1 tablet by mouth daily.     ? calcium citrate-vitamin D (CITRACAL+D) 315-200 MG-UNIT tablet Take by mouth.    ? docusate sodium (COLACE) 100 MG capsule Take 200 mg by mouth at bedtime.    ? Melatonin 10 MG TABS Take 10 mg by mouth at bedtime. For sleep    ? Multiple Vitamins-Minerals (MULTIVITAMIN ADULTS 50+) TABS Take 1 tablet by mouth daily.    ? polycarbophil (FIBERCON) 625 MG tablet Take 1,250 mg by mouth at bedtime.     ? ?No current facility-administered medications for this visit.  ? ? ?OBJECTIVE: ?Vitals:  ? 08/16/21 1519  ?BP: (!) 156/98  ?Pulse: (!) 108  ?Temp: 98.8 ?F (37.1 ?C)  ?SpO2: 100%  ?   Body mass index is 26.14 kg/m?Marland Kitchen    ECOG FS:0 - Asymptomatic ? ?Physical Exam ?Constitutional:   ?   General: She is not in acute distress. ?   Appearance: She is  well-developed. She is not ill-appearing.  ?HENT:  ?   Head: Atraumatic.  ?   Mouth/Throat:  ?   Pharynx: No oropharyngeal exudate.  ?Eyes:  ?   General: No scleral icterus. ?Cardiovascular:  ?   Rate and Rhythm: Normal rate and regular rhythm.  ?Pulmonary:  ?   Effort: Pulmonary effort is normal.  ?   Breath sounds: Normal breath sounds.  ?Abdominal:  ?   General: There is no distension.  ?   Palpations: Abdomen is soft.  ?   Tenderness: There is no abdominal tenderness.  ?Musculoskeletal:     ?   General: No tenderness or deformity.  ?   Cervical back: Normal range of motion and neck supple.  ?   Comments:  ambulatory  ?Skin: ?   General: Skin is warm and dry.  ?   Coloration: Skin is not pale.  ?Neurological:  ?   General: No focal deficit present.  ?   Mental Status: She is alert and oriented to person, place, and time.  ?Psychiatric:     ?   Mood and Affect: Mood normal.     ?   Speech: Speech is tangential.     ?   Behavior: Behavior normal.  ? ? ? ?LAB RESULTS: ? ?Lab Results  ?Component Value Date  ? NA 141 06/04/2018  ? K 4.1 06/04/2018  ? CL 104 06/04/2018  ? CO2 29 06/04/2018  ? GLUCOSE 94 06/04/2018  ? BUN 22 06/04/2018  ? CREATININE 0.79 06/04/2018  ? CALCIUM 9.8 06/04/2018  ? PROT 7.3 06/04/2018  ? ALBUMIN 4.3 06/04/2018  ? AST 19 06/04/2018  ? ALT 19 06/04/2018  ? ALKPHOS 107 06/04/2018  ? BILITOT 0.5 06/04/2018  ? GFRNONAA >60 10/12/2016  ? GFRAA >60 10/12/2016  ? ? ?Lab Results  ?Component Value Date  ? WBC 5.4 06/04/2018  ? NEUTROABS 3.4 06/04/2018  ? HGB 13.7 06/04/2018  ? HCT 40.6 06/04/2018  ? MCV 87.3 06/04/2018  ? PLT 274.0 06/04/2018  ? ? ? ?STUDIES: ?No results found. ? ?ASSESSMENT: Pathologic stage IIa ER/PR positive, HER-2 negative invasive carcinoma of the upper outer quadrant of the right breast. ? ?PLAN:   ? ?1. Pathologic stage IIa ER/PR positive, HER-2 negative invasive carcinoma of the upper outer quadrant of the right breast: s/p neoadjuvant chemo 10/12/16 followed by lumpectomy  11/30/16 and completed adjuvant XRT (at [facility]) 02/2017. Unable to tolerate letrozole, switched to anastrozole. Plan to complete 5 years of adjuvant treatment in October 2023 given HR+ disease. Could consider

## 2021-08-16 NOTE — Progress Notes (Signed)
Having a lot of bloating and blood in stool. GI doctor is aware. ? ?Concerned with thyroid issue, would like lab done. Has some thickness in center of throat. ? ? ?Will her yearly appt here stop since she is coming up on her 5 year anniversary? ? ?Has questions reguarding anastrozole. ? ? ?

## 2021-08-22 ENCOUNTER — Ambulatory Visit: Payer: Self-pay

## 2021-08-22 NOTE — Telephone Encounter (Signed)
?  Chief Complaint: neck swelling ?Symptoms: neck swelling in thyroid area  ?Frequency: 1 month or more ?Pertinent Negatives: Patient denies pain or itching ?Disposition: '[]'$ ED /'[]'$ Urgent Care (no appt availability in office) / '[x]'$ Appointment(In office/virtual)/ '[]'$  [facility]/ '[]'$ Home Care/ '[]'$ Refused Recommended Disposition /'[]'$ [facility]/ '[]'$  Follow-up with PCP ?Additional Notes:  ? ? ?Reason for Disposition ? [1] Mild face swelling (puffiness) AND [2] persists > 3 days ? ?Answer Assessment - Initial Assessment Questions ?1. ONSET: "When did the swelling start?" (e.g., minutes, hours, days) ?    Unsure 1 month  ?2. LOCATION: "What part of the face is swollen?" ?    Neck area, thyroid area  ?3. SEVERITY: "How swollen is it?" ?    Mild  ?4. ITCHING: "Is there any itching?" If Yes, ask: "How much?"   (Scale 1-10; mild, moderate or severe) ?    none ?5. PAIN: "Is the swelling painful to touch?" If Yes, ask: "How painful is it?"   (Scale 1-10; mild, moderate or severe) ?  - NONE (0): no pain ?  - MILD (1-3): doesn't interfere with normal activities  ?  - MODERATE (4-7): interferes with normal activities or awakens from sleep  ?  - SEVERE (8-10): excruciating pain, unable to do any normal activities  ?    0 ?9. OTHER SYMPTOMS: "Do you have any other symptoms?" (e.g., toothache, leg swelling) ?    Swelling ? ?Protocols used: Face Swelling-A-AH ? ?

## 2021-08-23 ENCOUNTER — Encounter: Payer: Self-pay | Admitting: Physician Assistant

## 2021-08-23 ENCOUNTER — Encounter: Payer: Self-pay | Admitting: Family Medicine

## 2021-08-24 ENCOUNTER — Other Ambulatory Visit: Payer: Self-pay | Admitting: *Deleted

## 2021-08-24 ENCOUNTER — Ambulatory Visit: Payer: Self-pay | Admitting: Physician Assistant

## 2021-08-24 ENCOUNTER — Other Ambulatory Visit: Payer: BC Managed Care – PPO

## 2021-08-24 DIAGNOSIS — R221 Localized swelling, mass and lump, neck: Secondary | ICD-10-CM

## 2021-08-25 ENCOUNTER — Encounter: Payer: Self-pay | Admitting: Family Medicine

## 2021-08-25 LAB — T3, FREE: T3, Free: 3.5 pg/mL (ref 2.0–4.4)

## 2021-08-25 LAB — T4, FREE: Free T4: 1.13 ng/dL (ref 0.82–1.77)

## 2021-08-25 LAB — TSH: TSH: 2.63 u[IU]/mL (ref 0.450–4.500)

## 2021-09-01 ENCOUNTER — Other Ambulatory Visit: Payer: Self-pay | Admitting: Nurse Practitioner

## 2021-09-29 ENCOUNTER — Telehealth: Payer: Self-pay | Admitting: Nurse Practitioner

## 2021-09-29 NOTE — Telephone Encounter (Signed)
Patient called and asked for her mammo appointment to be scheduled for October. I called and scheduled it for her October 27. She wanted the results to be told to her right then so I asked about that and Melissa at [facility] said they only do that for diagnostic mammograms. Kimberly Clarke asked for me to send you a message and ask you to discuss that with her. This is her 5 year mammo and she is anxious to get the results.  Thank you  ?

## 2021-09-30 ENCOUNTER — Telehealth: Payer: Self-pay | Admitting: *Deleted

## 2021-09-30 NOTE — Telephone Encounter (Signed)
RN returned call to patient regarding mammogram results and got patient's answering machine which could not accept message because it was full.   ?

## 2021-10-12 ENCOUNTER — Ambulatory Visit: Payer: BC Managed Care – PPO | Admitting: Family Medicine

## 2021-10-19 ENCOUNTER — Ambulatory Visit: Payer: Self-pay | Admitting: Family Medicine

## 2021-10-19 ENCOUNTER — Encounter: Payer: Self-pay | Admitting: Nurse Practitioner

## 2021-11-23 ENCOUNTER — Ambulatory Visit (INDEPENDENT_AMBULATORY_CARE_PROVIDER_SITE_OTHER): Payer: BC Managed Care – PPO | Admitting: Family Medicine

## 2021-11-23 ENCOUNTER — Encounter: Payer: Self-pay | Admitting: Family Medicine

## 2021-11-23 DIAGNOSIS — R9389 Abnormal findings on diagnostic imaging of other specified body structures: Secondary | ICD-10-CM | POA: Diagnosis not present

## 2021-11-23 NOTE — Progress Notes (Signed)
   Subjective:    Patient ID: Kimberly Clarke is a 66 y.o. female presenting with No chief complaint on file.  on 11/23/2021  HPI: Patient with h/o Breast Ca on Tamoxifen with thickened endometrial stripe and on EMB noted to have a polyp--would like treatment and wants to schedule for D & C with hysteroscopic removal.  Review of Systems  Constitutional:  Negative for chills and fever.  Respiratory:  Negative for shortness of breath.   Cardiovascular:  Negative for chest pain.  Gastrointestinal:  Negative for abdominal pain, nausea and vomiting.  Genitourinary:  Negative for dysuria.  Skin:  Negative for rash.      Objective:    BP 136/88   Pulse 86   Wt 178 lb (80.7 kg)   BMI 26.29 kg/m  Physical Exam Exam conducted with a chaperone present.  Constitutional:      General: She is not in acute distress.    Appearance: She is well-developed.  HENT:     Head: Normocephalic and atraumatic.  Eyes:     General: No scleral icterus. Cardiovascular:     Rate and Rhythm: Normal rate.  Pulmonary:     Effort: Pulmonary effort is normal.  Abdominal:     Palpations: Abdomen is soft.  Musculoskeletal:     Cervical back: Neck supple.  Skin:    General: Skin is warm and dry.  Neurological:     Mental Status: She is alert and oriented to person, place, and time.         Assessment & Plan:   Problem List Items Addressed This Visit       Unprioritized   Thickened endometrium    With polyp on biopsy--will do D & C with Hysteroscopy Risks include but are not limited to bleeding, infection, injury to surrounding structures, including bowel, bladder and ureters, blood clots, and death.  Likelihood of success is high.         Return in about 3 months (around 02/23/2022) for postop check.  [name], MD 11/23/2021 3:56 PM

## 2021-11-23 NOTE — Assessment & Plan Note (Addendum)
With polyp on biopsy--will do D & C with Hysteroscopy Risks include but are not limited to bleeding, infection, injury to surrounding structures, including bowel, bladder and ureters, blood clots, and death.  Likelihood of success is high.

## 2021-11-27 ENCOUNTER — Other Ambulatory Visit: Payer: Self-pay | Admitting: Nurse Practitioner

## 2021-11-30 ENCOUNTER — Ambulatory Visit: Payer: Self-pay | Admitting: Family Medicine

## 2021-12-11 ENCOUNTER — Encounter: Payer: Self-pay | Admitting: Nurse Practitioner

## 2021-12-12 ENCOUNTER — Other Ambulatory Visit: Payer: Self-pay | Admitting: Nurse Practitioner

## 2021-12-12 MED ORDER — ANASTROZOLE 1 MG PO TABS: 1.0000 mg | ORAL_TABLET | Freq: Every day | ORAL | 1 refills | Status: DC

## 2021-12-15 ENCOUNTER — Encounter: Payer: Self-pay | Admitting: Family Medicine

## 2021-12-16 ENCOUNTER — Telehealth: Payer: Self-pay

## 2021-12-16 NOTE — Telephone Encounter (Signed)
Called patient to discuss potential surgery dates, no answer, unable to leave voicemail, voicemail box full, will schedule on Dr. Virginia Crews first available 08/17

## 2021-12-22 ENCOUNTER — Other Ambulatory Visit: Payer: Self-pay | Admitting: *Deleted

## 2021-12-22 DIAGNOSIS — M8580 Other specified disorders of bone density and structure, unspecified site: Secondary | ICD-10-CM

## 2021-12-22 MED ORDER — ALENDRONATE SODIUM 70 MG PO TABS: 70.0000 mg | ORAL_TABLET | ORAL | 2 refills | Status: DC

## 2021-12-22 NOTE — Telephone Encounter (Signed)
Patient sent mychart- requested RF for Fosamax.

## 2022-01-02 NOTE — H&P (Signed)
Kimberly Clarke is an 66 y.o. [place] female.   Chief Complaint: thickened endometrium HPI: H/o Breast Ca on Tamoxifen with thickened endometrial lining and desire for definitive treatment with negative EMB.  Past Medical History:  Diagnosis Date   Anxiety    Cancer ([place]) 04/24/2016   right breast.  chemo finished 09/2016   HSV infection    HISTORY OF HSV   Personal history of chemotherapy    Personal history of radiation therapy    PONV (postoperative nausea and vomiting)    nausea no vomiting   Post-menopausal 10/20/2005   Postmenopause bleeding    Spasmodic dysphonia    Thickened endometrium 05/30/2016    Past Surgical History:  Procedure Laterality Date   ANAL FISTULECTOMY     BREAST BIOPSY Left 02/05/2019   Stereo, Kimberly Clarke,  benign   BREAST LUMPECTOMY Right 11/30/2016   lumpectomy with rad and neoadj chemo   ENDOMETRIAL BIOPSY     KNEE SURGERY     snow skating   PARTIAL MASTECTOMY WITH NEEDLE LOCALIZATION Right 11/30/2016   Procedure: PARTIAL MASTECTOMY WITH NEEDLE LOCALIZATION;  Surgeon: Leonie Green, MD;  Location: [facility];  Service: General;  Laterality: Right;   PORTACATH PLACEMENT N/A 05/23/2016   Procedure: INSERTION PORT-A-CATH;  Surgeon: Leonie Green, MD;  Location: [facility];  Service: General;  Laterality: N/A;   removal of port a cath     01/2018   SENTINEL NODE BIOPSY Right 11/30/2016   Procedure: SENTINEL NODE BIOPSY;  Surgeon: Leonie Green, MD;  Location: [facility];  Service: General;  Laterality: Right;    Family History  Problem Relation Age of Onset   Cancer Mother        BREAST AND BONES   Breast cancer Mother 26   Hearing loss Mother    Emphysema Father    Cancer Father    Alcohol abuse Father    Cancer Maternal Aunt    Hearing loss Maternal Grandmother    Hypertension Sister    Miscarriages / [place] Sister    Social History:  reports that she has never smoked. She has never used smokeless tobacco. She reports current  alcohol use of about 6.0 - 8.0 standard drinks of alcohol per week. She reports that she does not use drugs.  Allergies:  Allergies  Allergen Reactions   Penicillins Anaphylaxis    Unknown; per pt rxn as kid ? Reaction      No medications prior to admission.    A comprehensive review of systems was negative.  There were no vitals taken for this visit. Ht '5\' 9"'$  (1.753 m)   Wt 79.4 kg   BMI 25.84 kg/m  General appearance: alert, cooperative, and appears stated age Head: Normocephalic, without obvious abnormality, atraumatic Neck: supple, symmetrical, trachea midline Lungs:  normal effort Heart: regular rate and rhythm Abdomen: soft, non-tender; bowel sounds normal; no masses,  no organomegaly Extremities: extremities normal, atraumatic, no cyanosis or edema Skin: Skin color, texture, turgor normal. No rashes or lesions   No results found for this or any previous visit (from the past 24 hour(s)). No results found.  Assessment/Plan Principal Problem:   Thickened endometrium  For dilationa nd curettage with hysteroscopy and sampling. Risks include but are not limited to bleeding, infection, injury to surrounding structures, including bowel, bladder and ureters, blood clots, and death.  Likelihood of success is high.    Kimberly Clarke 01/02/2022, 8:44 AM

## 2022-01-04 ENCOUNTER — Other Ambulatory Visit: Payer: Self-pay

## 2022-01-04 ENCOUNTER — Encounter (HOSPITAL_COMMUNITY): Payer: Self-pay | Admitting: Family Medicine

## 2022-01-04 NOTE — Progress Notes (Signed)
PCP - denies Cardiologist - denies  PPM/ICD - denies   Chest x-ray - denies EKG - 07/16/2006, pt states she had one in 2017 and it was normal. Stress Test - denies ECHO - denies Cardiac Cath - denies  CPAP - denies  DM- denies  ASA/Blood Thinner Instructions: n/a   ERAS Protcol - clears until 0715  COVID TEST- n/a  Anesthesia review: no  Patient verbally denies any shortness of breath, fever, cough and chest pain during phone call   -------------  SDW INSTRUCTIONS given:  Your procedure is scheduled on 01/05/22.  Report to [facility] Main Entrance "A" at 0750 A.M., and check in at the Admitting office.  Call this number if you have problems the morning of surgery:  [phone]   Remember:  Do not eat after midnight the night before your surgery  You may drink clear liquids until 07:15 AM the morning of your surgery.   Clear liquids allowed are: Water, Non-Citrus Juices (without pulp), Carbonated Beverages, Clear Tea, Black Coffee Only, and Gatorade    Take these medicines the morning of surgery with A SIP OF WATER  Xanax- if needed  As of today, STOP taking any Aspirin (unless otherwise instructed by your surgeon) Aleve, Naproxen, Ibuprofen, Motrin, Advil, Goody's, BC's, all herbal medications, fish oil, and all vitamins.                      Do not wear jewelry, make up, or nail polish            Do not wear lotions, powders, perfumes/colognes, or deodorant.            Do not shave 48 hours prior to surgery.  Men may shave face and neck.            Do not bring valuables to the hospital.            [facility] is not responsible for any belongings or valuables.  Do NOT Smoke (Tobacco/Vaping) 24 hours prior to your procedure If you use a CPAP at night, you may bring all equipment for your overnight stay.   Contacts, glasses, dentures or bridgework may not be worn into surgery.      For patients admitted to the hospital, discharge time will be determined by  your treatment team.   Patients discharged the day of surgery will not be allowed to drive home, and someone needs to stay with them for 24 hours.    Special instructions:   [facility]- Preparing For Surgery  Before surgery, you can play an important role. Because skin is not sterile, your skin needs to be as free of germs as possible. You can reduce the number of germs on your skin by washing with CHG (chlorahexidine gluconate) Soap before surgery.  CHG is an antiseptic cleaner which kills germs and bonds with the skin to continue killing germs even after washing.    Oral Hygiene is also important to reduce your risk of infection.  Remember - BRUSH YOUR TEETH THE MORNING OF SURGERY WITH YOUR REGULAR TOOTHPASTE  Please do not use if you have an allergy to CHG or antibacterial soaps. If your skin becomes reddened/irritated stop using the CHG.  Do not shave (including legs and underarms) for at least 48 hours prior to first CHG shower. It is OK to shave your face.  Please follow these instructions carefully.   Shower the NIGHT BEFORE SURGERY and the MORNING OF SURGERY with  DIAL Soap.   Pat yourself dry with a CLEAN TOWEL.  Wear CLEAN PAJAMAS to bed the night before surgery  Place CLEAN SHEETS on your bed the night of your first shower and DO NOT SLEEP WITH PETS.   Day of Surgery: Please shower morning of surgery  Wear Clean/Comfortable clothing the morning of surgery Do not apply any deodorants/lotions.   Remember to brush your teeth WITH YOUR REGULAR TOOTHPASTE.   Questions were answered. Patient verbalized understanding of instructions.

## 2022-01-05 ENCOUNTER — Ambulatory Visit (HOSPITAL_COMMUNITY): Payer: BC Managed Care – PPO | Admitting: Anesthesiology

## 2022-01-05 ENCOUNTER — Encounter (HOSPITAL_COMMUNITY): Payer: Self-pay | Admitting: Family Medicine

## 2022-01-05 ENCOUNTER — Other Ambulatory Visit: Payer: Self-pay

## 2022-01-05 ENCOUNTER — Ambulatory Visit (HOSPITAL_COMMUNITY)
Admission: RE | Admit: 2022-01-05 | Discharge: 2022-01-05 | Disposition: A | Discharge status: Home / Self Care | Payer: BC Managed Care – PPO | Attending: Family Medicine | Admitting: Family Medicine

## 2022-01-05 ENCOUNTER — Encounter (HOSPITAL_COMMUNITY)
Admission: RE | Disposition: A | Discharge status: Home / Self Care | Payer: Self-pay | Source: Home / Self Care | Attending: Family Medicine

## 2022-01-05 DIAGNOSIS — R9389 Abnormal findings on diagnostic imaging of other specified body structures: Secondary | ICD-10-CM | POA: Diagnosis not present

## 2022-01-05 DIAGNOSIS — N840 Polyp of corpus uteri: Secondary | ICD-10-CM

## 2022-01-05 DIAGNOSIS — Z853 Personal history of malignant neoplasm of breast: Secondary | ICD-10-CM | POA: Insufficient documentation

## 2022-01-05 DIAGNOSIS — Z79810 Long term (current) use of selective estrogen receptor modulators (SERMs): Secondary | ICD-10-CM | POA: Diagnosis not present

## 2022-01-05 HISTORY — PX: DILATATION & CURETTAGE/HYSTEROSCOPY WITH MYOSURE: SHX6511

## 2022-01-05 HISTORY — DX: Personal history of other diseases of the digestive system: Z87.19

## 2022-01-05 LAB — CBC
HCT: 38.9 % (ref 36.0–46.0)
Hemoglobin: 13.2 g/dL (ref 12.0–15.0)
MCH: 29.9 pg (ref 26.0–34.0)
MCHC: 33.9 g/dL (ref 30.0–36.0)
MCV: 88 fL (ref 80.0–100.0)
Platelets: 283 10*3/uL (ref 150–400)
RBC: 4.42 MIL/uL (ref 3.87–5.11)
RDW: 12.4 % (ref 11.5–15.5)
WBC: 7.2 10*3/uL (ref 4.0–10.5)
nRBC: 0 % (ref 0.0–0.2)

## 2022-01-05 SURGERY — DILATATION & CURETTAGE/HYSTEROSCOPY WITH MYOSURE
Anesthesia: General | Site: Vagina

## 2022-01-05 MED ORDER — CHLORHEXIDINE GLUCONATE 0.12 % MT SOLN
OROMUCOSAL | Status: AC
  Administered 2022-01-05: 15 mL via OROMUCOSAL
  Filled 2022-01-05: qty 15

## 2022-01-05 MED ORDER — SODIUM CHLORIDE 0.9 % IR SOLN
Status: DC | PRN
  Administered 2022-01-05: 3000 mL

## 2022-01-05 MED ORDER — PROPOFOL 10 MG/ML IV BOLUS
INTRAVENOUS | Status: DC | PRN
  Administered 2022-01-05: 20 mg via INTRAVENOUS
  Administered 2022-01-05: 30 mg via INTRAVENOUS
  Administered 2022-01-05: 150 mg via INTRAVENOUS

## 2022-01-05 MED ORDER — FENTANYL CITRATE (PF) 250 MCG/5ML IJ SOLN
INTRAMUSCULAR | Status: DC | PRN
Start: 2022-01-05 — End: 2022-01-05
  Administered 2022-01-05 (×2): 25 ug via INTRAVENOUS

## 2022-01-05 MED ORDER — LIDOCAINE 2% (20 MG/ML) 5 ML SYRINGE
INTRAMUSCULAR | Status: DC | PRN
  Administered 2022-01-05: 60 mg via INTRAVENOUS

## 2022-01-05 MED ORDER — LACTATED RINGERS IV SOLN: INTRAVENOUS | Status: DC

## 2022-01-05 MED ORDER — PHENYLEPHRINE 80 MCG/ML (10ML) SYRINGE FOR IV PUSH (FOR BLOOD PRESSURE SUPPORT)
PREFILLED_SYRINGE | INTRAVENOUS | Status: DC | PRN
  Administered 2022-01-05: 80 ug via INTRAVENOUS
  Administered 2022-01-05 (×2): 160 ug via INTRAVENOUS
  Administered 2022-01-05: 80 ug via INTRAVENOUS

## 2022-01-05 MED ORDER — MIDAZOLAM HCL 2 MG/2ML IJ SOLN
INTRAMUSCULAR | Status: AC
  Filled 2022-01-05: qty 2

## 2022-01-05 MED ORDER — OXYCODONE HCL 5 MG PO TABS: 5.0000 mg | ORAL_TABLET | Freq: Once | ORAL | Status: DC | PRN

## 2022-01-05 MED ORDER — DEXAMETHASONE SODIUM PHOSPHATE 10 MG/ML IJ SOLN
INTRAMUSCULAR | Status: DC | PRN
  Administered 2022-01-05: 10 mg via INTRAVENOUS

## 2022-01-05 MED ORDER — ACETAMINOPHEN 500 MG PO TABS
ORAL_TABLET | ORAL | Status: AC
  Administered 2022-01-05: 1000 mg via ORAL
  Filled 2022-01-05: qty 2

## 2022-01-05 MED ORDER — MIDAZOLAM HCL 2 MG/2ML IJ SOLN
INTRAMUSCULAR | Status: DC | PRN
  Administered 2022-01-05: 2 mg via INTRAVENOUS

## 2022-01-05 MED ORDER — LIDOCAINE-EPINEPHRINE 1 %-1:100000 IJ SOLN
INTRAMUSCULAR | Status: DC | PRN
  Administered 2022-01-05: 20 mL

## 2022-01-05 MED ORDER — ORAL CARE MOUTH RINSE
15.0000 mL | Freq: Once | OROMUCOSAL | Status: AC
Start: 2022-01-05 — End: 2022-01-05

## 2022-01-05 MED ORDER — POVIDONE-IODINE 10 % EX SWAB
2.0000 | Freq: Once | CUTANEOUS | Status: AC
  Administered 2022-01-05: 2 via TOPICAL

## 2022-01-05 MED ORDER — ONDANSETRON HCL 4 MG/2ML IJ SOLN: 4.0000 mg | Freq: Once | INTRAMUSCULAR | Status: DC | PRN

## 2022-01-05 MED ORDER — PROPOFOL 10 MG/ML IV BOLUS
INTRAVENOUS | Status: AC
  Filled 2022-01-05: qty 20

## 2022-01-05 MED ORDER — ACETAMINOPHEN 500 MG PO TABS
ORAL_TABLET | ORAL | Status: AC
  Filled 2022-01-05: qty 2

## 2022-01-05 MED ORDER — AMISULPRIDE (ANTIEMETIC) 5 MG/2ML IV SOLN: 10.0000 mg | Freq: Once | INTRAVENOUS | Status: DC | PRN

## 2022-01-05 MED ORDER — CHLORHEXIDINE GLUCONATE 0.12 % MT SOLN
15.0000 mL | Freq: Once | OROMUCOSAL | Status: AC
Start: 2022-01-05 — End: 2022-01-05

## 2022-01-05 MED ORDER — ONDANSETRON HCL 4 MG/2ML IJ SOLN
INTRAMUSCULAR | Status: DC | PRN
  Administered 2022-01-05: 4 mg via INTRAVENOUS

## 2022-01-05 MED ORDER — LIDOCAINE-EPINEPHRINE 1 %-1:100000 IJ SOLN
INTRAMUSCULAR | Status: AC
Start: 2022-01-05 — End: ?
  Filled 2022-01-05: qty 1

## 2022-01-05 MED ORDER — ACETAMINOPHEN 500 MG PO TABS: 1000.0000 mg | ORAL_TABLET | ORAL | Status: AC

## 2022-01-05 MED ORDER — GABAPENTIN 300 MG PO CAPS: 300.0000 mg | ORAL_CAPSULE | ORAL | Status: AC

## 2022-01-05 MED ORDER — GABAPENTIN 300 MG PO CAPS
ORAL_CAPSULE | ORAL | Status: AC
  Administered 2022-01-05: 300 mg via ORAL
  Filled 2022-01-05: qty 1

## 2022-01-05 MED ORDER — FENTANYL CITRATE (PF) 250 MCG/5ML IJ SOLN
INTRAMUSCULAR | Status: AC
  Filled 2022-01-05: qty 5

## 2022-01-05 MED ORDER — FENTANYL CITRATE (PF) 100 MCG/2ML IJ SOLN: 25.0000 ug | INTRAMUSCULAR | Status: DC | PRN

## 2022-01-05 MED ORDER — OXYCODONE HCL 5 MG/5ML PO SOLN: 5.0000 mg | Freq: Once | ORAL | Status: DC | PRN

## 2022-01-05 SURGICAL SUPPLY — 15 items
CANISTER SUCT 3000ML PPV (MISCELLANEOUS) ×1 IMPLANT
CATH ROBINSON RED A/P 16FR (CATHETERS) ×1 IMPLANT
DEVICE MYOSURE LITE (MISCELLANEOUS) IMPLANT
DEVICE MYOSURE REACH (MISCELLANEOUS) IMPLANT
GLOVE ECLIPSE 7.0 STRL STRAW (GLOVE) ×1 IMPLANT
GLOVE SURG UNDER POLY LF SZ7 (GLOVE) ×2 IMPLANT
GOWN STRL REUS W/ TWL LRG LVL3 (GOWN DISPOSABLE) ×2 IMPLANT
GOWN STRL REUS W/TWL LRG LVL3 (GOWN DISPOSABLE) ×2
KIT PROCEDURE FLUENT (KITS) ×1 IMPLANT
KIT TURNOVER KIT B (KITS) ×1 IMPLANT
PACK VAGINAL MINOR WOMEN LF (CUSTOM PROCEDURE TRAY) ×1 IMPLANT
PAD OB MATERNITY 4.3X12.25 (PERSONAL CARE ITEMS) ×1 IMPLANT
SEAL ROD LENS SCOPE MYOSURE (ABLATOR) ×1 IMPLANT
TOWEL GREEN STERILE FF (TOWEL DISPOSABLE) ×2 IMPLANT
UNDERPAD 30X36 HEAVY ABSORB (UNDERPADS AND DIAPERS) ×1 IMPLANT

## 2022-01-05 NOTE — Op Note (Signed)
Preoperative diagnoses:    Postoperative diagnosis: Same  Procedure: D & C, hysteroscopy  Surgeon: Standley Dakins. Kennon Rounds, MD  Anesthesia: Lidia Collum, MD  Findings:  endometrial polyp  Fluid deficit: 100  Estimated blood loss: Minimum  Pathology: Endometrial curettings  Indications for procedure: 66 y.o. G0P0000 with history of breast CA on Tamoxifen who presents for thickened endometrium for definitive sampling.   Procedure: The patient was taken to the operating room where spinal analgesia was inserted. SCDs were in place.  Time out was performed. Patient was placed in dorsolithotomy in [place]. She was prepped and draped in the usual sterile fashion. A Red Rubber catheter was used to drain her bladder. A speculum was placeed in the vagina. The cervix was visualized anteriorly and grasped with a single-tooth tenaculum. Paracervical block was performed with 1% lidocaine plain with 20 cc injected. The uterus sounded to 7 cm. Sequential dilation was performed with Kennon Rounds dilators. The hysteroscope was inserted and the endometrial cavity and inspected. There were above findings noted in the endometrial cavity with both ostia seen.  The Myosure was introduced and the polyp trimmed. 4 quadrants were sampled. The hysteroscope was removed. All instruments were removed from the vagina. All instrument, needle and lap counts were correct x 2. The patient was awakened and is recovering in stable condition.  [name] MD 01/05/2022 10:36 AM

## 2022-01-05 NOTE — Anesthesia Preprocedure Evaluation (Signed)
Anesthesia Evaluation  Patient identified by MRN, date of birth, ID band Patient awake    Reviewed: Allergy & Precautions, NPO status , Patient's Chart, lab work & pertinent test results  History of Anesthesia Complications Negative for: history of anesthetic complications  Airway Mallampati: II  TM Distance: >3 FB Neck ROM: Full    Dental  (+) Dental Advisory Given, Teeth Intact   Pulmonary neg pulmonary ROS,    Pulmonary exam normal        Cardiovascular negative cardio ROS Normal cardiovascular exam     Neuro/Psych negative neurological ROS     GI/Hepatic Neg liver ROS, hiatal hernia,   Endo/Other  negative endocrine ROS  Renal/GU negative Renal ROS  negative genitourinary   Musculoskeletal negative musculoskeletal ROS (+)   Abdominal   Peds  Hematology negative hematology ROS (+)   Anesthesia Other Findings   Reproductive/Obstetrics Thickened endometrium Polyp                             Anesthesia Physical Anesthesia Plan  ASA: 2  Anesthesia Plan: General   Post-op Pain Management: Tylenol PO (pre-op)* and Toradol IV (intra-op)*   Induction: Intravenous  PONV Risk Score and Plan: 3 and Ondansetron, Dexamethasone, Midazolam and Treatment may vary due to age or medical condition  Airway Management Planned: LMA  Additional Equipment: None  Intra-op Plan:   Post-operative Plan: Extubation in OR  Informed Consent: I have reviewed the patients History and Physical, chart, labs and discussed the procedure including the risks, benefits and alternatives for the proposed anesthesia with the patient or authorized representative who has indicated his/her understanding and acceptance.     Dental advisory given  Plan Discussed with:   Anesthesia Plan Comments:         Anesthesia Quick Evaluation

## 2022-01-05 NOTE — Interval H&P Note (Signed)
History and Physical Interval Note:  01/05/2022 8:17 AM  Kimberly Clarke  has presented today for surgery, with the diagnosis of Thickened Endometrium Polyp.  The various methods of treatment have been discussed with the patient and family. After consideration of risks, benefits and other options for treatment, the patient has consented to  Procedure(s): [place] (N/A) as a surgical intervention.  The patient's history has been reviewed, patient examined, no change in status, stable for surgery.  I have reviewed the patient's chart and labs.  Questions were answered to the patient's satisfaction.     [name]

## 2022-01-05 NOTE — Transfer of Care (Signed)
Immediate Anesthesia Transfer of Care Note  Patient: Kimberly Clarke  Procedure(s) Performed: DILATATION & CURETTAGE/HYSTEROSCOPY WITH MYOSURE (Vagina )  Patient Location: PACU  Anesthesia Type:General  Level of Consciousness: awake, alert  and oriented  Airway & Oxygen Therapy: Patient Spontanous Breathing and Patient connected to nasal cannula oxygen  Post-op Assessment: Report given to RN and Post -op Vital signs reviewed and stable  Post vital signs: Reviewed and stable  Last Vitals:  Vitals Value Taken Time  BP 131/81 01/05/22 1049  Temp    Pulse 97 01/05/22 1052  Resp 18 01/05/22 1052  SpO2 99 % 01/05/22 1052  Vitals shown include unvalidated device data.  Last Pain:  Vitals:   01/05/22 0851  TempSrc:   PainSc: 0-No pain         Complications: No notable events documented.

## 2022-01-05 NOTE — Anesthesia Procedure Notes (Signed)
Procedure Name: LMA Insertion Date/Time: 01/05/2022 9:57 AM  Performed by: Dorthea Cove, CRNAPre-anesthesia Checklist: Patient identified, Emergency Drugs available, Suction available and Patient being monitored Patient Re-evaluated:Patient Re-evaluated prior to induction Oxygen Delivery Method: Circle System Utilized Preoxygenation: Pre-oxygenation with 100% oxygen Induction Type: IV induction Ventilation: Mask ventilation without difficulty LMA: LMA inserted LMA Size: 4.0 Number of attempts: 1 Airway Equipment and Method: Bite block Placement Confirmation: positive ETCO2 Tube secured with: Tape Dental Injury: Teeth and Oropharynx as per pre-operative assessment

## 2022-01-06 ENCOUNTER — Encounter (HOSPITAL_COMMUNITY): Payer: Self-pay | Admitting: Family Medicine

## 2022-01-06 LAB — SURGICAL PATHOLOGY

## 2022-01-06 NOTE — Anesthesia Postprocedure Evaluation (Signed)
Anesthesia Post Note  Patient: Kimberly Clarke  Procedure(s) Performed: DILATATION & CURETTAGE/HYSTEROSCOPY WITH MYOSURE (Vagina )     Patient location during evaluation: PACU Anesthesia Type: General Level of consciousness: awake and alert Pain management: pain level controlled Vital Signs Assessment: post-procedure vital signs reviewed and stable Respiratory status: spontaneous breathing, nonlabored ventilation and respiratory function stable Cardiovascular status: blood pressure returned to baseline and stable Postop Assessment: no apparent nausea or vomiting Anesthetic complications: no   No notable events documented.  Last Vitals:  Vitals:   01/05/22 1115 01/05/22 1130  BP: 133/82 132/88  Pulse: 82 75  Resp: 15 14  Temp:  36.9 C  SpO2: 98% 98%    Last Pain:  Vitals:   01/05/22 1130  TempSrc:   PainSc: 0-No pain   Pain Goal:                   Lidia Collum

## 2022-01-18 ENCOUNTER — Ambulatory Visit (INDEPENDENT_AMBULATORY_CARE_PROVIDER_SITE_OTHER): Payer: BC Managed Care – PPO | Admitting: Family Medicine

## 2022-01-18 ENCOUNTER — Encounter: Payer: Self-pay | Admitting: Family Medicine

## 2022-01-18 VITALS — BP 142/87 | HR 82

## 2022-01-18 DIAGNOSIS — Z09 Encounter for follow-up examination after completed treatment for conditions other than malignant neoplasm: Secondary | ICD-10-CM

## 2022-01-18 DIAGNOSIS — R9389 Abnormal findings on diagnostic imaging of other specified body structures: Secondary | ICD-10-CM

## 2022-01-18 NOTE — Progress Notes (Signed)
   Subjective:    Patient ID: Kimberly Clarke is a 66 y.o. female presenting with Post-op Follow-up  on 01/18/2022  HPI: S/p D & C with hysteroscopic removal of polyp of endometrium. Benign path. Has had a little bit of spotting.   Review of Systems  Constitutional:  Negative for chills and fever.  Respiratory:  Negative for shortness of breath.   Cardiovascular:  Negative for chest pain.  Gastrointestinal:  Negative for abdominal pain, nausea and vomiting.  Genitourinary:  Negative for dysuria.  Skin:  Negative for rash.      Objective:    BP (!) 142/87   Pulse 82  Physical Exam Exam conducted with a chaperone present.  Constitutional:      General: She is not in acute distress.    Appearance: She is well-developed.  HENT:     Head: Normocephalic and atraumatic.  Eyes:     General: No scleral icterus. Cardiovascular:     Rate and Rhythm: Normal rate.  Pulmonary:     Effort: Pulmonary effort is normal.  Abdominal:     Palpations: Abdomen is soft.  Musculoskeletal:     Cervical back: Neck supple.  Skin:    General: Skin is warm and dry.  Neurological:     Mental Status: She is alert and oriented to person, place, and time.         Assessment & Plan:  Postop check - Reviewed images and pathology--no other endometrial pathology noted.  Return if symptoms worsen or fail to improve.  [name], MD 01/18/2022 4:34 PM

## 2022-01-19 DIAGNOSIS — R140 Abdominal distension (gaseous): Secondary | ICD-10-CM | POA: Diagnosis not present

## 2022-01-25 DIAGNOSIS — H5213 Myopia, bilateral: Secondary | ICD-10-CM | POA: Diagnosis not present

## 2022-02-15 ENCOUNTER — Encounter: Payer: Self-pay | Admitting: Nurse Practitioner

## 2022-03-17 ENCOUNTER — Ambulatory Visit
Admission: RE | Admit: 2022-03-17 | Discharge: 2022-03-17 | Disposition: A | Discharge status: Home / Self Care | Payer: BC Managed Care – PPO | Source: Ambulatory Visit | Attending: Nurse Practitioner | Admitting: Nurse Practitioner

## 2022-03-17 DIAGNOSIS — Z170 Estrogen receptor positive status [ER+]: Secondary | ICD-10-CM | POA: Insufficient documentation

## 2022-03-17 DIAGNOSIS — Z1231 Encounter for screening mammogram for malignant neoplasm of breast: Secondary | ICD-10-CM | POA: Insufficient documentation

## 2022-03-17 DIAGNOSIS — C50411 Malignant neoplasm of upper-outer quadrant of right female breast: Secondary | ICD-10-CM | POA: Diagnosis not present

## 2022-03-20 ENCOUNTER — Inpatient Hospital Stay: Payer: BC Managed Care – PPO | Attending: Oncology

## 2022-03-20 ENCOUNTER — Other Ambulatory Visit: Payer: Self-pay

## 2022-03-20 ENCOUNTER — Inpatient Hospital Stay: Payer: BC Managed Care – PPO | Admitting: Nurse Practitioner

## 2022-03-20 ENCOUNTER — Encounter: Payer: Self-pay | Admitting: Nurse Practitioner

## 2022-03-20 VITALS — BP 146/101 | HR 84 | Temp 99.0°F | Resp 17 | Wt 176.0 lb

## 2022-03-20 DIAGNOSIS — F419 Anxiety disorder, unspecified: Secondary | ICD-10-CM | POA: Insufficient documentation

## 2022-03-20 DIAGNOSIS — Z08 Encounter for follow-up examination after completed treatment for malignant neoplasm: Secondary | ICD-10-CM

## 2022-03-20 DIAGNOSIS — M8580 Other specified disorders of bone density and structure, unspecified site: Secondary | ICD-10-CM

## 2022-03-20 DIAGNOSIS — Z5181 Encounter for therapeutic drug level monitoring: Secondary | ICD-10-CM | POA: Diagnosis not present

## 2022-03-20 DIAGNOSIS — G479 Sleep disorder, unspecified: Secondary | ICD-10-CM | POA: Diagnosis not present

## 2022-03-20 DIAGNOSIS — I10 Essential (primary) hypertension: Secondary | ICD-10-CM | POA: Diagnosis not present

## 2022-03-20 DIAGNOSIS — Z170 Estrogen receptor positive status [ER+]: Secondary | ICD-10-CM | POA: Insufficient documentation

## 2022-03-20 DIAGNOSIS — C50411 Malignant neoplasm of upper-outer quadrant of right female breast: Secondary | ICD-10-CM | POA: Insufficient documentation

## 2022-03-20 DIAGNOSIS — Z853 Personal history of malignant neoplasm of breast: Secondary | ICD-10-CM

## 2022-03-20 DIAGNOSIS — Z79811 Long term (current) use of aromatase inhibitors: Secondary | ICD-10-CM | POA: Diagnosis not present

## 2022-03-20 LAB — COMPREHENSIVE METABOLIC PANEL
ALT: 24 U/L (ref 0–44)
AST: 26 U/L (ref 15–41)
Albumin: 4.1 g/dL (ref 3.5–5.0)
Alkaline Phosphatase: 91 U/L (ref 38–126)
Anion gap: 6 (ref 5–15)
BUN: 11 mg/dL (ref 8–23)
CO2: 25 mmol/L (ref 22–32)
Calcium: 9 mg/dL (ref 8.9–10.3)
Chloride: 106 mmol/L (ref 98–111)
Creatinine, Ser: 0.72 mg/dL (ref 0.44–1.00)
GFR, Estimated: >60 mL/min (ref >60)
Glucose, Bld: 85 mg/dL (ref 70–99)
Potassium: 3.7 mmol/L (ref 3.5–5.1)
Sodium: 137 mmol/L (ref 135–145)
Total Bilirubin: 0.4 mg/dL (ref 0.3–1.2)
Total Protein: 7.6 g/dL (ref 6.5–8.1)

## 2022-03-20 LAB — CBC WITH DIFFERENTIAL/PLATELET
Abs Immature Granulocytes: 0.02 10*3/uL (ref 0.00–0.07)
Basophils Absolute: 0.1 10*3/uL (ref 0.0–0.1)
Basophils Relative: 1 %
Eosinophils Absolute: 0.2 10*3/uL (ref 0.0–0.5)
Eosinophils Relative: 4 %
HCT: 40.8 % (ref 36.0–46.0)
Hemoglobin: 13.6 g/dL (ref 12.0–15.0)
Immature Granulocytes: 0 %
Lymphocytes Relative: 21 %
Lymphs Abs: 1.4 10*3/uL (ref 0.7–4.0)
MCH: 29.4 pg (ref 26.0–34.0)
MCHC: 33.3 g/dL (ref 30.0–36.0)
MCV: 88.1 fL (ref 80.0–100.0)
Monocytes Absolute: 0.6 10*3/uL (ref 0.1–1.0)
Monocytes Relative: 8 %
Neutro Abs: 4.6 10*3/uL (ref 1.7–7.7)
Neutrophils Relative %: 66 %
Platelets: 302 10*3/uL (ref 150–400)
RBC: 4.63 MIL/uL (ref 3.87–5.11)
RDW: 12.6 % (ref 11.5–15.5)
WBC: 6.9 10*3/uL (ref 4.0–10.5)
nRBC: 0 % (ref 0.0–0.2)

## 2022-03-20 MED ORDER — ALENDRONATE SODIUM 70 MG PO TABS: 70.0000 mg | ORAL_TABLET | ORAL | 1 refills | Status: DC

## 2022-03-20 MED ORDER — ANASTROZOLE 1 MG PO TABS: 1.0000 mg | ORAL_TABLET | Freq: Every day | ORAL | 1 refills | Status: DC

## 2022-03-20 NOTE — Progress Notes (Signed)
[place]  [phone] [phone]  ID: Kimberly Clarke OB: November 23, 1955  MR#: [id number]  EHO#:[id number]  Patient Care Team: Pcp, No as PCP - General [place] Carbo, Alyssa Grove, RN (Inactive) as Oncology Nurse Navigator Tamala Julian, Hillery [name], MD (Inactive) as Referring Physician (Surgery) Lloyd Huger, MD as Consulting Physician (Oncology) Clent Jacks, RN as Registered Nurse Ali Lowe, Ellwood Dense, MD as Referring Physician (Radiation Oncology) Jacquelin Hawking, NP as Nurse Practitioner (Oncology)  CHIEF COMPLAINT: Pathologic stage IIa ER/PR positive, HER-2 negative invasive carcinoma of the upper outer quadrant of the right breast  INTERVAL HISTORY: Patient is 66 year old female who returns to clinic for rotuine follow up and continued surveillance of breast cancer. She continues to tolerate anastrozole well. Is reluctant to stop d/t close friends being diagnosed with breast cancer recently. Her husband is again up for potential promotion at work and there may be another change with her insurance. She has adopted a great pyrenees puppy from animal shelter who keeps her very active and is also considering adopting 2 more kittens. She's remodeling her attic. She feels at baseline and denies complaints. Tolerating fosamax well. GI symptoms are better controleld with miralax. No breast concerns.   REVIEW OF SYSTEMS:   Review of Systems  Constitutional:  Negative for chills, fever, malaise/fatigue and weight loss.  HENT:  Negative for congestion, hearing loss, nosebleeds, sore throat and tinnitus.   Eyes:  Negative for blurred vision and double vision.  Respiratory:  Negative for cough, hemoptysis, shortness of breath and wheezing.   Cardiovascular:  Negative for chest pain, palpitations and leg swelling.  Gastrointestinal:  Negative for abdominal pain, blood in stool, constipation, diarrhea, melena, nausea and vomiting.  Genitourinary:  Negative for  dysuria and urgency.  Musculoskeletal:  Negative for back pain, falls, joint pain and myalgias.  Skin:  Negative for itching and rash.  Neurological:  Negative for dizziness, tingling, sensory change, loss of consciousness, weakness and headaches.  Endo/Heme/Allergies:  Negative for environmental allergies. Does not bruise/bleed easily.  Psychiatric/Behavioral:  Negative for depression. The patient is nervous/anxious. The patient does not have insomnia.   As per HPI. Otherwise, a complete review of systems is negative.  PAST MEDICAL HISTORY: Past Medical History:  Diagnosis Date   Anxiety    Cancer ([place]) 04/24/2016   right breast.  chemo finished 09/2016   History of hiatal hernia    HSV infection    HISTORY OF HSV   Osteopenia    Personal history of chemotherapy    Personal history of radiation therapy    PONV (postoperative nausea and vomiting)    nausea no vomiting   Post-menopausal 10/20/2005   Postmenopause bleeding    Spasmodic dysphonia    Thickened endometrium 05/30/2016    PAST SURGICAL HISTORY: Past Surgical History:  Procedure Laterality Date   ANAL FISTULECTOMY     BREAST BIOPSY Left 02/05/2019   Stereo, Cora Daniels,  benign   BREAST LUMPECTOMY Right 11/30/2016   lumpectomy with rad and neoadj chemo   DILATATION & CURETTAGE/HYSTEROSCOPY WITH MYOSURE N/A 01/05/2022   Procedure: [place];  Surgeon: [name], MD;  Location: [place];  Service: Gynecology;  Laterality: N/A;   ENDOMETRIAL BIOPSY     KNEE SURGERY     snow skating   PARTIAL MASTECTOMY WITH NEEDLE LOCALIZATION Right 11/30/2016   Procedure: PARTIAL MASTECTOMY WITH NEEDLE LOCALIZATION;  Surgeon: Leonie Green, MD;  Location: [facility];  Service: General;  Laterality:  Right;   PORTACATH PLACEMENT N/A 05/23/2016   Procedure: INSERTION PORT-A-CATH;  Surgeon: Leonie Green, MD;  Location: [facility];  Service: General;  Laterality: N/A;   removal of port a cath      01/2018   SENTINEL NODE BIOPSY Right 11/30/2016   Procedure: SENTINEL NODE BIOPSY;  Surgeon: Leonie Green, MD;  Location: [facility];  Service: General;  Laterality: Right;    FAMILY HISTORY: Family History  Problem Relation Age of Onset   Cancer Mother        BREAST AND BONES   Breast cancer Mother 16   Hearing loss Mother    Emphysema Father    Cancer Father    Alcohol abuse Father    Cancer Maternal Aunt    Hearing loss Maternal Grandmother    Hypertension Sister    Miscarriages / [place] Sister     ADVANCED DIRECTIVES (Y/N):  N  HEALTH MAINTENANCE: Social History   Tobacco Use   Smoking status: Never   Smokeless tobacco: Never  Vaping Use   Vaping Use: Never used  Substance Use Topics   Alcohol use: Yes    Alcohol/week: 6.0 - 8.0 standard drinks of alcohol    Types: 3 - 4 Glasses of wine, 3 - 4 Cans of beer per week    Comment: WEEKENDS OCC   Drug use: No     Colonoscopy:  PAP:  Bone density:  Lipid panel:  Allergies  Allergen Reactions   Penicillins Anaphylaxis    Per pt rxn as kid ? Unsure of reaction     Current Outpatient Medications  Medication Sig Dispense Refill   alendronate (FOSAMAX) 70 MG tablet Take 1 tablet (70 mg total) by mouth once a week. Take with a full glass of water on an empty stomach.For osteopenia. (Patient taking differently: Take 70 mg by mouth every Monday. Take with a full glass of water on an empty stomach.For osteopenia.) 4 tablet 2   ALPRAZolam (XANAX) 0.25 MG tablet TAKE 1/2 TABLETS (0.125 MG TOTAL) BY MOUTH 2 (TWO) TIMES DAILY AS NEEDED FOR ANXIETY. (Patient taking differently: Take 0.125 mg by mouth 2 (two) times daily as needed for anxiety.) 30 tablet 2   anastrozole (ARIMIDEX) 1 MG tablet Take 1 tablet (1 mg total) by mouth daily. 90 tablet 1   BIOTIN PO Take 1 tablet by mouth daily.      calcium citrate-vitamin D (CITRACAL+D) 315-200 MG-UNIT tablet Take 2-3 tablets by mouth daily.     carboxymethylcellulose  (REFRESH TEARS) 0.5 % SOLN Place 1 drop into both eyes at bedtime.     docusate sodium (COLACE) 100 MG capsule Take 200 mg by mouth at bedtime.     Multiple Vitamins-Minerals (MULTIVITAMIN ADULTS 50+) TABS Take 1 tablet by mouth daily.     polycarbophil (FIBERCON) 625 MG tablet Take 1,250 mg by mouth at bedtime.      No current facility-administered medications for this visit.    OBJECTIVE: Vitals:   03/20/22 1600  BP: (!) 146/101  Pulse: 84  Resp: 17  Temp: 99 F (37.2 C)  SpO2: 99%     Body mass index is 25.99 kg/m.    ECOG FS:0 - Asymptomatic  Physical Exam Vitals reviewed.  Constitutional:      General: She is not in acute distress.    Appearance: She is not ill-appearing.  Cardiovascular:     Rate and Rhythm: Normal rate and regular rhythm.  Pulmonary:     Effort: No respiratory  distress.  Abdominal:     General: There is no distension.  Musculoskeletal:        General: No deformity.  Skin:    General: Skin is dry.     Coloration: Skin is not pale.  Neurological:     Mental Status: She is alert and oriented to person, place, and time.  Psychiatric:        Mood and Affect: Mood normal.        Behavior: Behavior normal.   Breast: declined  LAB RESULTS:  Lab Results  Component Value Date   NA 137 03/20/2022   K 3.7 03/20/2022   CL 106 03/20/2022   CO2 25 03/20/2022   GLUCOSE 85 03/20/2022   BUN 11 03/20/2022   CREATININE 0.72 03/20/2022   CALCIUM 9.0 03/20/2022   PROT 7.6 03/20/2022   ALBUMIN 4.1 03/20/2022   AST 26 03/20/2022   ALT 24 03/20/2022   ALKPHOS 91 03/20/2022   BILITOT 0.4 03/20/2022   GFRNONAA >60 03/20/2022   GFRAA >60 10/12/2016    Lab Results  Component Value Date   WBC 6.9 03/20/2022   NEUTROABS 4.6 03/20/2022   HGB 13.6 03/20/2022   HCT 40.8 03/20/2022   MCV 88.1 03/20/2022   PLT 302 03/20/2022     STUDIES: No results found.  ASSESSMENT: Pathologic stage IIa ER/PR positive, HER-2 negative invasive carcinoma of the  upper outer quadrant of the right breast.  PLAN:    1. Pathologic stage IIa ER/PR positive, HER-2 negative invasive carcinoma of the upper outer quadrant of the right breast: s/p neoadjuvant chemo 10/12/16 followed by lumpectomy 11/30/16 and completed adjuvant XRT (at [facility]) 02/2017. Unable to tolerate letrozole, switched to anastrozole. Plan was to complete 5 years of adjuvant treatment in October 2023 given HR+ disease. She's reluctant to stop AI treatment for fear of recurrence. Ok to continue for another 6 months. Could consider BCI testing which we reviewed in detail today though concern for financial implication. Potentially new insurance starting with husband's potential job change. Mammogram from 03/17/22 pending results. Sh ehas a hx of architectural distortion in right breast. Will continue annual mammograms. We will see her in 6 months for continued surveillance then consider transitioning to yearly visits or follow up with PCP if Dr Kennon Rounds is willing to provide long term surveillance.   2.  History of endometrial thickening:  -s/p pelvic ultrasound. Endometrium measured 11 mm, heterogeneous. Also noted to have cyst of ovaries. Underwent endometrial biopsy which was consistent with polyp. She is now s/p D&C with Dr. Kennon Rounds which was benign.   3.  Osteopenia:  - Bone mineral density 2020- T score -1.5, 07/23/2019 T score of -1.7. 07/27/20- T score worsened to -2.1.  - Consistent with osteopenia which is likely secondary to AI use. - Currently on fosamax 70 mg once weekly. Plan to repeat bone density in one year which she did not have done. Will get bone density rescheduled. Continue fosamax. Labs reviewed and reassuring. Tolerating well. Refilled.  - Continue calcium 1200 mg and vitamin D 1000 iu supplementation.    4. Sleep problems: - continue melatonin - does not complain of this today  5. Weight gain: - weight is stable  6. Thyroid screening - completed with Dr Kennon Rounds - no comlpaints  today  7. Health Management - encouraged her to follow up with Dr Kennon Rounds for ongoing health maintenance.  8. Anxiety - defer refills of alprazolam to Dr. Kennon Rounds who has been managing medication previously.  9. Hypertension - BP elevated in clinic today. Worse than previous. Asymptomatic. Per patient, possible white coat syndrome. Encouraged her to monitor pressure at home and reach out to pcp for management if persistently elevated.   Disposition: -  schedule bone density - 6 mo- labs (cbc, cmp), Dr. Grayland Ormond  Patient expressed understanding and was in agreement with this plan. She also understands that She can call clinic at any time with any questions, concerns, or complaints.    Cancer Staging  Malignant neoplasm of upper-outer quadrant of female breast [facility]) Staging form: Breast, AJCC 7th Edition - Clinical stage from 04/26/2016: Stage IIA (T1c, N1, M0) - Signed by Lloyd Huger, MD on 05/25/2016 Laterality: Right Estrogen receptor status: Positive Progesterone receptor status: Positive HER2 status: Negative - Pathologic stage from 12/15/2016: Stage IIA (yT1c, N1a, cM0) - Signed by Lloyd Huger, MD on 12/15/2016 Stage prefix: Post-therapy Laterality: Right Estrogen receptor status: Positive Progesterone receptor status: Positive HER2 status: Negative  Verlon Au, NP 03/20/22   CC: Dr. Kennon Rounds

## 2022-03-20 NOTE — Progress Notes (Signed)
Patient here for oncology follow-up appointment, expresses no new complaints or concerns at this time.   

## 2022-03-21 ENCOUNTER — Encounter: Payer: Self-pay | Admitting: Nurse Practitioner

## 2022-03-22 ENCOUNTER — Other Ambulatory Visit: Payer: Self-pay

## 2022-04-03 ENCOUNTER — Encounter: Payer: Self-pay | Admitting: Nurse Practitioner

## 2022-06-06 ENCOUNTER — Other Ambulatory Visit: Payer: BC Managed Care – PPO

## 2022-06-13 ENCOUNTER — Encounter: Payer: Self-pay | Admitting: Nurse Practitioner

## 2022-07-13 ENCOUNTER — Ambulatory Visit
Admission: RE | Admit: 2022-07-13 | Discharge: 2022-07-13 | Disposition: A | Discharge status: Home / Self Care | Payer: 59 | Source: Ambulatory Visit | Attending: Nurse Practitioner | Admitting: Nurse Practitioner

## 2022-07-13 DIAGNOSIS — M8580 Other specified disorders of bone density and structure, unspecified site: Secondary | ICD-10-CM | POA: Insufficient documentation

## 2022-09-20 ENCOUNTER — Other Ambulatory Visit: Payer: BC Managed Care – PPO

## 2022-09-20 ENCOUNTER — Ambulatory Visit: Payer: BC Managed Care – PPO | Admitting: Oncology

## 2022-09-20 ENCOUNTER — Other Ambulatory Visit: Payer: Self-pay

## 2022-09-20 DIAGNOSIS — Z5181 Encounter for therapeutic drug level monitoring: Secondary | ICD-10-CM

## 2022-09-21 ENCOUNTER — Encounter: Payer: Self-pay | Admitting: Oncology

## 2022-09-21 ENCOUNTER — Inpatient Hospital Stay: Payer: 59 | Admitting: Oncology

## 2022-09-21 ENCOUNTER — Inpatient Hospital Stay: Payer: 59 | Attending: Oncology

## 2022-09-21 VITALS — BP 142/100 | HR 90 | Temp 98.5°F | Wt 175.5 lb

## 2022-09-21 DIAGNOSIS — Z08 Encounter for follow-up examination after completed treatment for malignant neoplasm: Secondary | ICD-10-CM

## 2022-09-21 DIAGNOSIS — Z853 Personal history of malignant neoplasm of breast: Secondary | ICD-10-CM

## 2022-09-21 DIAGNOSIS — C50411 Malignant neoplasm of upper-outer quadrant of right female breast: Secondary | ICD-10-CM | POA: Diagnosis present

## 2022-09-21 DIAGNOSIS — Z1231 Encounter for screening mammogram for malignant neoplasm of breast: Secondary | ICD-10-CM | POA: Diagnosis not present

## 2022-09-21 DIAGNOSIS — Z79811 Long term (current) use of aromatase inhibitors: Secondary | ICD-10-CM | POA: Diagnosis not present

## 2022-09-21 DIAGNOSIS — Z5181 Encounter for therapeutic drug level monitoring: Secondary | ICD-10-CM

## 2022-09-21 DIAGNOSIS — Z923 Personal history of irradiation: Secondary | ICD-10-CM | POA: Diagnosis not present

## 2022-09-21 DIAGNOSIS — M8580 Other specified disorders of bone density and structure, unspecified site: Secondary | ICD-10-CM | POA: Insufficient documentation

## 2022-09-21 DIAGNOSIS — Z170 Estrogen receptor positive status [ER+]: Secondary | ICD-10-CM | POA: Diagnosis not present

## 2022-09-21 DIAGNOSIS — G629 Polyneuropathy, unspecified: Secondary | ICD-10-CM | POA: Diagnosis not present

## 2022-09-21 LAB — CBC (CANCER CENTER ONLY)
HCT: 40.5 % (ref 36.0–46.0)
Hemoglobin: 13.5 g/dL (ref 12.0–15.0)
MCH: 29.3 pg (ref 26.0–34.0)
MCHC: 33.3 g/dL (ref 30.0–36.0)
MCV: 88 fL (ref 80.0–100.0)
Platelet Count: 319 10*3/uL (ref 150–400)
RBC: 4.6 MIL/uL (ref 3.87–5.11)
RDW: 12.7 % (ref 11.5–15.5)
WBC Count: 6.4 10*3/uL (ref 4.0–10.5)
nRBC: 0 % (ref 0.0–0.2)

## 2022-09-21 LAB — CMP (CANCER CENTER ONLY)
ALT: 22 U/L (ref 0–44)
AST: 26 U/L (ref 15–41)
Albumin: 4.1 g/dL (ref 3.5–5.0)
Alkaline Phosphatase: 87 U/L (ref 38–126)
Anion gap: 8 (ref 5–15)
BUN: 13 mg/dL (ref 8–23)
CO2: 26 mmol/L (ref 22–32)
Calcium: 9.4 mg/dL (ref 8.9–10.3)
Chloride: 107 mmol/L (ref 98–111)
Creatinine: 0.81 mg/dL (ref 0.44–1.00)
GFR, Estimated: >60 mL/min (ref >60)
Glucose, Bld: 81 mg/dL (ref 70–99)
Potassium: 3.8 mmol/L (ref 3.5–5.1)
Sodium: 141 mmol/L (ref 135–145)
Total Bilirubin: 0.5 mg/dL (ref 0.3–1.2)
Total Protein: 7.6 g/dL (ref 6.5–8.1)

## 2022-09-21 NOTE — Progress Notes (Signed)
Pt. Would like you to review her Bone Density scan that was done on 07/13/2022.

## 2022-09-21 NOTE — Progress Notes (Signed)
[facility]  [phone]) [phone] [phone]) [phone]  ID: Kimberly Clarke OB: 04-29-1956  MR#: [id number]  AOZ#:[id number]  Patient Care Team: Reva Bores, MD as PCP - General (Obstetrics and Gynecology) Nadeen Landau, MD (Inactive) as Referring Physician (Surgery) Jeralyn Ruths, MD as Consulting Physician (Oncology) Benita Gutter, RN as Registered Nurse Lucrezia Europe, Dickey Gave, MD as Referring Physician (Radiation Oncology)   CHIEF COMPLAINT: Pathologic stage IIa ER/PR positive, HER-2 negative invasive carcinoma of the upper outer quadrant of the right breast.  INTERVAL HISTORY: Patient returns to clinic today for routine 75-month evaluation.  She currently feels well and is asymptomatic.  She is tolerating anastrozole well without significant side effects. She has no neurologic complaints.  She denies any recent fevers or illnesses.  She has a good appetite.  She denies any chest pain, shortness of breath, cough, or hemoptysis.  She denies any nausea, vomiting, constipation, or diarrhea. She has no urinary complaints.  Patient offers no specific complaints today.  REVIEW OF SYSTEMS:   Review of Systems  Constitutional: Negative.  Negative for fever, malaise/fatigue and weight loss.  HENT:  Negative for congestion and sinus pain.   Respiratory: Negative.  Negative for cough and shortness of breath.   Cardiovascular: Negative.  Negative for chest pain and leg swelling.  Gastrointestinal:  Negative for abdominal pain, constipation, diarrhea, nausea and vomiting.  Genitourinary: Negative.  Negative for dysuria.  Musculoskeletal: Negative.  Negative for joint pain.  Skin: Negative.  Negative for rash.  Neurological: Negative.  Negative for tingling, sensory change and weakness.  Psychiatric/Behavioral: Negative.  Negative for depression. The patient is not nervous/anxious and does not have insomnia.     As per HPI. Otherwise, a complete review of  systems is negative.  PAST MEDICAL HISTORY: Past Medical History:  Diagnosis Date   Anxiety    Cancer ([facility]) 04/24/2016   right breast.  chemo finished 09/2016   History of hiatal hernia    HSV infection    HISTORY OF HSV   Osteopenia    Personal history of chemotherapy    Personal history of radiation therapy    PONV (postoperative nausea and vomiting)    nausea no vomiting   Post-menopausal 10/20/2005   Postmenopause bleeding    Spasmodic dysphonia    Thickened endometrium 05/30/2016    PAST SURGICAL HISTORY: Past Surgical History:  Procedure Laterality Date   ANAL FISTULECTOMY     BREAST BIOPSY Left 02/05/2019   Stereo, Rondell Reams,  benign   BREAST LUMPECTOMY Right 11/30/2016   lumpectomy with rad and neoadj chemo   DILATATION & CURETTAGE/HYSTEROSCOPY WITH MYOSURE N/A 01/05/2022   Procedure: DILATATION & CURETTAGE/HYSTEROSCOPY WITH MYOSURE;  Surgeon: Reva Bores, MD;  Location: [facility];  Service: Gynecology;  Laterality: N/A;   ENDOMETRIAL BIOPSY     KNEE SURGERY     snow skating   PARTIAL MASTECTOMY WITH NEEDLE LOCALIZATION Right 11/30/2016   Procedure: PARTIAL MASTECTOMY WITH NEEDLE LOCALIZATION;  Surgeon: Nadeen Landau, MD;  Location: [facility];  Service: General;  Laterality: Right;   PORTACATH PLACEMENT N/A 05/23/2016   Procedure: INSERTION PORT-A-CATH;  Surgeon: Nadeen Landau, MD;  Location: [facility];  Service: General;  Laterality: N/A;   removal of port a cath     01/2018   SENTINEL NODE BIOPSY Right 11/30/2016   Procedure: SENTINEL NODE BIOPSY;  Surgeon: Nadeen Landau, MD;  Location: [facility];  Service: General;  Laterality: Right;    FAMILY HISTORY: Family  History  Problem Relation Age of Onset   Cancer Mother        BREAST AND BONES   Breast cancer Mother 37   Hearing loss Mother    Emphysema Father    Cancer Father    Alcohol abuse Father    Cancer Maternal Aunt    Hearing loss Maternal Grandmother    Hypertension Sister    Miscarriages  / [place] Sister     ADVANCED DIRECTIVES (Y/N):  N  HEALTH MAINTENANCE: Social History   Tobacco Use   Smoking status: Never   Smokeless tobacco: Never  Vaping Use   Vaping Use: Never used  Substance Use Topics   Alcohol use: Yes    Alcohol/week: 6.0 - 8.0 standard drinks of alcohol    Types: 3 - 4 Glasses of wine, 3 - 4 Cans of beer per week    Comment: WEEKENDS OCC   Drug use: No     Colonoscopy:  PAP:  Bone density:  Lipid panel:  Allergies  Allergen Reactions   Penicillins Anaphylaxis    Per pt rxn as kid ? Unsure of reaction     Current Outpatient Medications  Medication Sig Dispense Refill   alendronate (FOSAMAX) 70 MG tablet Take 1 tablet (70 mg total) by mouth every Monday. Take with a full glass of water on an empty stomach.For osteopenia. 12 tablet 1   ALPRAZolam (XANAX) 0.25 MG tablet TAKE 1/2 TABLETS (0.125 MG TOTAL) BY MOUTH 2 (TWO) TIMES DAILY AS NEEDED FOR ANXIETY. (Patient taking differently: Take 0.125 mg by mouth 2 (two) times daily as needed for anxiety.) 30 tablet 2   anastrozole (ARIMIDEX) 1 MG tablet Take 1 tablet (1 mg total) by mouth daily. 90 tablet 1   BIOTIN PO Take 1 tablet by mouth daily.      calcium citrate-vitamin D (CITRACAL+D) 315-200 MG-UNIT tablet Take 2-3 tablets by mouth daily.     carboxymethylcellulose (REFRESH TEARS) 0.5 % SOLN Place 1 drop into both eyes at bedtime.     docusate sodium (COLACE) 100 MG capsule Take 200 mg by mouth at bedtime.     Multiple Vitamins-Minerals (MULTIVITAMIN ADULTS 50+) TABS Take 1 tablet by mouth daily.     polycarbophil (FIBERCON) 625 MG tablet Take 1,250 mg by mouth at bedtime.      No current facility-administered medications for this visit.    OBJECTIVE: Vitals:   09/21/22 1410 09/21/22 1411  BP: (!) 152/95 (!) 142/100  Pulse: 90   Temp: 98.5 F (36.9 C)   SpO2: 100%      Body mass index is 25.92 kg/m.    ECOG FS:0 - Asymptomatic  General: Well-developed, well-nourished, no acute  distress. Eyes: Pink conjunctiva, anicteric sclera. HEENT: Normocephalic, moist mucous membranes. Lungs: No audible wheezing or coughing. Heart: Regular rate and rhythm. Abdomen: Soft, nontender, no obvious distention. Musculoskeletal: No edema, cyanosis, or clubbing. Neuro: Alert, answering all questions appropriately. Cranial nerves grossly intact. Skin: No rashes or petechiae noted. Psych: Normal affect.  LAB RESULTS:  Lab Results  Component Value Date   NA 141 09/21/2022   K 3.8 09/21/2022   CL 107 09/21/2022   CO2 26 09/21/2022   GLUCOSE 81 09/21/2022   BUN 13 09/21/2022   CREATININE 0.81 09/21/2022   CALCIUM 9.4 09/21/2022   PROT 7.6 09/21/2022   ALBUMIN 4.1 09/21/2022   AST 26 09/21/2022   ALT 22 09/21/2022   ALKPHOS 87 09/21/2022   BILITOT 0.5 09/21/2022   GFRNONAA >60 09/21/2022  GFRAA >60 10/12/2016    Lab Results  Component Value Date   WBC 6.4 09/21/2022   NEUTROABS 4.6 03/20/2022   HGB 13.5 09/21/2022   HCT 40.5 09/21/2022   MCV 88.0 09/21/2022   PLT 319 09/21/2022     STUDIES: No results found.  ASSESSMENT: Pathologic stage IIa ER/PR positive, HER-2 negative invasive carcinoma of the upper outer quadrant of the right breast.  PLAN:    Pathologic stage IIa ER/PR positive, HER-2 negative invasive carcinoma of the upper outer quadrant of the right breast: Patient completed her neoadjuvant chemotherapy on Oct 12, 2016. Her lumpectomy was done on November 30, 2016. Patient completed her XRT at [facility] in approximately October 2018.  She could not tolerate letrozole and was switched to anastrozole.  Given patient's stage of disease, have recommended a total of 7 years of treatment in which patient will complete in October 2025.  Her most recent mammogram on March 17, 2022 was reported as BI-RADS 1.  Repeat in October 2024.  Return to clinic in 6 months for routine evaluation.   Peripheral neuropathy: Patient does not complain of this today. Osteopenia:  Patient's most recent bone mineral density on July 13, 2022 reported T-score of -2.1 which is slightly worse than prior.  Continue to monitor closely.  Continue calcium and vitamin D supplementation.  Repeat in February 2025.   Patient expressed understanding and was in agreement with this plan. She also understands that She can call clinic at any time with any questions, concerns, or complaints.    Cancer Staging  Malignant neoplasm of upper-outer quadrant of female breast [facility]) Staging form: Breast, AJCC 7th Edition - Clinical stage from 04/26/2016: Stage IIA (T1c, N1, M0) - Signed by Jeralyn Ruths, MD on 05/25/2016 Laterality: Right Estrogen receptor status: Positive Progesterone receptor status: Positive HER2 status: Negative - Pathologic stage from 12/15/2016: Stage IIA (yT1c, N1a, cM0) - Signed by Jeralyn Ruths, MD on 12/15/2016 Stage prefix: Post-therapy Laterality: Right Estrogen receptor status: Positive Progesterone receptor status: Positive HER2 status: Negative    Jeralyn Ruths, MD 09/21/22 4:11 PM

## 2022-09-30 ENCOUNTER — Other Ambulatory Visit: Payer: Self-pay | Admitting: Nurse Practitioner

## 2022-09-30 DIAGNOSIS — M8580 Other specified disorders of bone density and structure, unspecified site: Secondary | ICD-10-CM

## 2022-10-18 ENCOUNTER — Telehealth: Payer: Self-pay | Admitting: Family Medicine

## 2022-10-18 NOTE — Telephone Encounter (Signed)
Pt is calling in because her husband is a pt of Dr. Caralee Ates and she would like to become a patient but her husband told her Dr. Caralee Ates doesn't prescribe Xanax, and pt is on Xanax for anxiety and wants to know is that true or not. Pt says she can be reached on MyChart with the response, because it's hard to reach her on the phone.

## 2022-10-19 NOTE — Telephone Encounter (Signed)
Sent pt a mychart message per her request, letting her know that our office does not prescribe any controlled meds, including xanax.

## 2022-10-30 ENCOUNTER — Other Ambulatory Visit: Payer: Self-pay | Admitting: Nurse Practitioner

## 2022-11-07 ENCOUNTER — Other Ambulatory Visit: Payer: Self-pay | Admitting: Family Medicine

## 2022-11-07 DIAGNOSIS — F419 Anxiety disorder, unspecified: Secondary | ICD-10-CM

## 2022-11-08 ENCOUNTER — Encounter: Payer: Self-pay | Admitting: Oncology

## 2022-11-08 ENCOUNTER — Encounter: Payer: Self-pay | Admitting: Family Medicine

## 2022-11-10 ENCOUNTER — Other Ambulatory Visit: Payer: Self-pay | Admitting: *Deleted

## 2022-11-10 DIAGNOSIS — F419 Anxiety disorder, unspecified: Secondary | ICD-10-CM

## 2022-11-10 MED ORDER — ALPRAZOLAM 0.25 MG PO TABS: 0.1250 mg | ORAL_TABLET | Freq: Two times a day (BID) | ORAL | 0 refills | Status: DC | PRN

## 2022-11-17 ENCOUNTER — Encounter: Payer: Self-pay | Admitting: Medical Oncology

## 2022-11-17 ENCOUNTER — Ambulatory Visit: Payer: 59 | Admitting: Nurse Practitioner

## 2022-11-17 ENCOUNTER — Inpatient Hospital Stay: Payer: 59 | Attending: Oncology | Admitting: Medical Oncology

## 2022-11-17 VITALS — BP 155/99 | HR 97 | Temp 99.2°F | Resp 16 | Ht 69.0 in | Wt 175.9 lb

## 2022-11-17 DIAGNOSIS — C50411 Malignant neoplasm of upper-outer quadrant of right female breast: Secondary | ICD-10-CM | POA: Diagnosis not present

## 2022-11-17 DIAGNOSIS — Z170 Estrogen receptor positive status [ER+]: Secondary | ICD-10-CM

## 2022-11-17 DIAGNOSIS — M79621 Pain in right upper arm: Secondary | ICD-10-CM | POA: Diagnosis not present

## 2022-11-17 DIAGNOSIS — Z79811 Long term (current) use of aromatase inhibitors: Secondary | ICD-10-CM | POA: Insufficient documentation

## 2022-11-17 NOTE — Progress Notes (Signed)
Noticed a place under right axillary area above where her previous incision was where she had lymph nodes removed in the past. Just wanted to make sure it was nothing of concern.

## 2022-11-17 NOTE — Progress Notes (Signed)
Symptom Management Clinic [facility] at [facility] [phone] [phone]  Patient Care Team: Reva Bores, MD as PCP - General (Obstetrics and Gynecology) Nadeen Landau, MD (Inactive) as Referring Physician (Surgery) Jeralyn Ruths, MD as Consulting Physician (Oncology) Benita Gutter, RN as Registered Nurse Lucrezia Europe, Dickey Gave, MD as Referring Physician (Radiation Oncology)   Name of the patient: Kimberly Clarke  [id number]  January 11, 1956   Oncological History: Pathologic stage IIa ER/PR positive, HER-2 negative invasive carcinoma of the upper outer quadrant of the right breast.   Completed her neoadjuvant chemotherapy on Oct 12, 2016. Her lumpectomy was done on November 30, 2016. Patient completed her XRT at [facility] in approximately October 2018   Current Treatment: Anastrozole-   Date of visit: 11/17/22  Reason for Consult: JACOYA CRESSY is a 67 y.o. female who presents today for:  Right arm concern: Pt states that after working outside a lot over the past 2 weeks with shoveling, using a wheel borrow. When rubbing a sore muscle she felt what she felt like a possible small bump around her axillary lymph surgical site. She thinks this has resolved over the past 2 weeks. No unintentional weight loss, night sweats, breast pain or changes. Her last mammogram was on 03/17/2022 and was BI-Rads category 1.   Denies any neurologic complaints. Denies recent fevers or illnesses. Denies any easy bleeding or bruising. Reports good appetite and denies weight loss. Denies chest pain. Denies any nausea, vomiting, constipation, or diarrhea. Denies urinary complaints. Patient offers no further specific complaints today.   PAST MEDICAL HISTORY: Past Medical History:  Diagnosis Date   Anxiety    Cancer (HCC) 04/24/2016   right breast.  chemo finished 09/2016   History of hiatal hernia    HSV infection    HISTORY OF HSV   Osteopenia     Personal history of chemotherapy    Personal history of radiation therapy    PONV (postoperative nausea and vomiting)    nausea no vomiting   Post-menopausal 10/20/2005   Postmenopause bleeding    Spasmodic dysphonia    Thickened endometrium 05/30/2016    PAST SURGICAL HISTORY:  Past Surgical History:  Procedure Laterality Date   ANAL FISTULECTOMY     BREAST BIOPSY Left 02/05/2019   Stereo, Rondell Reams,  benign   BREAST LUMPECTOMY Right 11/30/2016   lumpectomy with rad and neoadj chemo   DILATATION & CURETTAGE/HYSTEROSCOPY WITH MYOSURE N/A 01/05/2022   Procedure: DILATATION & CURETTAGE/HYSTEROSCOPY WITH MYOSURE;  Surgeon: Reva Bores, MD;  Location: [facility] OR;  Service: Gynecology;  Laterality: N/A;   ENDOMETRIAL BIOPSY     KNEE SURGERY     snow skating   PARTIAL MASTECTOMY WITH NEEDLE LOCALIZATION Right 11/30/2016   Procedure: PARTIAL MASTECTOMY WITH NEEDLE LOCALIZATION;  Surgeon: Nadeen Landau, MD;  Location: [facility] ORS;  Service: General;  Laterality: Right;   PORTACATH PLACEMENT N/A 05/23/2016   Procedure: INSERTION PORT-A-CATH;  Surgeon: Nadeen Landau, MD;  Location: [facility] ORS;  Service: General;  Laterality: N/A;   removal of port a cath     01/2018   SENTINEL NODE BIOPSY Right 11/30/2016   Procedure: SENTINEL NODE BIOPSY;  Surgeon: Nadeen Landau, MD;  Location: [facility] ORS;  Service: General;  Laterality: Right;    HEMATOLOGY/ONCOLOGY HISTORY:  Oncology History   No history exists.    ALLERGIES:  is allergic to penicillins.  MEDICATIONS:  Current Outpatient Medications  Medication Sig Dispense Refill   alendronate (  FOSAMAX) 70 MG tablet TAKE 1 TABLET (70 MG TOTAL) BY MOUTH EVERY MONDAY. TAKE WITH A FULL GLASS OF WATER ON AN EMPTY STOMACH.FOR OSTEOPENIA. 12 tablet 1   ALPRAZolam (XANAX) 0.25 MG tablet Take 0.5 tablets (0.125 mg total) by mouth 2 (two) times daily as needed for anxiety. 30 tablet 0   anastrozole (ARIMIDEX) 1 MG tablet TAKE 1 TABLET BY MOUTH EVERY  DAY 90 tablet 3   BIOTIN PO Take 1 tablet by mouth daily.      calcium citrate-vitamin D (CITRACAL+D) 315-200 MG-UNIT tablet Take 2-3 tablets by mouth daily.     carboxymethylcellulose (REFRESH TEARS) 0.5 % SOLN Place 1 drop into both eyes at bedtime.     docusate sodium (COLACE) 100 MG capsule Take 200 mg by mouth at bedtime.     Multiple Vitamins-Minerals (MULTIVITAMIN ADULTS 50+) TABS Take 1 tablet by mouth daily.     polycarbophil (FIBERCON) 625 MG tablet Take 1,250 mg by mouth at bedtime.      No current facility-administered medications for this visit.    VITAL SIGNS: BP (!) 155/99 (BP Location: Left Arm, Patient Position: Sitting, Cuff Size: Normal)   Pulse 97   Temp 99.2 F (37.3 C) (Tympanic)   Resp 16   Ht 5\' 9"  (1.753 m)   Wt 175 lb 14.4 oz (79.8 kg)   SpO2 98%   BMI 25.98 kg/m  Filed Weights   11/17/22 1427  Weight: 175 lb 14.4 oz (79.8 kg)    Estimated body mass index is 25.98 kg/m as calculated from the following:   Height as of this encounter: 5\' 9"  (1.753 m).   Weight as of this encounter: 175 lb 14.4 oz (79.8 kg).  LABS: CBC:    Component Value Date/Time   WBC 6.4 09/21/2022 1343   WBC 6.9 03/20/2022 1524   HGB 13.5 09/21/2022 1343   HCT 40.5 09/21/2022 1343   PLT 319 09/21/2022 1343   MCV 88.0 09/21/2022 1343   NEUTROABS 4.6 03/20/2022 1524   LYMPHSABS 1.4 03/20/2022 1524   MONOABS 0.6 03/20/2022 1524   EOSABS 0.2 03/20/2022 1524   BASOSABS 0.1 03/20/2022 1524   Comprehensive Metabolic Panel:    Component Value Date/Time   NA 141 09/21/2022 1343   K 3.8 09/21/2022 1343   CL 107 09/21/2022 1343   CO2 26 09/21/2022 1343   BUN 13 09/21/2022 1343   CREATININE 0.81 09/21/2022 1343   CREATININE 0.91 03/23/2015 1641   GLUCOSE 81 09/21/2022 1343   CALCIUM 9.4 09/21/2022 1343   AST 26 09/21/2022 1343   ALT 22 09/21/2022 1343   ALKPHOS 87 09/21/2022 1343   BILITOT 0.5 09/21/2022 1343   PROT 7.6 09/21/2022 1343   ALBUMIN 4.1 09/21/2022 1343     RADIOGRAPHIC STUDIES: No results found.  PERFORMANCE STATUS (ECOG) : 1 - Symptomatic but completely ambulatory  Review of Systems Unless otherwise noted, a complete review of systems is negative.  Physical Exam General: NAD Cardiovascular: regular rate and rhythm Pulmonary: clear ant fields Lymph: No palpable lymphadenopathy of head and neck Breast: Bilateral breasts without palpable lumps. No axillary lymphadenopathy palpable. No nipple discharge or abnormal skin texture changes.  Extremities: no edema, no joint deformities Skin: no rashes Neurological: Weakness but otherwise nonfocal  Assessment and Plan- Patient is a 67 y.o. female with a history of stage IIa ER/PR positive, HER-2 negative invasive carcinoma of the upper outer quadrant of the right breast.   No palpable abnormality on examination. Offered breast US however  Pt declined due to cost concerns. She will contact her insurance company and alert me if she would like imaging ahead of her next upcoming mammogram. Discussed red flag signs and symptoms.   Follow up as needed.     Encounter Diagnoses  Name Primary?   Axillary tenderness, right Yes   Malignant neoplasm of upper-outer quadrant of right breast in female, estrogen receptor positive (HCC)    Patient expressed understanding and was in agreement with this plan. She also understands that She can call clinic at any time with any questions, concerns, or complaints.   Thank you for allowing me to participate in the care of this very pleasant patient.   Time Total: 25  Visit consisted of counseling and education dealing with the complex and emotionally intense issues of symptom management in the setting of serious illness.Greater than 50%  of this time was spent counseling and coordinating care related to the above assessment and plan.  Signed by: Clent Jacks, PA-C

## 2022-12-20 ENCOUNTER — Encounter: Payer: Self-pay | Admitting: Family Medicine

## 2022-12-20 ENCOUNTER — Ambulatory Visit (INDEPENDENT_AMBULATORY_CARE_PROVIDER_SITE_OTHER): Payer: 59 | Admitting: Family Medicine

## 2022-12-20 ENCOUNTER — Other Ambulatory Visit (HOSPITAL_COMMUNITY): Admission: RE | Admit: 2022-12-20 | Payer: 59 | Source: Ambulatory Visit

## 2022-12-20 VITALS — BP 105/72 | HR 84 | Ht 69.0 in | Wt 178.4 lb

## 2022-12-20 DIAGNOSIS — C50411 Malignant neoplasm of upper-outer quadrant of right female breast: Secondary | ICD-10-CM

## 2022-12-20 DIAGNOSIS — Z01419 Encounter for gynecological examination (general) (routine) without abnormal findings: Secondary | ICD-10-CM

## 2022-12-20 DIAGNOSIS — Z170 Estrogen receptor positive status [ER+]: Secondary | ICD-10-CM

## 2022-12-20 DIAGNOSIS — Z124 Encounter for screening for malignant neoplasm of cervix: Secondary | ICD-10-CM

## 2022-12-20 NOTE — Assessment & Plan Note (Addendum)
16109 - New symptoms. Will get diagnostic mammo and u/s. While sx's have mostly resolved and can be attributed to work, will get imaging just in case.

## 2022-12-20 NOTE — Progress Notes (Signed)
Subjective:     Kimberly Clarke is a 67 y.o. female and is here for a comprehensive physical exam. The patient reports problems - axillary mass after doing a lot of yard work. Seems to have resolved now. Has some tenderness in the area.   The following portions of the patient's history were reviewed and updated as appropriate: allergies, current medications, past family history, past medical history, past social history, past surgical history, and problem list.  Review of Systems Pertinent items noted in HPI and remainder of comprehensive ROS otherwise negative.   Objective:  Chaperone present for exam   BP 105/72   Pulse 84   Ht 5\' 9"  (1.753 m)   Wt 178 lb 6.4 oz (80.9 kg)   BMI 26.35 kg/m  General appearance: alert, cooperative, and appears stated age Head: Normocephalic, without obvious abnormality, atraumatic Neck: no adenopathy, supple, symmetrical, trachea midline, and thyroid not enlarged, symmetric, no tenderness/mass/nodules Lungs: clear to auscultation bilaterally Breasts:  left breast without lesion or tenderness, right breast with tenderness under previous excision. No mass noted Heart: regular rate and rhythm, S1, S2 normal, no murmur, click, rub or gallop Abdomen: soft, non-tender; bowel sounds normal; no masses,  no organomegaly Pelvic: cervix normal in appearance, external genitalia normal, no adnexal masses or tenderness, no cervical motion tenderness, uterus normal size, shape, and consistency, and vagina normal without discharge Extremities: extremities normal, atraumatic, no cyanosis or edema Pulses: 2+ and symmetric Skin: Skin color, texture, turgor normal. No rashes or lesions Lymph nodes: Cervical, supraclavicular, and axillary nodes normal. Neurologic: Grossly normal    Assessment:    Healthy female exam.      Plan:   Problem List Items Addressed This Visit       Unprioritized   Malignant neoplasm of upper-outer quadrant of female breast (HCC)     08657 - New symptoms. Will get diagnostic mammo and u/s. While sx's have mostly resolved and can be attributed to work, will get imaging just in case.      Relevant Orders   [place] LIMITED ULTRASOUND INCLUDING AXILLA RIGHT BREAST   MM 3D DIAGNOSTIC MAMMOGRAM BILATERAL BREAST   MM 3D DIAGNOSTIC MAMMOGRAM UNILATERAL RIGHT BREAST   Other Visit Diagnoses     Screening for malignant neoplasm of cervix    -  Primary   Relevant Orders   Cytology - PAP   Encounter for gynecological examination without abnormal finding           Return in 1 year (on 12/20/2023).     See After Visit Summary for Counseling Recommendations

## 2022-12-20 NOTE — Progress Notes (Signed)
Patient presents for Annual.  LMP: Post-Menopausal  Last pap: Date: 08/12/20 -WNL  Contraception: Post-menopausal Mammogram: Up to date: 03/19/23  STD Screening: Declines Flu Vaccine : N/A  CC:  Annual/ Breast exam 5 years remission, stage 2 breast exam and lymph node removed there also   Is doing yard work- went to cancer center and lump was felt she wants breast exam today also

## 2022-12-25 ENCOUNTER — Encounter: Payer: Self-pay | Admitting: Family Medicine

## 2022-12-26 ENCOUNTER — Ambulatory Visit: Payer: Self-pay | Admitting: Internal Medicine

## 2022-12-28 ENCOUNTER — Other Ambulatory Visit: Payer: 59

## 2023-01-24 ENCOUNTER — Ambulatory Visit
Admission: RE | Admit: 2023-01-24 | Discharge: 2023-01-24 | Disposition: A | Discharge status: Home / Self Care | Payer: 59 | Source: Ambulatory Visit | Attending: Family Medicine | Admitting: Family Medicine

## 2023-01-24 ENCOUNTER — Other Ambulatory Visit: Payer: Self-pay | Admitting: Family Medicine

## 2023-01-24 DIAGNOSIS — C50411 Malignant neoplasm of upper-outer quadrant of right female breast: Secondary | ICD-10-CM | POA: Diagnosis present

## 2023-01-24 DIAGNOSIS — Z170 Estrogen receptor positive status [ER+]: Secondary | ICD-10-CM

## 2023-01-24 DIAGNOSIS — Z124 Encounter for screening for malignant neoplasm of cervix: Secondary | ICD-10-CM

## 2023-01-24 DIAGNOSIS — Z01419 Encounter for gynecological examination (general) (routine) without abnormal findings: Secondary | ICD-10-CM

## 2023-03-19 ENCOUNTER — Ambulatory Visit
Admission: RE | Admit: 2023-03-19 | Discharge: 2023-03-19 | Disposition: A | Discharge status: Home / Self Care | Payer: 59 | Source: Ambulatory Visit | Attending: Oncology | Admitting: Oncology

## 2023-03-19 DIAGNOSIS — Z1231 Encounter for screening mammogram for malignant neoplasm of breast: Secondary | ICD-10-CM | POA: Insufficient documentation

## 2023-03-19 DIAGNOSIS — R92323 Mammographic fibroglandular density, bilateral breasts: Secondary | ICD-10-CM | POA: Insufficient documentation

## 2023-03-19 DIAGNOSIS — Z853 Personal history of malignant neoplasm of breast: Secondary | ICD-10-CM | POA: Insufficient documentation

## 2023-03-19 DIAGNOSIS — Z08 Encounter for follow-up examination after completed treatment for malignant neoplasm: Secondary | ICD-10-CM | POA: Insufficient documentation

## 2023-03-29 ENCOUNTER — Inpatient Hospital Stay: Payer: 59 | Attending: Oncology | Admitting: Oncology

## 2023-03-29 ENCOUNTER — Encounter: Payer: Self-pay | Admitting: Oncology

## 2023-03-29 VITALS — BP 135/94 | HR 94 | Temp 96.9°F | Resp 19 | Wt 182.0 lb

## 2023-03-29 DIAGNOSIS — M8580 Other specified disorders of bone density and structure, unspecified site: Secondary | ICD-10-CM | POA: Insufficient documentation

## 2023-03-29 DIAGNOSIS — Z79811 Long term (current) use of aromatase inhibitors: Secondary | ICD-10-CM | POA: Diagnosis not present

## 2023-03-29 DIAGNOSIS — G629 Polyneuropathy, unspecified: Secondary | ICD-10-CM | POA: Insufficient documentation

## 2023-03-29 DIAGNOSIS — Z08 Encounter for follow-up examination after completed treatment for malignant neoplasm: Secondary | ICD-10-CM

## 2023-03-29 DIAGNOSIS — Z853 Personal history of malignant neoplasm of breast: Secondary | ICD-10-CM | POA: Diagnosis not present

## 2023-03-29 DIAGNOSIS — Z170 Estrogen receptor positive status [ER+]: Secondary | ICD-10-CM | POA: Diagnosis present

## 2023-03-29 DIAGNOSIS — C50411 Malignant neoplasm of upper-outer quadrant of right female breast: Secondary | ICD-10-CM | POA: Diagnosis present

## 2023-03-29 NOTE — Progress Notes (Signed)
[facility]  Telephone:(336) [phone] Fax:(336) [phone]  ID: Waldon Merl OB: 12/29/1955  MR#: [id number]  QQV#:[id number]  Patient Care Team: Reva Bores, MD as PCP - General (Obstetrics and Gynecology) Nadeen Landau, MD (Inactive) as Referring Physician (Surgery) Jeralyn Ruths, MD as Consulting Physician (Oncology) Benita Gutter, RN as Registered Nurse Lucrezia Europe, Dickey Gave, MD as Referring Physician (Radiation Oncology)   CHIEF COMPLAINT: Pathologic stage IIa ER/PR positive, HER-2 negative invasive carcinoma of the upper outer quadrant of the right breast.  INTERVAL HISTORY: Patient returns to clinic today for routine 93-month evaluation.  She continues to feel well and remains asymptomatic.  She continues to tolerate anastrozole without significant side effects. She has no neurologic complaints.  She denies any recent fevers or illnesses.  She has a good appetite.  She denies any chest pain, shortness of breath, cough, or hemoptysis.  She denies any nausea, vomiting, constipation, or diarrhea. She has no urinary complaints.  Patient offers no specific complaints today.  REVIEW OF SYSTEMS:   Review of Systems  Constitutional: Negative.  Negative for fever, malaise/fatigue and weight loss.  HENT:  Negative for congestion and sinus pain.   Respiratory: Negative.  Negative for cough and shortness of breath.   Cardiovascular: Negative.  Negative for chest pain and leg swelling.  Gastrointestinal:  Negative for abdominal pain, constipation, diarrhea, nausea and vomiting.  Genitourinary: Negative.  Negative for dysuria.  Musculoskeletal: Negative.  Negative for joint pain.  Skin: Negative.  Negative for rash.  Neurological: Negative.  Negative for tingling, sensory change and weakness.  Psychiatric/Behavioral: Negative.  Negative for depression. The patient is not nervous/anxious and does not have insomnia.     As per HPI. Otherwise, a complete  review of systems is negative.  PAST MEDICAL HISTORY: Past Medical History:  Diagnosis Date   Anxiety    Cancer (HCC) 04/24/2016   right breast.  chemo finished 09/2016   History of hiatal hernia    HSV infection    HISTORY OF HSV   Osteopenia    Personal history of chemotherapy    Personal history of radiation therapy    PONV (postoperative nausea and vomiting)    nausea no vomiting   Post-menopausal 10/20/2005   Postmenopause bleeding    Spasmodic dysphonia    Thickened endometrium 05/30/2016    PAST SURGICAL HISTORY: Past Surgical History:  Procedure Laterality Date   ANAL FISTULECTOMY     BREAST BIOPSY Left 02/05/2019   Stereo, Rondell Reams,  benign   BREAST LUMPECTOMY Right 11/30/2016   lumpectomy with rad and neoadj chemo   DILATATION & CURETTAGE/HYSTEROSCOPY WITH MYOSURE N/A 01/05/2022   Procedure: DILATATION & CURETTAGE/HYSTEROSCOPY WITH MYOSURE;  Surgeon: Reva Bores, MD;  Location: [facility] OR;  Service: Gynecology;  Laterality: N/A;   ENDOMETRIAL BIOPSY     KNEE SURGERY     snow skating   PARTIAL MASTECTOMY WITH NEEDLE LOCALIZATION Right 11/30/2016   Procedure: PARTIAL MASTECTOMY WITH NEEDLE LOCALIZATION;  Surgeon: Nadeen Landau, MD;  Location: [facility] ORS;  Service: General;  Laterality: Right;   PORTACATH PLACEMENT N/A 05/23/2016   Procedure: INSERTION PORT-A-CATH;  Surgeon: Nadeen Landau, MD;  Location: [facility] ORS;  Service: General;  Laterality: N/A;   removal of port a cath     01/2018   SENTINEL NODE BIOPSY Right 11/30/2016   Procedure: SENTINEL NODE BIOPSY;  Surgeon: Nadeen Landau, MD;  Location: [facility] ORS;  Service: General;  Laterality: Right;    FAMILY HISTORY:  Family History  Problem Relation Age of Onset   Cancer Mother        BREAST AND BONES   Breast cancer Mother 74   Hearing loss Mother    Emphysema Father    Cancer Father    Alcohol abuse Father    Cancer Maternal Aunt    Hearing loss Maternal Grandmother    Hypertension Sister     Miscarriages / [place] Sister     ADVANCED DIRECTIVES (Y/N):  N  HEALTH MAINTENANCE: Social History   Tobacco Use   Smoking status: Never   Smokeless tobacco: Never  Vaping Use   Vaping status: Never Used  Substance Use Topics   Alcohol use: Yes    Alcohol/week: 6.0 - 8.0 standard drinks of alcohol    Types: 3 - 4 Glasses of wine, 3 - 4 Cans of beer per week    Comment: WEEKENDS OCC   Drug use: No     Colonoscopy:  PAP:  Bone density:  Lipid panel:  Allergies  Allergen Reactions   Penicillins Anaphylaxis    Per pt rxn as kid ? Unsure of reaction     Current Outpatient Medications  Medication Sig Dispense Refill   alendronate (FOSAMAX) 70 MG tablet TAKE 1 TABLET (70 MG TOTAL) BY MOUTH EVERY MONDAY. TAKE WITH A FULL GLASS OF WATER ON AN EMPTY STOMACH.FOR OSTEOPENIA. 12 tablet 1   ALPRAZolam (XANAX) 0.25 MG tablet Take 0.5 tablets (0.125 mg total) by mouth 2 (two) times daily as needed for anxiety. 30 tablet 0   anastrozole (ARIMIDEX) 1 MG tablet TAKE 1 TABLET BY MOUTH EVERY DAY 90 tablet 3   BIOTIN PO Take 1 tablet by mouth daily.      calcium citrate-vitamin D (CITRACAL+D) 315-200 MG-UNIT tablet Take 2-3 tablets by mouth daily.     carboxymethylcellulose (REFRESH TEARS) 0.5 % SOLN Place 1 drop into both eyes at bedtime.     docusate sodium (COLACE) 100 MG capsule Take 200 mg by mouth at bedtime.     Multiple Vitamins-Minerals (MULTIVITAMIN ADULTS 50+) TABS Take 1 tablet by mouth daily.     polycarbophil (FIBERCON) 625 MG tablet Take 1,250 mg by mouth at bedtime.      No current facility-administered medications for this visit.    OBJECTIVE: Vitals:   03/29/23 1358  BP: (!) 135/94  Pulse: 94  Resp: 19  Temp: (!) 96.9 F (36.1 C)  SpO2: 98%     Body mass index is 26.88 kg/m.    ECOG FS:0 - Asymptomatic  General: Well-developed, well-nourished, no acute distress. Eyes: Pink conjunctiva, anicteric sclera. HEENT: Normocephalic, moist mucous  membranes. Lungs: No audible wheezing or coughing. Heart: Regular rate and rhythm. Abdomen: Soft, nontender, no obvious distention. Musculoskeletal: No edema, cyanosis, or clubbing. Neuro: Alert, answering all questions appropriately. Cranial nerves grossly intact. Skin: No rashes or petechiae noted. Psych: Normal affect.  LAB RESULTS:  Lab Results  Component Value Date   NA 141 09/21/2022   K 3.8 09/21/2022   CL 107 09/21/2022   CO2 26 09/21/2022   GLUCOSE 81 09/21/2022   BUN 13 09/21/2022   CREATININE 0.81 09/21/2022   CALCIUM 9.4 09/21/2022   PROT 7.6 09/21/2022   ALBUMIN 4.1 09/21/2022   AST 26 09/21/2022   ALT 22 09/21/2022   ALKPHOS 87 09/21/2022   BILITOT 0.5 09/21/2022   GFRNONAA >60 09/21/2022   GFRAA >60 10/12/2016    Lab Results  Component Value Date   WBC 6.4 09/21/2022  NEUTROABS 4.6 03/20/2022   HGB 13.5 09/21/2022   HCT 40.5 09/21/2022   MCV 88.0 09/21/2022   PLT 319 09/21/2022     STUDIES: MM 3D SCREENING MAMMOGRAM BILATERAL BREAST  Result Date: 03/21/2023 CLINICAL DATA:  Screening. EXAM: DIGITAL SCREENING BILATERAL MAMMOGRAM WITH TOMOSYNTHESIS AND CAD TECHNIQUE: Bilateral screening digital craniocaudal and mediolateral oblique mammograms were obtained. Bilateral screening digital breast tomosynthesis was performed. The images were evaluated with computer-aided detection. COMPARISON:  Previous exam(s). ACR Breast Density Category b: There are scattered areas of fibroglandular density. FINDINGS: There are no findings suspicious for malignancy. IMPRESSION: No mammographic evidence of malignancy. A result letter of this screening mammogram will be mailed directly to the patient. RECOMMENDATION: Screening mammogram in one year. (Code:SM-B-01Y) BI-RADS CATEGORY  1: Negative. Electronically Signed   By: Elberta Fortis M.D.   On: 03/21/2023 15:51    ASSESSMENT: Pathologic stage IIa ER/PR positive, HER-2 negative invasive carcinoma of the upper outer quadrant  of the right breast.  PLAN:    Pathologic stage IIa ER/PR positive, HER-2 negative invasive carcinoma of the upper outer quadrant of the right breast: Patient completed her neoadjuvant chemotherapy on Oct 12, 2016. Her lumpectomy was done on November 30, 2016. Patient completed her XRT at [facility] in approximately October 2018.  She could not tolerate letrozole and was switched to anastrozole.  Given patient's high risk disease she agreed to take 2 additional years of anastrozole and will complete 7 years of treatment in October 2025.  Her most recent mammogram on March 21, 2023 was reported BI-RADS 1.  Repeat in October 2025.  Return to clinic in 6 months for routine evaluation.  Peripheral neuropathy: Patient does not complain of this today. Osteopenia: Patient's most recent bone mineral density on July 13, 2022 reported T-score of -2.1 which is slightly worse than prior.  Continue to monitor closely.  Continue calcium and vitamin D supplementation.  Repeat in February 2025.  I spent a total of 20 minutes reviewing chart data, face-to-face evaluation with the patient, counseling and coordination of care as detailed above.   Patient expressed understanding and was in agreement with this plan. She also understands that She can call clinic at any time with any questions, concerns, or complaints.    Cancer Staging  Malignant neoplasm of upper-outer quadrant of female breast [facility]) Staging form: Breast, AJCC 7th Edition - Clinical stage from 04/26/2016: Stage IIA (T1c, N1, M0) - Signed by Jeralyn Ruths, MD on 05/25/2016 Laterality: Right Estrogen receptor status: Positive Progesterone receptor status: Positive HER2 status: Negative - Pathologic stage from 12/15/2016: Stage IIA (yT1c, N1a, cM0) - Signed by Jeralyn Ruths, MD on 12/15/2016 Stage prefix: Post-therapy Laterality: Right Estrogen receptor status: Positive Progesterone receptor status: Positive HER2 status: Negative    Jeralyn Ruths, MD 03/29/23 2:16 PM

## 2023-03-29 NOTE — Progress Notes (Signed)
Patient has no concerns 

## 2023-04-17 ENCOUNTER — Other Ambulatory Visit: Payer: Self-pay | Admitting: Nurse Practitioner

## 2023-04-17 DIAGNOSIS — M8580 Other specified disorders of bone density and structure, unspecified site: Secondary | ICD-10-CM

## 2023-04-22 ENCOUNTER — Encounter: Payer: Self-pay | Admitting: Oncology

## 2023-07-17 ENCOUNTER — Other Ambulatory Visit: Payer: 59

## 2023-08-10 ENCOUNTER — Other Ambulatory Visit: Payer: Self-pay | Admitting: *Deleted

## 2023-08-10 ENCOUNTER — Encounter: Payer: Self-pay | Admitting: Oncology

## 2023-08-10 MED ORDER — ANASTROZOLE 1 MG PO TABS: 1.0000 mg | ORAL_TABLET | Freq: Every day | ORAL | 3 refills | Status: DC

## 2023-08-13 ENCOUNTER — Ambulatory Visit
Admission: RE | Admit: 2023-08-13 | Discharge: 2023-08-13 | Disposition: A | Discharge status: Home / Self Care | Payer: 59 | Source: Ambulatory Visit | Attending: Oncology | Admitting: Oncology

## 2023-08-13 DIAGNOSIS — M8580 Other specified disorders of bone density and structure, unspecified site: Secondary | ICD-10-CM

## 2023-08-13 DIAGNOSIS — Z853 Personal history of malignant neoplasm of breast: Secondary | ICD-10-CM | POA: Diagnosis present

## 2023-08-13 DIAGNOSIS — Z08 Encounter for follow-up examination after completed treatment for malignant neoplasm: Secondary | ICD-10-CM | POA: Diagnosis present

## 2023-08-20 ENCOUNTER — Telehealth: Payer: Self-pay | Admitting: *Deleted

## 2023-08-20 NOTE — Telephone Encounter (Signed)
 The [facility] place has the wrong fax number and she wants to speak to staff because it need a PA and today is the last day to get it set up. Per Orlie Dakin she says that she should have enough of the anastrozole to Oct. Of this year and does not need anything. [employer] number [phone] ref: [id number].  I did send in my call back that the fax number is [phone]

## 2023-08-31 ENCOUNTER — Telehealth: Payer: Self-pay | Admitting: *Deleted

## 2023-08-31 NOTE — Telephone Encounter (Signed)
 Called and asked if the PA was done. The note from the  Dr. Fredna Dow that the the medication anastrozole has enough med through oct. You can call if you have issues

## 2023-09-27 ENCOUNTER — Encounter: Payer: Self-pay | Admitting: Oncology

## 2023-09-27 ENCOUNTER — Inpatient Hospital Stay: Payer: 59 | Attending: Oncology | Admitting: Oncology

## 2023-09-27 VITALS — BP 154/103 | HR 88 | Temp 98.3°F | Resp 16 | Ht 69.0 in | Wt 185.0 lb

## 2023-09-27 DIAGNOSIS — Z08 Encounter for follow-up examination after completed treatment for malignant neoplasm: Secondary | ICD-10-CM

## 2023-09-27 DIAGNOSIS — Z1732 Human epidermal growth factor receptor 2 negative status: Secondary | ICD-10-CM | POA: Insufficient documentation

## 2023-09-27 DIAGNOSIS — Z9221 Personal history of antineoplastic chemotherapy: Secondary | ICD-10-CM | POA: Diagnosis not present

## 2023-09-27 DIAGNOSIS — M8580 Other specified disorders of bone density and structure, unspecified site: Secondary | ICD-10-CM | POA: Diagnosis not present

## 2023-09-27 DIAGNOSIS — Z170 Estrogen receptor positive status [ER+]: Secondary | ICD-10-CM | POA: Diagnosis not present

## 2023-09-27 DIAGNOSIS — Z1721 Progesterone receptor positive status: Secondary | ICD-10-CM | POA: Insufficient documentation

## 2023-09-27 DIAGNOSIS — C50411 Malignant neoplasm of upper-outer quadrant of right female breast: Secondary | ICD-10-CM | POA: Insufficient documentation

## 2023-09-27 DIAGNOSIS — Z79811 Long term (current) use of aromatase inhibitors: Secondary | ICD-10-CM | POA: Diagnosis not present

## 2023-09-27 DIAGNOSIS — Z803 Family history of malignant neoplasm of breast: Secondary | ICD-10-CM | POA: Insufficient documentation

## 2023-09-27 DIAGNOSIS — Z853 Personal history of malignant neoplasm of breast: Secondary | ICD-10-CM | POA: Diagnosis not present

## 2023-09-27 DIAGNOSIS — Z923 Personal history of irradiation: Secondary | ICD-10-CM | POA: Insufficient documentation

## 2023-09-27 NOTE — Progress Notes (Signed)
 Wants to know about starting her fosamax .

## 2023-09-27 NOTE — Progress Notes (Signed)
 [facility]  [phone] [phone]  ID: Kimberly Clarke OB: Jan 01, 1956  MR#: [id number]  AOZ#:[id number]  Patient Care Team: Granville Layer, MD as PCP - General (Obstetrics and Gynecology) Benancio Bracket, MD (Inactive) as Referring Physician (Surgery) Shellie Dials, MD as Consulting Physician (Oncology) Rochell Chroman, RN as Registered Nurse Leeta Puls, Theoplis Fix, MD as Referring Physician (Radiation Oncology)   CHIEF COMPLAINT: Pathologic stage IIa ER/PR positive, HER-2 negative invasive carcinoma of the upper outer quadrant of the right breast.  INTERVAL HISTORY: Patient returns to clinic today for routine 69-month evaluation.  She continues to feel well and remains asymptomatic.  She is tolerating anastrozole  without significant side effects. She has no neurologic complaints.  She denies any recent fevers or illnesses.  She has a good appetite.  She denies any chest pain, shortness of breath, cough, or hemoptysis.  She denies any nausea, vomiting, constipation, or diarrhea. She has no urinary complaints.  Patient offers no specific complaints today.  REVIEW OF SYSTEMS:   Review of Systems  Constitutional: Negative.  Negative for fever, malaise/fatigue and weight loss.  HENT:  Negative for congestion and sinus pain.   Respiratory: Negative.  Negative for cough and shortness of breath.   Cardiovascular: Negative.  Negative for chest pain and leg swelling.  Gastrointestinal:  Negative for abdominal pain, constipation, diarrhea, nausea and vomiting.  Genitourinary: Negative.  Negative for dysuria.  Musculoskeletal: Negative.  Negative for joint pain.  Skin: Negative.  Negative for rash.  Neurological: Negative.  Negative for tingling, sensory change and weakness.  Psychiatric/Behavioral: Negative.  Negative for depression. The patient is not nervous/anxious and does not have insomnia.     As per HPI. Otherwise, a complete review of  systems is negative.  PAST MEDICAL HISTORY: Past Medical History:  Diagnosis Date   Anxiety    Cancer (HCC) 04/24/2016   right breast.  chemo finished 09/2016   History of hiatal hernia    HSV infection    HISTORY OF HSV   Osteopenia    Personal history of chemotherapy    Personal history of radiation therapy    PONV (postoperative nausea and vomiting)    nausea no vomiting   Post-menopausal 10/20/2005   Postmenopause bleeding    Spasmodic dysphonia    Thickened endometrium 05/30/2016    PAST SURGICAL HISTORY: Past Surgical History:  Procedure Laterality Date   ANAL FISTULECTOMY     BREAST BIOPSY Left 02/05/2019   Stereo, Mercie Stalker,  benign   BREAST LUMPECTOMY Right 11/30/2016   lumpectomy with rad and neoadj chemo   DILATATION & CURETTAGE/HYSTEROSCOPY WITH MYOSURE N/A 01/05/2022   Procedure: DILATATION & CURETTAGE/HYSTEROSCOPY WITH MYOSURE;  Surgeon: Granville Layer, MD;  Location: [facility] OR;  Service: Gynecology;  Laterality: N/A;   ENDOMETRIAL BIOPSY     KNEE SURGERY     snow skating   PARTIAL MASTECTOMY WITH NEEDLE LOCALIZATION Right 11/30/2016   Procedure: PARTIAL MASTECTOMY WITH NEEDLE LOCALIZATION;  Surgeon: Benancio Bracket, MD;  Location: [facility] ORS;  Service: General;  Laterality: Right;   PORTACATH PLACEMENT N/A 05/23/2016   Procedure: INSERTION PORT-A-CATH;  Surgeon: Benancio Bracket, MD;  Location: [facility] ORS;  Service: General;  Laterality: N/A;   removal of port a cath     01/2018   SENTINEL NODE BIOPSY Right 11/30/2016   Procedure: SENTINEL NODE BIOPSY;  Surgeon: Benancio Bracket, MD;  Location: [facility] ORS;  Service: General;  Laterality: Right;    FAMILY HISTORY: Family  History  Problem Relation Age of Onset   Cancer Mother        BREAST AND BONES   Breast cancer Mother 33   Hearing loss Mother    Emphysema Father    Cancer Father    Alcohol abuse Father    Cancer Maternal Aunt    Hearing loss Maternal Grandmother    Hypertension Sister    Miscarriages  / [place] Sister     ADVANCED DIRECTIVES (Y/N):  N  HEALTH MAINTENANCE: Social History   Tobacco Use   Smoking status: Never   Smokeless tobacco: Never  Vaping Use   Vaping status: Never Used  Substance Use Topics   Alcohol use: Yes    Alcohol/week: 6.0 - 8.0 standard drinks of alcohol    Types: 3 - 4 Glasses of wine, 3 - 4 Cans of beer per week    Comment: WEEKENDS OCC   Drug use: No     Colonoscopy:  PAP:  Bone density:  Lipid panel:  Allergies  Allergen Reactions   Penicillins Anaphylaxis    Per pt rxn as kid ? Unsure of reaction     Current Outpatient Medications  Medication Sig Dispense Refill   ALPRAZolam  (XANAX ) 0.25 MG tablet Take 0.5 tablets (0.125 mg total) by mouth 2 (two) times daily as needed for anxiety. 30 tablet 0   anastrozole  (ARIMIDEX ) 1 MG tablet Take 1 tablet (1 mg total) by mouth daily. 90 tablet 3   BIOTIN PO Take 1 tablet by mouth daily.      calcium citrate-vitamin D  (CITRACAL+D) 315-200 MG-UNIT tablet Take 2-3 tablets by mouth daily.     carboxymethylcellulose (REFRESH TEARS) 0.5 % SOLN Place 1 drop into both eyes at bedtime.     docusate sodium (COLACE) 100 MG capsule Take 200 mg by mouth at bedtime.     Multiple Vitamins-Minerals (MULTIVITAMIN ADULTS 50+) TABS Take 1 tablet by mouth daily.     polycarbophil (FIBERCON) 625 MG tablet Take 1,250 mg by mouth at bedtime.      alendronate  (FOSAMAX ) 70 MG tablet TAKE 1 TABLET (70 MG TOTAL) BY MOUTH EVERY MONDAY. TAKE WITH A FULL GLASS OF WATER ON AN EMPTY STOMACH.FOR OSTEOPENIA. (Patient not taking: Reported on 09/27/2023) 12 tablet 1   No current facility-administered medications for this visit.    OBJECTIVE: Vitals:   09/27/23 1440  BP: (!) 154/103  Pulse: 88  Resp: 16  Temp: 98.3 F (36.8 C)  SpO2: 98%     Body mass index is 27.32 kg/m.    ECOG FS:0 - Asymptomatic  General: Well-developed, well-nourished, no acute distress. Eyes: Pink conjunctiva, anicteric sclera. HEENT:  Normocephalic, moist mucous membranes. Lungs: No audible wheezing or coughing. Heart: Regular rate and rhythm. Abdomen: Soft, nontender, no obvious distention. Musculoskeletal: No edema, cyanosis, or clubbing. Neuro: Alert, answering all questions appropriately. Cranial nerves grossly intact. Skin: No rashes or petechiae noted. Psych: Normal affect.  LAB RESULTS:  Lab Results  Component Value Date   NA 141 09/21/2022   K 3.8 09/21/2022   CL 107 09/21/2022   CO2 26 09/21/2022   GLUCOSE 81 09/21/2022   BUN 13 09/21/2022   CREATININE 0.81 09/21/2022   CALCIUM 9.4 09/21/2022   PROT 7.6 09/21/2022   ALBUMIN 4.1 09/21/2022   AST 26 09/21/2022   ALT 22 09/21/2022   ALKPHOS 87 09/21/2022   BILITOT 0.5 09/21/2022   GFRNONAA >60 09/21/2022   GFRAA >60 10/12/2016    Lab Results  Component Value Date  WBC 6.4 09/21/2022   NEUTROABS 4.6 03/20/2022   HGB 13.5 09/21/2022   HCT 40.5 09/21/2022   MCV 88.0 09/21/2022   PLT 319 09/21/2022     STUDIES: No results found.  ASSESSMENT: Pathologic stage IIa ER/PR positive, HER-2 negative invasive carcinoma of the upper outer quadrant of the right breast.  PLAN:    Pathologic stage IIa ER/PR positive, HER-2 negative invasive carcinoma of the upper outer quadrant of the right breast: Patient completed her neoadjuvant chemotherapy on Oct 12, 2016. Her lumpectomy was done on November 30, 2016. Patient completed her XRT at [facility] in approximately October 2018.  She could not tolerate letrozole  and was switched to anastrozole .  Given patient's high risk disease she agreed to take 2 additional years of anastrozole  and will complete 7 years of treatment in October 2025.  Her most recent mammogram on March 21, 2023 was reported BI-RADS 1.  Repeat mammogram in October 2025 and if continues to be negative, patient will discontinue anastrozole  at that time.  Return to clinic in 6 months for routine evaluation at which point patient could possibly be  discharged from clinic.   Peripheral neuropathy: Patient does not complain of this today. Osteopenia: Patient's most recent bone mineral density on August 13, 2023 reported T-score of -1.9.  This is slightly improved from 1 year prior.  Continue calcium and vitamin D  supplementation.  Repeat DEXA scan in March 2026 which likely can be ordered by primary care.  Patient expressed understanding and was in agreement with this plan. She also understands that She can call clinic at any time with any questions, concerns, or complaints.    Cancer Staging  Malignant neoplasm of upper-outer quadrant of female breast [facility]) Staging form: Breast, AJCC 7th Edition - Clinical stage from 04/26/2016: Stage IIA (T1c, N1, M0) - Signed by Shellie Dials, MD on 05/25/2016 Laterality: Right Estrogen receptor status: Positive Progesterone receptor status: Positive HER2 status: Negative - Pathologic stage from 12/15/2016: Stage IIA (yT1c, N1a, cM0) - Signed by Shellie Dials, MD on 12/15/2016 Stage prefix: Post-therapy Laterality: Right Estrogen receptor status: Positive Progesterone receptor status: Positive HER2 status: Negative    Shellie Dials, MD 09/27/23 3:11 PM

## 2023-11-12 ENCOUNTER — Encounter: Payer: Self-pay | Admitting: Oncology

## 2023-11-13 ENCOUNTER — Other Ambulatory Visit: Payer: Self-pay

## 2023-11-14 ENCOUNTER — Other Ambulatory Visit: Payer: Self-pay | Admitting: *Deleted

## 2023-11-14 ENCOUNTER — Encounter: Payer: Self-pay | Admitting: *Deleted

## 2023-11-14 MED ORDER — ANASTROZOLE 1 MG PO TABS: 1.0000 mg | ORAL_TABLET | Freq: Every day | ORAL | 0 refills | Status: AC

## 2024-01-23 ENCOUNTER — Encounter: Payer: Self-pay | Admitting: Oncology

## 2024-03-19 ENCOUNTER — Ambulatory Visit
Admission: RE | Admit: 2024-03-19 | Discharge: 2024-03-19 | Disposition: A | Discharge status: Home / Self Care | Source: Ambulatory Visit | Attending: Oncology | Admitting: Oncology

## 2024-03-19 DIAGNOSIS — Z853 Personal history of malignant neoplasm of breast: Secondary | ICD-10-CM | POA: Diagnosis not present

## 2024-03-19 DIAGNOSIS — Z08 Encounter for follow-up examination after completed treatment for malignant neoplasm: Secondary | ICD-10-CM | POA: Insufficient documentation

## 2024-03-19 DIAGNOSIS — Z1231 Encounter for screening mammogram for malignant neoplasm of breast: Secondary | ICD-10-CM | POA: Diagnosis present

## 2024-03-19 DIAGNOSIS — Z170 Estrogen receptor positive status [ER+]: Secondary | ICD-10-CM | POA: Insufficient documentation

## 2024-03-19 DIAGNOSIS — C50411 Malignant neoplasm of upper-outer quadrant of right female breast: Secondary | ICD-10-CM | POA: Insufficient documentation

## 2024-03-27 ENCOUNTER — Inpatient Hospital Stay: Attending: Oncology | Admitting: Oncology

## 2024-03-27 ENCOUNTER — Encounter: Payer: Self-pay | Admitting: Oncology

## 2024-03-27 VITALS — BP 138/95 | HR 96 | Temp 98.6°F | Resp 19 | Wt 180.5 lb

## 2024-03-27 DIAGNOSIS — Z170 Estrogen receptor positive status [ER+]: Secondary | ICD-10-CM | POA: Insufficient documentation

## 2024-03-27 DIAGNOSIS — Z79811 Long term (current) use of aromatase inhibitors: Secondary | ICD-10-CM | POA: Diagnosis not present

## 2024-03-27 DIAGNOSIS — Z1721 Progesterone receptor positive status: Secondary | ICD-10-CM | POA: Diagnosis not present

## 2024-03-27 DIAGNOSIS — Z1731 Human epidermal growth factor receptor 2 positive status: Secondary | ICD-10-CM | POA: Insufficient documentation

## 2024-03-27 DIAGNOSIS — C50411 Malignant neoplasm of upper-outer quadrant of right female breast: Secondary | ICD-10-CM | POA: Diagnosis present

## 2024-03-27 NOTE — Progress Notes (Unsigned)
 [facility]  Telephone:([phone]) [phone] [phone]) [phone]  ID: Kimberly Clarke Public OB: June 01, 1955  MR#: [id number]  RDW#:[id number]  Patient Care Team: Fredirick Glenys RAMAN, MD as PCP - General (Obstetrics and Gynecology) Claudene Larinda Bolder, MD (Inactive) as Referring Physician (Surgery) Jacobo Evalene PARAS, MD as Consulting Physician (Oncology) Maurie Rayfield BIRCH, RN as Registered Nurse Sindy, Frederic Pac, MD as Referring Physician (Radiation Oncology)   CHIEF COMPLAINT: Pathologic stage IIa ER/PR positive, HER-2 negative invasive carcinoma of the upper outer quadrant of the right breast.  INTERVAL HISTORY: Patient returns to clinic today for routine 49-month evaluation.  She continues to feel well and remains asymptomatic.  She is tolerating anastrozole  without significant side effects. She has no neurologic complaints.  She denies any recent fevers or illnesses.  She has a good appetite.  She denies any chest pain, shortness of breath, cough, or hemoptysis.  She denies any nausea, vomiting, constipation, or diarrhea. She has no urinary complaints.  Patient offers no specific complaints today.  REVIEW OF SYSTEMS:   Review of Systems  Constitutional: Negative.  Negative for fever, malaise/fatigue and weight loss.  HENT:  Negative for congestion and sinus pain.   Respiratory: Negative.  Negative for cough and shortness of breath.   Cardiovascular: Negative.  Negative for chest pain and leg swelling.  Gastrointestinal:  Negative for abdominal pain, constipation, diarrhea, nausea and vomiting.  Genitourinary: Negative.  Negative for dysuria.  Musculoskeletal: Negative.  Negative for joint pain.  Skin: Negative.  Negative for rash.  Neurological: Negative.  Negative for tingling, sensory change and weakness.  Psychiatric/Behavioral: Negative.  Negative for depression. The patient is not nervous/anxious and does not have insomnia.     As per HPI. Otherwise, a complete review of  systems is negative.  PAST MEDICAL HISTORY: Past Medical History:  Diagnosis Date  . Anxiety   . Cancer (HCC) 04/24/2016   right breast.  chemo finished 09/2016  . History of hiatal hernia   . HSV infection    HISTORY OF HSV  . Osteopenia   . Personal history of chemotherapy   . Personal history of radiation therapy   . PONV (postoperative nausea and vomiting)    nausea no vomiting  . Post-menopausal 10/20/2005  . Postmenopause bleeding   . Spasmodic dysphonia   . Thickened endometrium 05/30/2016    PAST SURGICAL HISTORY: Past Surgical History:  Procedure Laterality Date  . ANAL FISTULECTOMY    . BREAST BIOPSY Left 02/05/2019   Stereo, Pennie,  benign  . BREAST LUMPECTOMY Right 11/30/2016   lumpectomy with rad and neoadj chemo  . DILATATION & CURETTAGE/HYSTEROSCOPY WITH MYOSURE N/A 01/05/2022   Procedure: DILATATION & CURETTAGE/HYSTEROSCOPY WITH MYOSURE;  Surgeon: Fredirick Glenys RAMAN, MD;  Location[facility] OR;  Service: Gynecology;  Laterality: N/A;  . ENDOMETRIAL BIOPSY    . KNEE SURGERY     snow skating  . PARTIAL MASTECTOMY WITH NEEDLE LOCALIZATION Right 11/30/2016   Procedure: PARTIAL MASTECTOMY WITH NEEDLE LOCALIZATION;  Surgeon: Claudene Larinda Bolder, MD;  Location: [facility] ORS;  Service: General;  Laterality: Right;  . PORTACATH PLACEMENT N/A 05/23/2016   Procedure: INSERTION PORT-A-CATH;  Surgeon: Larinda Bolder Claudene, MD;  Location: [facility] ORS;  Service: General;  Laterality: N/A;  . removal of port a cath     01/2018  . SENTINEL NODE BIOPSY Right 11/30/2016   Procedure: SENTINEL NODE BIOPSY;  Surgeon: Claudene Larinda Bolder, MD;  Location: [facility] ORS;  Service: General;  Laterality: Right;    FAMILY HISTORY: Family  History  Problem Relation Age of Onset  . Cancer Mother        BREAST AND BONES  . Breast cancer Mother 70  . Hearing loss Mother   . Emphysema Father   . Cancer Father   . Alcohol abuse Father   . Cancer Maternal Aunt   . Hearing loss Maternal Grandmother   .  Hypertension Sister   . Miscarriages / Stillbirths Sister     ADVANCED DIRECTIVES (Y/N):  N  HEALTH MAINTENANCE: Social History   Tobacco Use  . Smoking status: Never  . Smokeless tobacco: Never  Vaping Use  . Vaping status: Never Used  Substance Use Topics  . Alcohol use: Yes    Alcohol/week: 6.0 - 8.0 standard drinks of alcohol    Types: 3 - 4 Glasses of wine, 3 - 4 Cans of beer per week    Comment: WEEKENDS OCC  . Drug use: No     Colonoscopy:  PAP:  Bone density:  Lipid panel:  Allergies  Allergen Reactions  . Penicillins Anaphylaxis    Per pt rxn as kid ? Unsure of reaction     Current Outpatient Medications  Medication Sig Dispense Refill  . ALPRAZolam  (XANAX ) 0.25 MG tablet Take 0.5 tablets (0.125 mg total) by mouth 2 (two) times daily as needed for anxiety. 30 tablet 0  . BIOTIN PO Take 1 tablet by mouth daily.     . calcium citrate-vitamin D  (CITRACAL+D) 315-200 MG-UNIT tablet Take 2-3 tablets by mouth daily.    . carboxymethylcellulose (REFRESH TEARS) 0.5 % SOLN Place 1 drop into both eyes at bedtime.    . docusate sodium (COLACE) 100 MG capsule Take 200 mg by mouth at bedtime.    . Multiple Vitamins-Minerals (MULTIVITAMIN ADULTS 50+) TABS Take 1 tablet by mouth daily.    . polycarbophil (FIBERCON) 625 MG tablet Take 1,250 mg by mouth at bedtime.     . alendronate  (FOSAMAX ) 70 MG tablet TAKE 1 TABLET (70 MG TOTAL) BY MOUTH EVERY MONDAY. TAKE WITH A FULL GLASS OF WATER ON AN EMPTY STOMACH.FOR OSTEOPENIA. (Patient not taking: Reported on 03/27/2024) 12 tablet 1  . anastrozole  (ARIMIDEX ) 1 MG tablet Take 1 tablet (1 mg total) by mouth daily. (Patient not taking: Reported on 03/27/2024) 90 tablet 0   No current facility-administered medications for this visit.    OBJECTIVE: Vitals:   03/27/24 1445  BP: (!) 138/95  Pulse: 96  Resp: 19  Temp: 98.6 F (37 C)  SpO2: 99%     Body mass index is 26.66 kg/m.    ECOG FS:0 - Asymptomatic  General:  Well-developed, well-nourished, no acute distress. Eyes: Pink conjunctiva, anicteric sclera. HEENT: Normocephalic, moist mucous membranes. Lungs: No audible wheezing or coughing. Heart: Regular rate and rhythm. Abdomen: Soft, nontender, no obvious distention. Musculoskeletal: No edema, cyanosis, or clubbing. Neuro: Alert, answering all questions appropriately. Cranial nerves grossly intact. Skin: No rashes or petechiae noted. Psych: Normal affect.  LAB RESULTS:  Lab Results  Component Value Date   NA 141 09/21/2022   K 3.8 09/21/2022   CL 107 09/21/2022   CO2 26 09/21/2022   GLUCOSE 81 09/21/2022   BUN 13 09/21/2022   CREATININE 0.81 09/21/2022   CALCIUM 9.4 09/21/2022   PROT 7.6 09/21/2022   ALBUMIN 4.1 09/21/2022   AST 26 09/21/2022   ALT 22 09/21/2022   ALKPHOS 87 09/21/2022   BILITOT 0.5 09/21/2022   GFRNONAA >60 09/21/2022   GFRAA >60 10/12/2016  Lab Results  Component Value Date   WBC 6.4 09/21/2022   NEUTROABS 4.6 03/20/2022   HGB 13.5 09/21/2022   HCT 40.5 09/21/2022   MCV 88.0 09/21/2022   PLT 319 09/21/2022     STUDIES: MM 3D SCREENING MAMMOGRAM BILATERAL BREAST Result Date: 03/23/2024 CLINICAL DATA:  Screening. EXAM: DIGITAL SCREENING BILATERAL MAMMOGRAM WITH TOMOSYNTHESIS AND CAD TECHNIQUE: Bilateral screening digital craniocaudal and mediolateral oblique mammograms were obtained. Bilateral screening digital breast tomosynthesis was performed. The images were evaluated with computer-aided detection. COMPARISON:  Previous exam(s). ACR Breast Density Category b: There are scattered areas of fibroglandular density. FINDINGS: There are no findings suspicious for malignancy. IMPRESSION: No mammographic evidence of malignancy. A result letter of this screening mammogram will be mailed directly to the patient. RECOMMENDATION: Screening mammogram in one year. (Code:SM-B-01Y) BI-RADS CATEGORY  1: Negative. Electronically Signed   By: Dina  Arceo M.D.   On:  03/23/2024 12:09    ASSESSMENT: Pathologic stage IIa ER/PR positive, HER-2 negative invasive carcinoma of the upper outer quadrant of the right breast.  PLAN:    Pathologic stage IIa ER/PR positive, HER-2 negative invasive carcinoma of the upper outer quadrant of the right breast: Patient completed her neoadjuvant chemotherapy on Oct 12, 2016. Her lumpectomy was done on November 30, 2016. Patient completed her XRT at [facility] in approximately October 2018.  She could not tolerate letrozole  and was switched to anastrozole .  Given patient's high risk disease she agreed to take 2 additional years of anastrozole  and will complete 7 years of treatment in October 2025.  Her most recent mammogram on March 21, 2023 was reported BI-RADS 1.  Repeat mammogram in October 2025 and if continues to be negative, patient will discontinue anastrozole  at that time.  Return to clinic in 6 months for routine evaluation at which point patient could possibly be discharged from clinic.   Peripheral neuropathy: Patient does not complain of this today. Osteopenia: Patient's most recent bone mineral density on August 13, 2023 reported T-score of -1.9.  This is slightly improved from 1 year prior.  Continue calcium and vitamin D  supplementation.  Repeat DEXA scan in March 2026 which likely can be ordered by primary care.  Patient expressed understanding and was in agreement with this plan. She also understands that She can call clinic at any time with any questions, concerns, or complaints.    Cancer Staging  Malignant neoplasm of upper-outer quadrant of female breast [facility]) Staging form: Breast, AJCC 7th Edition - Clinical stage from 04/26/2016: Stage IIA (T1c, N1, M0) - Signed by Jacobo Evalene PARAS, MD on 05/25/2016 Laterality: Right Estrogen receptor status: Positive Progesterone receptor status: Positive HER2 status: Negative - Pathologic stage from 12/15/2016: Stage IIA (yT1c, yN1a, cM0) - Signed by Jacobo Evalene PARAS, MD on  12/15/2016 Stage prefix: Post-therapy Laterality: Right Estrogen receptor status: Positive Progesterone receptor status: Positive HER2 status: Negative    Evalene PARAS Jacobo, MD 03/27/24 3:10 PM

## 2024-06-20 ENCOUNTER — Encounter: Payer: Self-pay | Admitting: Emergency Medicine

## 2024-06-20 ENCOUNTER — Emergency Department
Admission: EM | Admit: 2024-06-20 | Discharge: 2024-06-20 | Disposition: A | Discharge status: Home / Self Care | Attending: Emergency Medicine | Admitting: Emergency Medicine

## 2024-06-20 DIAGNOSIS — R5383 Other fatigue: Secondary | ICD-10-CM | POA: Insufficient documentation

## 2024-06-20 DIAGNOSIS — F419 Anxiety disorder, unspecified: Secondary | ICD-10-CM | POA: Diagnosis present

## 2024-06-20 LAB — CBC
HCT: 44.8 % (ref 36.0–46.0)
Hemoglobin: 14.6 g/dL (ref 12.0–15.0)
MCH: 28.6 pg (ref 26.0–34.0)
MCHC: 32.6 g/dL (ref 30.0–36.0)
MCV: 87.7 fL (ref 80.0–100.0)
Platelets: 377 10*3/uL (ref 150–400)
RBC: 5.11 MIL/uL (ref 3.87–5.11)
RDW: 12.6 % (ref 11.5–15.5)
WBC: 8.5 10*3/uL (ref 4.0–10.5)
nRBC: 0 % (ref 0.0–0.2)

## 2024-06-20 LAB — COMPREHENSIVE METABOLIC PANEL WITH GFR
ALT: 20 U/L (ref 0–44)
AST: 24 U/L (ref 15–41)
Albumin: 4.7 g/dL (ref 3.5–5.0)
Alkaline Phosphatase: 121 U/L (ref 38–126)
Anion gap: 13 (ref 5–15)
BUN: 12 mg/dL (ref 8–23)
CO2: 23 mmol/L (ref 22–32)
Calcium: 9.7 mg/dL (ref 8.9–10.3)
Chloride: 105 mmol/L (ref 98–111)
Creatinine, Ser: 0.8 mg/dL (ref 0.44–1.00)
GFR, Estimated: >60 mL/min
Glucose, Bld: 88 mg/dL (ref 70–99)
Potassium: 3.8 mmol/L (ref 3.5–5.1)
Sodium: 142 mmol/L (ref 135–145)
Total Bilirubin: 0.4 mg/dL (ref 0.0–1.2)
Total Protein: 8 g/dL (ref 6.5–8.1)

## 2024-06-20 LAB — URINALYSIS, ROUTINE W REFLEX MICROSCOPIC
Bilirubin Urine: NEGATIVE
Glucose, UA: NEGATIVE mg/dL
Ketones, ur: NEGATIVE mg/dL
Leukocytes,Ua: NEGATIVE
Nitrite: NEGATIVE
Protein, ur: NEGATIVE mg/dL
Specific Gravity, Urine: 1.006 (ref 1.005–1.030)
pH: 7 (ref 5.0–8.0)

## 2024-06-20 LAB — TSH: TSH: 2.94 u[IU]/mL (ref 0.350–4.500)

## 2024-06-20 MED ORDER — ALPRAZOLAM 0.25 MG PO TABS: 0.1250 mg | ORAL_TABLET | Freq: Two times a day (BID) | ORAL | 0 refills | Status: AC | PRN

## 2024-06-20 NOTE — ED Notes (Signed)
 Patient states she thinks her thyroid  is enlarged, claims no prior history.

## 2024-06-20 NOTE — ED Triage Notes (Signed)
 PT complains of weakness, dizziness and exhaustion x 1 month. Called PCP and was advised to come to ED. Pt states in the last 2 weeks has noticed 2 stools that were darker brown than normal.

## 2024-06-20 NOTE — ED Provider Notes (Signed)
 "  [facility] Provider Note    Event Date/Time   First MD Initiated Contact with Patient 06/20/24 2121     (approximate)   History   Weakness   HPI  Kimberly Clarke is a 69 y.o. female who is a breast cancer survivor who presents with complaints of generalized fatigue weakness over the last 1 to 2 months.  She has been trying to get PCP appointment but not available until March.  She thinks that she may have hypo or hyperthyroid.  Additionally she had some dark stools and when she discussed with nurse on the nurse line and they recommended emergency department evaluation.  She denies abdominal pain.  She also complains of chronic anxiety     Physical Exam   Triage Vital Signs: ED Triage Vitals  Encounter Vitals Group     BP 06/20/24 1930 (!) 135/108     Girls Systolic BP Percentile --      Girls Diastolic BP Percentile --      Boys Systolic BP Percentile --      Boys Diastolic BP Percentile --      Pulse Rate 06/20/24 1928 (!) 109     Resp 06/20/24 1928 18     Temp 06/20/24 1928 98.3 F (36.8 C)     Temp Source 06/20/24 1928 Oral     SpO2 06/20/24 1928 97 %     Weight 06/20/24 2140 82.6 kg (182 lb 1.6 oz)     Height 06/20/24 2140 1.753 m (5' 9)     Head Circumference --      Peak Flow --      Pain Score 06/20/24 1928 0     Pain Loc --      Pain Education --      Exclude from Growth Chart --     Most recent vital signs: Vitals:   06/20/24 2140 06/20/24 2239  BP: (!) 150/88 (!) 155/93  Pulse: 79 82  Resp: 17 17  Temp: 98.3 F (36.8 C) 98.1 F (36.7 C)  SpO2: 100% 100%     General: Awake, no distress.  CV:  Good peripheral perfusion.  Resp:  Normal effort.  Abd:  No distention.  Other:  No goiter, no stridor   ED Results / Procedures / Treatments   Labs (all labs ordered are listed, but only abnormal results are displayed) Labs Reviewed  URINALYSIS, ROUTINE W REFLEX MICROSCOPIC - Abnormal; Notable for the following components:       Result Value   Color, Urine YELLOW (*)    APPearance CLEAR (*)    Hgb urine dipstick SMALL (*)    Bacteria, UA RARE (*)    All other components within normal limits  COMPREHENSIVE METABOLIC PANEL WITH GFR  CBC  TSH     EKG     RADIOLOGY     PROCEDURES:  Critical Care performed:   Procedures   MEDICATIONS ORDERED IN ED: Medications - No data to display   IMPRESSION / MDM / ASSESSMENT AND PLAN / ED COURSE  I reviewed the triage vital signs and the nursing notes. Patient's presentation is most consistent with acute illness / injury with system symptoms.  Patient presents with symptoms as detailed above, ongoing for several months, lab work is reassuring, no evidence of significant anemia.  Will add on TSH.  Electrolytes, kidney function is reassuring.  Will refill her Xanax , place PCP urgent referral        FINAL CLINICAL IMPRESSION(S) /  ED DIAGNOSES   Final diagnoses:  Anxiety  Other fatigue     Rx / DC Orders   ED Discharge Orders          Ordered    ALPRAZolam  (XANAX ) 0.25 MG tablet  2 times daily PRN       Note to Pharmacy: This request is for a new prescription for a controlled substance as required by Federal/State law. DX Code Needed  .   06/20/24 2223    Ambulatory Referral to Primary Care (Establish Care)        06/20/24 2223             Note:  This document was prepared using Dragon voice recognition software and may include unintentional dictation errors.   Arlander Charleston, MD 06/20/24 2253  "
